# Patient Record
Sex: Female | Born: 1935 | Race: White | Hispanic: No
Health system: Southern US, Community
[De-identification: ages and names within clinical notes are randomized; demographics above are authoritative.]

## PROBLEM LIST (undated history)

## (undated) DIAGNOSIS — E785 Hyperlipidemia, unspecified: Secondary | ICD-10-CM

## (undated) DIAGNOSIS — C3490 Malignant neoplasm of unspecified part of unspecified bronchus or lung: Secondary | ICD-10-CM

## (undated) DIAGNOSIS — I4891 Unspecified atrial fibrillation: Secondary | ICD-10-CM

## (undated) DIAGNOSIS — I499 Cardiac arrhythmia, unspecified: Secondary | ICD-10-CM

## (undated) HISTORY — PX: THYROID SURGERY: SHX805

## (undated) HISTORY — DX: Hyperlipidemia, unspecified: E78.5

## (undated) HISTORY — PX: HEMORROIDECTOMY: SUR656

## (undated) HISTORY — DX: Unspecified atrial fibrillation: I48.91

## (undated) HISTORY — PX: INGUINAL HERNIA REPAIR: SHX194

## (undated) HISTORY — DX: Malignant neoplasm of unspecified part of unspecified bronchus or lung: C34.90

---

## 2003-08-20 ENCOUNTER — Observation Stay (HOSPITAL_COMMUNITY): Admission: RE | Admit: 2003-08-20 | Discharge: 2003-08-21 | Payer: Self-pay | Admitting: General Surgery

## 2004-10-07 ENCOUNTER — Ambulatory Visit: Payer: Self-pay | Admitting: Cardiology

## 2004-10-18 ENCOUNTER — Ambulatory Visit: Payer: Self-pay | Admitting: Cardiology

## 2006-10-02 ENCOUNTER — Other Ambulatory Visit: Admission: RE | Admit: 2006-10-02 | Discharge: 2006-10-02 | Payer: Self-pay | Admitting: Family Medicine

## 2007-02-05 ENCOUNTER — Ambulatory Visit (HOSPITAL_COMMUNITY): Admission: RE | Admit: 2007-02-05 | Discharge: 2007-02-05 | Payer: Self-pay | Admitting: Family Medicine

## 2007-03-26 ENCOUNTER — Ambulatory Visit: Payer: Self-pay | Admitting: Internal Medicine

## 2007-07-11 ENCOUNTER — Ambulatory Visit (HOSPITAL_COMMUNITY): Admission: RE | Admit: 2007-07-11 | Discharge: 2007-07-11 | Payer: Self-pay | Admitting: Family Medicine

## 2007-07-12 ENCOUNTER — Telehealth (INDEPENDENT_AMBULATORY_CARE_PROVIDER_SITE_OTHER): Payer: Self-pay | Admitting: *Deleted

## 2007-07-13 ENCOUNTER — Ambulatory Visit: Payer: Self-pay | Admitting: Internal Medicine

## 2007-07-13 LAB — CONVERTED CEMR LAB
ALT: 18 units/L (ref 0–35)
AST: 21 units/L (ref 0–37)
Albumin: 4 g/dL (ref 3.5–5.2)
Bilirubin, Direct: 0.1 mg/dL (ref 0.0–0.3)
Calcium: 10.1 mg/dL (ref 8.4–10.5)
Chloride: 102 meq/L (ref 96–112)
Creatinine, Ser: 0.6 mg/dL (ref 0.4–1.2)
Eosinophils Absolute: 0.1 10*3/uL (ref 0.0–0.6)
Eosinophils Relative: 2.2 % (ref 0.0–5.0)
GFR calc non Af Amer: 101 mL/min
Glucose, Bld: 101 mg/dL — ABNORMAL HIGH (ref 70–99)
HCT: 39.5 % (ref 36.0–46.0)
INR: 0.9 (ref 0.8–1.0)
MCV: 92 fL (ref 78.0–100.0)
Platelets: 203 10*3/uL (ref 150–400)
Prothrombin Time: 11.4 s (ref 10.9–13.3)
RBC: 4.29 M/uL (ref 3.87–5.11)
RDW: 12.8 % (ref 11.5–14.6)
Total Bilirubin: 0.8 mg/dL (ref 0.3–1.2)
WBC: 6.8 10*3/uL (ref 4.5–10.5)

## 2007-07-24 ENCOUNTER — Ambulatory Visit (HOSPITAL_COMMUNITY): Admission: RE | Admit: 2007-07-24 | Discharge: 2007-07-24 | Payer: Self-pay | Admitting: Internal Medicine

## 2007-07-24 ENCOUNTER — Encounter (INDEPENDENT_AMBULATORY_CARE_PROVIDER_SITE_OTHER): Payer: Self-pay | Admitting: Diagnostic Radiology

## 2007-07-24 DIAGNOSIS — C3410 Malignant neoplasm of upper lobe, unspecified bronchus or lung: Secondary | ICD-10-CM

## 2007-07-28 DIAGNOSIS — K449 Diaphragmatic hernia without obstruction or gangrene: Secondary | ICD-10-CM | POA: Insufficient documentation

## 2007-07-28 DIAGNOSIS — J309 Allergic rhinitis, unspecified: Secondary | ICD-10-CM | POA: Insufficient documentation

## 2007-07-31 DIAGNOSIS — K5730 Diverticulosis of large intestine without perforation or abscess without bleeding: Secondary | ICD-10-CM | POA: Insufficient documentation

## 2007-07-31 DIAGNOSIS — D500 Iron deficiency anemia secondary to blood loss (chronic): Secondary | ICD-10-CM

## 2007-07-31 DIAGNOSIS — M818 Other osteoporosis without current pathological fracture: Secondary | ICD-10-CM

## 2007-08-06 ENCOUNTER — Telehealth: Payer: Self-pay | Admitting: Internal Medicine

## 2007-08-07 ENCOUNTER — Ambulatory Visit: Payer: Self-pay | Admitting: Internal Medicine

## 2007-08-07 LAB — CONVERTED CEMR LAB
Eosinophils Absolute: 0.2 10*3/uL (ref 0.0–0.6)
Eosinophils Relative: 2.4 % (ref 0.0–5.0)
GFR calc Af Amer: 103 mL/min
GFR calc non Af Amer: 85 mL/min
Glucose, Bld: 99 mg/dL (ref 70–99)
HCT: 37.8 % (ref 36.0–46.0)
Lymphocytes Relative: 33.9 % (ref 12.0–46.0)
MCHC: 34.8 g/dL (ref 30.0–36.0)
MCV: 90.3 fL (ref 78.0–100.0)
Neutro Abs: 3.6 10*3/uL (ref 1.4–7.7)
Neutrophils Relative %: 54.3 % (ref 43.0–77.0)
Potassium: 4.1 meq/L (ref 3.5–5.1)
Sodium: 140 meq/L (ref 135–145)
WBC: 6.8 10*3/uL (ref 4.5–10.5)

## 2007-08-13 ENCOUNTER — Ambulatory Visit (HOSPITAL_COMMUNITY): Admission: RE | Admit: 2007-08-13 | Discharge: 2007-08-13 | Payer: Self-pay | Admitting: Internal Medicine

## 2007-08-15 ENCOUNTER — Ambulatory Visit: Payer: Self-pay | Admitting: Internal Medicine

## 2007-08-23 ENCOUNTER — Ambulatory Visit: Payer: Self-pay | Admitting: Thoracic Surgery

## 2007-08-31 ENCOUNTER — Ambulatory Visit: Admission: RE | Admit: 2007-08-31 | Discharge: 2007-10-11 | Payer: Self-pay | Admitting: Radiation Oncology

## 2007-09-03 ENCOUNTER — Encounter: Payer: Self-pay | Admitting: Internal Medicine

## 2007-09-11 ENCOUNTER — Inpatient Hospital Stay (HOSPITAL_COMMUNITY): Admission: RE | Admit: 2007-09-11 | Discharge: 2007-09-17 | Payer: Self-pay | Admitting: Thoracic Surgery

## 2007-09-11 ENCOUNTER — Encounter: Payer: Self-pay | Admitting: Internal Medicine

## 2007-09-11 ENCOUNTER — Ambulatory Visit: Payer: Self-pay | Admitting: Thoracic Surgery

## 2007-09-11 ENCOUNTER — Encounter: Payer: Self-pay | Admitting: Thoracic Surgery

## 2007-09-11 DIAGNOSIS — C3490 Malignant neoplasm of unspecified part of unspecified bronchus or lung: Secondary | ICD-10-CM

## 2007-09-11 HISTORY — PX: LUNG LOBECTOMY: SHX167

## 2007-09-11 HISTORY — DX: Malignant neoplasm of unspecified part of unspecified bronchus or lung: C34.90

## 2007-09-20 ENCOUNTER — Encounter: Admission: RE | Admit: 2007-09-20 | Discharge: 2007-09-20 | Payer: Self-pay | Admitting: Thoracic Surgery

## 2007-09-20 ENCOUNTER — Ambulatory Visit: Payer: Self-pay | Admitting: Thoracic Surgery

## 2007-10-04 ENCOUNTER — Encounter: Admission: RE | Admit: 2007-10-04 | Discharge: 2007-10-04 | Payer: Self-pay | Admitting: Thoracic Surgery

## 2007-10-04 ENCOUNTER — Ambulatory Visit: Payer: Self-pay | Admitting: Thoracic Surgery

## 2007-10-04 ENCOUNTER — Encounter: Payer: Self-pay | Admitting: Internal Medicine

## 2007-10-05 ENCOUNTER — Ambulatory Visit: Payer: Self-pay | Admitting: Internal Medicine

## 2007-10-09 LAB — CBC WITH DIFFERENTIAL/PLATELET
BASO%: 0.4 % (ref 0.0–2.0)
Basophils Absolute: 0 10e3/uL (ref 0.0–0.1)
EOS%: 9.1 % — ABNORMAL HIGH (ref 0.0–7.0)
Eosinophils Absolute: 0.8 10e3/uL — ABNORMAL HIGH (ref 0.0–0.5)
HCT: 36.6 % (ref 34.8–46.6)
HGB: 12.6 g/dL (ref 11.6–15.9)
LYMPH%: 15.1 % (ref 14.0–48.0)
MCH: 30.9 pg (ref 26.0–34.0)
MCHC: 34.5 g/dL (ref 32.0–36.0)
MCV: 89.5 fL (ref 81.0–101.0)
MONO#: 0.7 10e3/uL (ref 0.1–0.9)
MONO%: 7.6 % (ref 0.0–13.0)
NEUT#: 6.1 10e3/uL (ref 1.5–6.5)
NEUT%: 67.8 % (ref 39.6–76.8)
Platelets: 253 10e3/uL (ref 145–400)
RBC: 4.09 10e6/uL (ref 3.70–5.32)
RDW: 13.6 % (ref 11.3–14.5)
WBC: 8.9 10e3/uL (ref 3.9–10.0)
lymph#: 1.3 10e3/uL (ref 0.9–3.3)

## 2007-10-09 LAB — COMPREHENSIVE METABOLIC PANEL
ALT: <8 U/L (ref 0–35)
AST: 9 U/L (ref 0–37)
Albumin: 3.5 g/dL (ref 3.5–5.2)
Alkaline Phosphatase: 81 U/L (ref 39–117)
BUN: 10 mg/dL (ref 6–23)
Calcium: 8.9 mg/dL (ref 8.4–10.5)
Chloride: 102 mEq/L (ref 96–112)
Potassium: 4.1 mEq/L (ref 3.5–5.3)
Sodium: 136 mEq/L (ref 135–145)
Total Protein: 6.2 g/dL (ref 6.0–8.3)

## 2007-11-01 ENCOUNTER — Ambulatory Visit: Payer: Self-pay | Admitting: Thoracic Surgery

## 2007-11-01 ENCOUNTER — Encounter: Admission: RE | Admit: 2007-11-01 | Discharge: 2007-11-01 | Payer: Self-pay | Admitting: Thoracic Surgery

## 2007-12-12 ENCOUNTER — Ambulatory Visit: Payer: Self-pay | Admitting: Thoracic Surgery

## 2007-12-12 ENCOUNTER — Encounter: Admission: RE | Admit: 2007-12-12 | Discharge: 2007-12-12 | Payer: Self-pay | Admitting: Thoracic Surgery

## 2008-01-31 ENCOUNTER — Encounter: Admission: RE | Admit: 2008-01-31 | Discharge: 2008-01-31 | Payer: Self-pay | Admitting: Thoracic Surgery

## 2008-01-31 ENCOUNTER — Ambulatory Visit: Payer: Self-pay | Admitting: Thoracic Surgery

## 2008-04-01 ENCOUNTER — Ambulatory Visit: Payer: Self-pay | Admitting: Internal Medicine

## 2008-04-03 LAB — CBC WITH DIFFERENTIAL/PLATELET
Basophils Absolute: 0 10*3/uL (ref 0.0–0.1)
EOS%: 4.6 % (ref 0.0–7.0)
Eosinophils Absolute: 0.3 10*3/uL (ref 0.0–0.5)
HGB: 13 g/dL (ref 11.6–15.9)
NEUT#: 3.5 10*3/uL (ref 1.5–6.5)
RBC: 4.17 10*6/uL (ref 3.70–5.32)
RDW: 14.5 % (ref 11.3–14.5)
lymph#: 2.3 10*3/uL (ref 0.9–3.3)

## 2008-04-03 LAB — COMPREHENSIVE METABOLIC PANEL
AST: 15 U/L (ref 0–37)
Albumin: 4.1 g/dL (ref 3.5–5.2)
BUN: 12 mg/dL (ref 6–23)
Calcium: 9.4 mg/dL (ref 8.4–10.5)
Chloride: 103 mEq/L (ref 96–112)
Glucose, Bld: 96 mg/dL (ref 70–99)
Potassium: 4 mEq/L (ref 3.5–5.3)
Sodium: 137 mEq/L (ref 135–145)
Total Protein: 6.8 g/dL (ref 6.0–8.3)

## 2008-04-07 ENCOUNTER — Ambulatory Visit (HOSPITAL_COMMUNITY): Admission: RE | Admit: 2008-04-07 | Discharge: 2008-04-07 | Payer: Self-pay | Admitting: Internal Medicine

## 2008-04-16 ENCOUNTER — Ambulatory Visit: Payer: Self-pay | Admitting: Thoracic Surgery

## 2008-04-24 ENCOUNTER — Ambulatory Visit (HOSPITAL_COMMUNITY): Admission: RE | Admit: 2008-04-24 | Discharge: 2008-04-24 | Payer: Self-pay | Admitting: Internal Medicine

## 2008-05-21 ENCOUNTER — Encounter: Admission: RE | Admit: 2008-05-21 | Discharge: 2008-05-21 | Payer: Self-pay | Admitting: Internal Medicine

## 2008-05-21 ENCOUNTER — Encounter (INDEPENDENT_AMBULATORY_CARE_PROVIDER_SITE_OTHER): Payer: Self-pay | Admitting: Interventional Radiology

## 2008-05-21 ENCOUNTER — Other Ambulatory Visit: Admission: RE | Admit: 2008-05-21 | Discharge: 2008-05-21 | Payer: Self-pay | Admitting: Interventional Radiology

## 2008-07-08 ENCOUNTER — Encounter: Payer: Self-pay | Admitting: Internal Medicine

## 2008-08-19 ENCOUNTER — Ambulatory Visit (HOSPITAL_COMMUNITY): Admission: RE | Admit: 2008-08-19 | Discharge: 2008-08-20 | Payer: Self-pay | Admitting: General Surgery

## 2008-08-19 ENCOUNTER — Encounter (INDEPENDENT_AMBULATORY_CARE_PROVIDER_SITE_OTHER): Payer: Self-pay | Admitting: General Surgery

## 2008-10-01 ENCOUNTER — Ambulatory Visit: Payer: Self-pay | Admitting: Internal Medicine

## 2008-10-03 ENCOUNTER — Ambulatory Visit (HOSPITAL_COMMUNITY): Admission: RE | Admit: 2008-10-03 | Discharge: 2008-10-03 | Payer: Self-pay | Admitting: Internal Medicine

## 2008-10-03 LAB — CBC WITH DIFFERENTIAL/PLATELET
BASO%: 0.3 % (ref 0.0–2.0)
EOS%: 1 % (ref 0.0–7.0)
HCT: 41 % (ref 34.8–46.6)
LYMPH%: 30.5 % (ref 14.0–49.7)
MCH: 31.3 pg (ref 25.1–34.0)
MCHC: 34 g/dL (ref 31.5–36.0)
NEUT%: 60.8 % (ref 38.4–76.8)
Platelets: 214 10*3/uL (ref 145–400)

## 2008-10-03 LAB — COMPREHENSIVE METABOLIC PANEL
Albumin: 4.4 g/dL (ref 3.5–5.2)
BUN: 10 mg/dL (ref 6–23)
CO2: 26 mEq/L (ref 19–32)
Calcium: 9.6 mg/dL (ref 8.4–10.5)
Chloride: 104 mEq/L (ref 96–112)
Glucose, Bld: 94 mg/dL (ref 70–99)
Potassium: 4.1 mEq/L (ref 3.5–5.3)

## 2008-10-07 ENCOUNTER — Ambulatory Visit: Payer: Self-pay | Admitting: Thoracic Surgery

## 2009-04-02 ENCOUNTER — Ambulatory Visit: Payer: Self-pay | Admitting: Internal Medicine

## 2009-04-06 ENCOUNTER — Ambulatory Visit (HOSPITAL_COMMUNITY): Admission: RE | Admit: 2009-04-06 | Discharge: 2009-04-06 | Payer: Self-pay | Admitting: Internal Medicine

## 2009-04-06 LAB — COMPREHENSIVE METABOLIC PANEL
ALT: 12 U/L (ref 0–35)
AST: 18 U/L (ref 0–37)
Calcium: 9 mg/dL (ref 8.4–10.5)
Chloride: 108 mEq/L (ref 96–112)
Creatinine, Ser: 0.55 mg/dL (ref 0.40–1.20)
Sodium: 141 mEq/L (ref 135–145)
Total Protein: 6.3 g/dL (ref 6.0–8.3)

## 2009-04-06 LAB — CBC WITH DIFFERENTIAL/PLATELET
BASO%: 0.6 % (ref 0.0–2.0)
EOS%: 1.7 % (ref 0.0–7.0)
HCT: 38.5 % (ref 34.8–46.6)
MCH: 31.1 pg (ref 25.1–34.0)
MCHC: 34.5 g/dL (ref 31.5–36.0)
NEUT%: 57.3 % (ref 38.4–76.8)
RBC: 4.27 10*6/uL (ref 3.70–5.45)
RDW: 13.7 % (ref 11.2–14.5)
WBC: 5.2 10*3/uL (ref 3.9–10.3)
lymph#: 1.7 10*3/uL (ref 0.9–3.3)

## 2009-04-14 ENCOUNTER — Ambulatory Visit: Payer: Self-pay | Admitting: Thoracic Surgery

## 2009-09-15 ENCOUNTER — Encounter: Payer: Self-pay | Admitting: Cardiology

## 2009-09-29 ENCOUNTER — Ambulatory Visit: Payer: Self-pay | Admitting: Internal Medicine

## 2009-10-01 ENCOUNTER — Ambulatory Visit (HOSPITAL_COMMUNITY): Admission: RE | Admit: 2009-10-01 | Discharge: 2009-10-01 | Payer: Self-pay | Admitting: Internal Medicine

## 2009-10-01 LAB — CBC WITH DIFFERENTIAL/PLATELET
Basophils Absolute: 0 10*3/uL (ref 0.0–0.1)
Eosinophils Absolute: 0.1 10*3/uL (ref 0.0–0.5)
HGB: 13.8 g/dL (ref 11.6–15.9)
LYMPH%: 34.1 % (ref 14.0–49.7)
MCV: 93.7 fL (ref 79.5–101.0)
MONO#: 0.5 10*3/uL (ref 0.1–0.9)
NEUT#: 3.6 10*3/uL (ref 1.5–6.5)
Platelets: 211 10*3/uL (ref 145–400)
RBC: 4.31 10*6/uL (ref 3.70–5.45)
RDW: 13.5 % (ref 11.2–14.5)
WBC: 6.4 10*3/uL (ref 3.9–10.3)

## 2009-10-01 LAB — COMPREHENSIVE METABOLIC PANEL
Albumin: 4.5 g/dL (ref 3.5–5.2)
BUN: 10 mg/dL (ref 6–23)
CO2: 25 mEq/L (ref 19–32)
Glucose, Bld: 97 mg/dL (ref 70–99)
Potassium: 4.4 mEq/L (ref 3.5–5.3)
Sodium: 139 mEq/L (ref 135–145)
Total Protein: 7.1 g/dL (ref 6.0–8.3)

## 2010-02-03 ENCOUNTER — Encounter: Payer: Self-pay | Admitting: Cardiology

## 2010-02-05 ENCOUNTER — Ambulatory Visit: Payer: Self-pay | Admitting: Cardiology

## 2010-02-05 DIAGNOSIS — I4891 Unspecified atrial fibrillation: Secondary | ICD-10-CM

## 2010-03-17 ENCOUNTER — Ambulatory Visit: Payer: Self-pay | Admitting: Cardiology

## 2010-08-27 ENCOUNTER — Other Ambulatory Visit: Payer: Self-pay | Admitting: Internal Medicine

## 2010-08-27 DIAGNOSIS — C3490 Malignant neoplasm of unspecified part of unspecified bronchus or lung: Secondary | ICD-10-CM

## 2010-08-29 ENCOUNTER — Encounter: Payer: Self-pay | Admitting: Thoracic Surgery

## 2010-09-07 NOTE — Assessment & Plan Note (Signed)
Summary: np6/ new onset of afib,/ f/u back up in [place]. pt has Charity fundraiser...   Visit Type:  Initial Consult Primary Provider:  Vernon Prey  CC:  Atrial Fibrillation.  History of Present Illness: The patient presents for evaluation of atrial fibrillation. This is a new diagnosis although I do see mention of postoperative atrial fibrillation after lung surgery a few years ago. She has never been told that she's been in this rhythm otherwise. She was noted to have this on a recent EKG. She does feel some palpitations. She feels her heart racing if she's working in her yard for instance and she has to stop what she is doing and went to calm down. She has not had any presyncope or syncope. She has chronic dyspnea since a lung resection and isn't sure that this is any worse. She does have some back pain under her left scapula which is new and exacerbated with movement. However, she has not had any chest pressure, neck or arm discomfort. She has not had any nausea vomiting or diaphoresis.  Preventive Screening-Counseling & Management  Alcohol-Tobacco     Smoking Status: never  Current Medications (verified): 1)  Symbicort 160-4.5 Mcg/act Aero (Budesonide-Formoterol Fumarate) .... 2 Puffs Two Times A Day 2)  Caltrate 600+d 600-400 Mg-Unit Tabs (Calcium Carbonate-Vitamin D) .... Take 1 Tablet By Mouth Two Times A Day 3)  Omeprazole 20 Mg Cpdr (Omeprazole) .... Take 1 Tablet By Mouth Once A Day 4)  Lipitor 10 Mg Tabs (Atorvastatin Calcium) .... Take 1/2 Tablet By Mouth Once A Day 5)  Singulair 10 Mg  Tabs (Montelukast Sodium) .... Take 1 Tablet By Mouth Once A Day 6)  Vitamin D 1000 Unit Tabs (Cholecalciferol) .... Take 1 Tablet By Mouth Two Times A Day 7)  Systane 0.4-0.3 % Soln (Polyethyl Glycol-Propyl Glycol) .... One Drop Ou Two Times A Day 8)  Sm Omega-3 Fish Oil 1200 Mg Caps (Omega-3 Fatty Acids) .... Take 2 Tablet By Mouth Two Times A Day 9)  Ra Iron 27 Mg Tabs (Ferrous Sulfate) .... Take 1 Tablet  By Mouth Once A Day 10)  Levothyroxine Sodium 50 Mcg Tabs (Levothyroxine Sodium) .... Take 1 Tablet By Mouth Once A Day 11)  Folic Acid 400 Mcg Tabs (Folic Acid) .... Take 1 Tablet By Mouth Once A Day 12)  Claritin 10 Mg Tabs (Loratadine) .... Take 1 Tablet By Mouth Once A Day As Needed 13)  Tylenol Extra Strength 500 Mg Tabs (Acetaminophen) .... As Needed 14)  Mucinex Dm 30-600 Mg Xr12h-Tab (Dextromethorphan-Guaifenesin) .... As Needed 15)  Aspir-Low 81 Mg Tbec (Aspirin) .... Take 1 Tablet By Mouth Once A Day  Allergies (verified): No Known Drug Allergies  Comments:  Nurse/Medical Assistant: The patient's medication list and allergies were reviewed with the patient and were updated in the Medication and Allergy Lists.  Past History:  Past Medical History: Last updated: 08/07/2007 Lung Cancer (07/24/2007) needle bx NSCCa RUL Allergic Rhinitis Hernia surgery R lower quad  Past Surgical History: Hernia right inguinal Thyroid nodule resected Right video-assisted thoracic surgery, Right upper lobectomy. (09/2007)      Family History: Father age 65- MVA Mother - Age 42  Review of Systems       As stated in the HPI and negative for all other systems.   Vital Signs:  Patient profile:   77 year old female Height:      60 inches Weight:      125 pounds BMI:     24.50 O2  Sat:      99 % on Room air Pulse rate:   83 / minute BP sitting:   132 / 76  (left arm) Cuff size:   regular  Vitals Entered By: Carlye Grippe (February 05, 2010 2:42 PM)  O2 Flow:  Room air CC: Atrial Fibrillation   Physical Exam  General:  Well developed, well nourished, in no acute distress. Head:  normocephalic and atraumatic Eyes:  PERRLA/EOM intact; conjunctiva and lids normal. Mouth:  Poor dentition, otherwise unremarkable. Neck:  Neck supple, no JVD. No masses, thyromegaly or abnormal cervical nodes. Chest Wall:  no deformities or breast masses noted Lungs:  Clear bilaterally to auscultation  and percussion. Abdomen:  Bowel sounds positive; abdomen soft and non-tender without masses, organomegaly, or hernias noted. No hepatosplenomegaly. Msk:  Back normal, normal gait. Muscle strength and tone normal. Extremities:  No clubbing or cyanosis. Neurologic:  Alert and oriented x 3. Skin:  Intact without lesions or rashes. Cervical Nodes:  no significant adenopathy Axillary Nodes:  no significant adenopathy Inguinal Nodes:  no significant adenopathy Psych:  Normal affect.   Detailed Cardiovascular Exam  Neck    Carotids: Carotids full and equal bilaterally without bruits.      Neck Veins: Normal, no JVD.    Heart    Inspection: no deformities or lifts noted.      Auscultation: S1 and S2 within normal limits, no S3,  no clicks, no rubs, no murmurs, irregular  Vascular    Abdominal Aorta: no palpable masses, pulsations, or audible bruits.      Femoral Pulses: normal femoral pulses bilaterally.      Pedal Pulses: normal pedal pulses bilaterally.      Radial Pulses: normal radial pulses bilaterally.      Peripheral Circulation: no clubbing, cyanosis, or edema noted with normal capillary refill.     EKG  Procedure date:  03/05/2010  Findings:      Atrial fibrillation, axis within normal limits, intervals within normal limits, rate 92  Impression & Recommendations:  Problem # 1:  ATRIAL FIBRILLATION (ICD-427.31) The patient has this rhythm and does seem to feel it. I have discussed with her and her daughter at great length the risk of stroke and the need for anticoagulation and I will start her on Coumadin. She will get 2.5 mg every night and be checked on Tuesday at [facility]. I will plan cardioversion after she has had therapeutic INRs for 4 weeks in a row.  I will also apply a 48 hour Holter monitor to make sure this is persistent not paroxysmal fibrillation. Orders: Holter Monitor (Holter Monitor)  Problem # 2:  Sx of DYSPNEA (ICD-786.05)  I suspect her  dyspnea is related to her lung resection but could not exclude a contribution from atrial fibrillation.  The following medications were removed from the medication list:    Aspir-low 81 Mg Tbec (Aspirin) .[name]... 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  Patient Instructions: 1)  Follow up with Dr. Antoine Poche in [place] on March 17, 2010 at 2:45pm. 2)  Start Warfarin 2.5mg  today. Take 1 tablet every afternoon around 4-5 pm. 3)  Go do Dr. Kathi Der office for a coumadin check on Tuesday, July 5th at 8:30am with Montey Hora. 4)  Stop Aspirin (if you take this). 5)  Your physician has recommended that you wear a holter monitor.  Holter monitors are medical devices that record the heart's electrical activity. Doctors most often use these monitors to diagnose arrhythmias. Arrhythmias are problems with the speed or rhythm of the heartbeat. The monitor is a small, portable device. You can wear one while you do your normal daily activities. This is usually used to diagnose what is causing palpitations/syncope (passing out). GO TO DR. Kathi Der OFFICE ON JULY 19TH TO HAVE THIS PLACED. GO SOMETIME BETWEEN 9:30 AT 12. Prescriptions: WARFARIN SODIUM 2.5 MG TABS (WARFARIN SODIUM) Use as directed by Anticoagualtion Clinic with Dr. Christell Constant  #45 x 1   Entered by:   Cyril Loosen, RN, BSN   Authorized by:   Rollene Rotunda, MD, [facility]   Signed by:   Cyril Loosen, RN, BSN on 02/05/2010   Method used:   Faxed to ...       Hospital doctor (retail)       [address]       [place], [place]  [address]       Ph: [phone] or [phone]       Fax: [phone]   RxID:   [id number] WARFARIN SODIUM 2.5 MG TABS (WARFARIN SODIUM) Use as directed by Anticoagualtion Clinic with Dr. Christell Constant  #45 x 0   Entered by:   Cyril Loosen, RN, BSN    Authorized by:   Rollene Rotunda, MD, [facility]   Signed by:   Cyril Loosen, RN, BSN on 02/05/2010   Method used:   Print then Give to Patient   RxID:   [id number]   Handout requested.  She is instructed to discontinue the ASA I have reviewed and approved all prescriptions at the time of this visit. Rollene Rotunda, MD, [facility]  February 05, 2010 3:58 PM

## 2010-09-07 NOTE — Assessment & Plan Note (Signed)
Summary: [facility]   Visit Type:  Follow-up Primary Provider:  Dr. Vernon Prey  CC:  Atrial Fibrillation.  History of Present Illness: The patient presents for followup of atrial fibrillation. Since I last saw her she wore a Holter monitor which demonstrated that her average heart rate was in the 90s. However, when she is up and walking her ventricular rate is sometimes in the 140s 150s. She does not notice her persistent atrial fibrillation. She is not having any presyncope or syncope. She is not having any chest pressure, neck or arm discomfort. She is having occasional shortness of breath and wonders if this could be related to the hot weather. She denies any chest pressure, neck or arm discomfort. She is tolerating Coumadin.  Current Medications (verified): 1)  Symbicort 160-4.5 Mcg/act Aero (Budesonide-Formoterol Fumarate) .... 2 Puffs Two Times A Day 2)  Caltrate 600+d 600-400 Mg-Unit Tabs (Calcium Carbonate-Vitamin D) .... Take 1 Tablet By Mouth Two Times A Day 3)  Omeprazole 20 Mg Cpdr (Omeprazole) .... Take 1 Tablet By Mouth Once A Day 4)  Lipitor 10 Mg Tabs (Atorvastatin Calcium) .... Take 1/2 Tablet By Mouth Once A Day 5)  Singulair 10 Mg  Tabs (Montelukast Sodium) .... Take 1 Tablet By Mouth Once A Day 6)  Vitamin D 1000 Unit Tabs (Cholecalciferol) .... Take 1 Tablet By Mouth Two Times A Day 7)  Systane 0.4-0.3 % Soln (Polyethyl Glycol-Propyl Glycol) .... One Drop Ou Two Times A Day 8)  Sm Omega-3 Fish Oil 1200 Mg Caps (Omega-3 Fatty Acids) .... Take 2 Tablet By Mouth Two Times A Day 9)  Ra Iron 27 Mg Tabs (Ferrous Sulfate) .... Take 1 Tablet By Mouth Once A Day 10)  Levothyroxine Sodium 50 Mcg Tabs (Levothyroxine Sodium) .... Take 1 Tablet By Mouth Once A Day 11)  Folic Acid 400 Mcg Tabs (Folic Acid) .... Take 1 Tablet By Mouth Once A Day 12)  Claritin 10 Mg Tabs (Loratadine) .... Take 1 Tablet By Mouth Once A Day As Needed 13)  Tylenol Extra Strength 500 Mg Tabs  (Acetaminophen) .... As Needed 14)  Mucinex Dm 30-600 Mg Xr12h-Tab (Dextromethorphan-Guaifenesin) .... As Needed 15)  Warfarin Sodium 2.5 Mg Tabs (Warfarin Sodium) .... Use As Directed By Anticoagualtion Clinic With Dr. Christell Constant  Allergies (verified): No Known Drug Allergies  Past History:  Past Medical History: Lung Cancer (07/24/2007) needle bx NSCCa RUL Allergic Rhinitis Hernia surgery R lower quad Atrial Fibrillation  Review of Systems       As stated in the HPI and negative for all other systems.   Vital Signs:  Patient profile:   77 year old female Height:      60 inches Weight:      126 pounds BMI:     24.70 Pulse rate:   78 / minute Resp:     18 per minute BP sitting:   108 / 66  (right arm)  Vitals Entered By: Marrion Coy, CNA (March 17, 2010 2:30 PM)  Physical Exam  General:  Well developed, well nourished, in no acute distress. Head:  normocephalic and atraumatic Eyes:  PERRLA/EOM intact; conjunctiva and lids normal. Mouth:  Poor dentition, otherwise unremarkable. Neck:  Neck supple, no JVD. No masses, thyromegaly or abnormal cervical nodes. Chest Wall:  no deformities or breast masses noted Lungs:  Clear bilaterally to auscultation and percussion. Abdomen:  Bowel sounds positive; abdomen soft and non-tender without masses, organomegaly, or hernias noted. No hepatosplenomegaly. Msk:  Back normal, normal gait. Muscle  strength and tone normal. Extremities:  No clubbing or cyanosis. Neurologic:  Alert and oriented x 3. Skin:  Intact without lesions or rashes. Cervical Nodes:  no significant adenopathy Axillary Nodes:  no significant adenopathy Inguinal Nodes:  no significant adenopathy Psych:  Normal affect.   Detailed Cardiovascular Exam  Neck    Carotids: Carotids full and equal bilaterally without bruits.      Neck Veins: Normal, no JVD.    Heart    Inspection: no deformities or lifts noted.      Auscultation: S1 and S2 within normal limits, no  S3,  no clicks, no rubs, no murmurs, irregular  Vascular    Abdominal Aorta: no palpable masses, pulsations, or audible bruits.      Femoral Pulses: normal femoral pulses bilaterally.      Pedal Pulses: normal pedal pulses bilaterally.      Radial Pulses: normal radial pulses bilaterally.      Peripheral Circulation: no clubbing, cyanosis, or edema noted with normal capillary refill.     Impression & Recommendations:  Problem # 1:  ATRIAL FIBRILLATION (ICD-427.31) She is tolerating anticoagulation. I am going to add a very low dose of metoprolol 25 mg daily as her heart rate with activities is slightly higher than I would like.  Problem # 2:  Sx of DYSPNEA (ICD-786.05) She reports dyspnea. It is unclear that this is related to fibrillation. It seems to be mild and sporadic and may be related to the summer heat. I will defer further workup or treatment until the fall and pursue this if her symptoms persist.  Problem # 3:  LUNG CANCER, UPPER LOBE (ICD-162.3) She has yearly followup. Her previous lobectomy could also certainly explain some of her dyspnea.  Patient Instructions: 1)  Your physician recommends that you schedule a follow-up appointment in: 6 months with Dr Antoine Poche 2)  Your physician has recommended you make the following change in your medication: start Metoprolol succinate 25 mg a day Prescriptions: METOPROLOL SUCCINATE 25 MG XR24H-TAB (METOPROLOL SUCCINATE) one daily  #90 x 3   Entered by:   Charolotte Capuchin, RN   Authorized by:   Rollene Rotunda, MD, [facility]   Signed by:   Charolotte Capuchin, RN on 03/17/2010   Method used:   Faxed to ...       Hospital doctor (retail)       [address]       [place], [place]  [address]       Ph: [phone] or [phone]       Fax: [phone]   RxID:   [id number]

## 2010-09-07 NOTE — Letter (Signed)
Summary: External Correspondence/ OFFICE VISIT [facility]  External Correspondence/ OFFICE VISIT [facility]   Imported By: Dorise Hiss 02/04/2010 14:05:48  _____________________________________________________________________  External Attachment:    Type:   Image     Comment:   External Document

## 2010-10-04 ENCOUNTER — Other Ambulatory Visit: Payer: Self-pay | Admitting: Internal Medicine

## 2010-10-04 ENCOUNTER — Ambulatory Visit (HOSPITAL_COMMUNITY)
Admission: RE | Admit: 2010-10-04 | Discharge: 2010-10-04 | Disposition: A | Discharge status: Home / Self Care | Payer: MEDICARE | Source: Ambulatory Visit | Attending: Internal Medicine | Admitting: Internal Medicine

## 2010-10-04 ENCOUNTER — Encounter (HOSPITAL_BASED_OUTPATIENT_CLINIC_OR_DEPARTMENT_OTHER): Payer: MEDICARE | Admitting: Internal Medicine

## 2010-10-04 ENCOUNTER — Encounter (HOSPITAL_COMMUNITY): Payer: Self-pay

## 2010-10-04 DIAGNOSIS — I2510 Atherosclerotic heart disease of native coronary artery without angina pectoris: Secondary | ICD-10-CM | POA: Insufficient documentation

## 2010-10-04 DIAGNOSIS — K449 Diaphragmatic hernia without obstruction or gangrene: Secondary | ICD-10-CM | POA: Insufficient documentation

## 2010-10-04 DIAGNOSIS — C3490 Malignant neoplasm of unspecified part of unspecified bronchus or lung: Secondary | ICD-10-CM

## 2010-10-04 DIAGNOSIS — C3410 Malignant neoplasm of upper lobe, unspecified bronchus or lung: Secondary | ICD-10-CM

## 2010-10-04 DIAGNOSIS — Z85118 Personal history of other malignant neoplasm of bronchus and lung: Secondary | ICD-10-CM | POA: Insufficient documentation

## 2010-10-04 DIAGNOSIS — Z483 Aftercare following surgery for neoplasm: Secondary | ICD-10-CM | POA: Insufficient documentation

## 2010-10-04 LAB — CMP (CANCER CENTER ONLY)
ALT(SGPT): 16 U/L (ref 10–47)
AST: 22 U/L (ref 11–38)
Alkaline Phosphatase: 52 U/L (ref 26–84)
Creat: 0.6 mg/dl (ref 0.6–1.2)
Sodium: 140 mEq/L (ref 128–145)
Total Bilirubin: 1 mg/dl (ref 0.20–1.60)
Total Protein: 6.6 g/dL (ref 6.4–8.1)

## 2010-10-04 LAB — CBC WITH DIFFERENTIAL/PLATELET
Eosinophils Absolute: 0.1 10*3/uL (ref 0.0–0.5)
HGB: 13 g/dL (ref 11.6–15.9)
MONO#: 0.4 10*3/uL (ref 0.1–0.9)
MONO%: 6.6 % (ref 0.0–14.0)
NEUT#: 4 10*3/uL (ref 1.5–6.5)
RBC: 4.22 10*6/uL (ref 3.70–5.45)
RDW: 14.2 % (ref 11.2–14.5)
WBC: 6.3 10*3/uL (ref 3.9–10.3)
lymph#: 1.7 10*3/uL (ref 0.9–3.3)

## 2010-10-04 MED ORDER — IOHEXOL 300 MG/ML  SOLN
80.0000 mL | Freq: Once | INTRAMUSCULAR | Status: AC | PRN
  Administered 2010-10-04: 80 mL via INTRAVENOUS

## 2010-10-07 ENCOUNTER — Encounter: Payer: MEDICARE | Admitting: Internal Medicine

## 2010-10-07 ENCOUNTER — Other Ambulatory Visit: Payer: Self-pay | Admitting: Internal Medicine

## 2010-10-07 ENCOUNTER — Encounter (HOSPITAL_BASED_OUTPATIENT_CLINIC_OR_DEPARTMENT_OTHER): Payer: MEDICARE | Admitting: Internal Medicine

## 2010-10-07 DIAGNOSIS — C3410 Malignant neoplasm of upper lobe, unspecified bronchus or lung: Secondary | ICD-10-CM

## 2010-10-07 DIAGNOSIS — Z85118 Personal history of other malignant neoplasm of bronchus and lung: Secondary | ICD-10-CM

## 2010-10-07 LAB — BASIC METABOLIC PANEL
BUN: 9 mg/dL (ref 6–23)
CO2: 27 mEq/L (ref 19–32)
Calcium: 9 mg/dL (ref 8.4–10.5)
Glucose, Bld: 117 mg/dL — ABNORMAL HIGH (ref 70–99)
Sodium: 137 mEq/L (ref 135–145)

## 2010-10-20 ENCOUNTER — Ambulatory Visit (INDEPENDENT_AMBULATORY_CARE_PROVIDER_SITE_OTHER): Payer: Medicare Other | Admitting: Cardiology

## 2010-10-20 ENCOUNTER — Encounter: Payer: Self-pay | Admitting: Cardiology

## 2010-10-20 DIAGNOSIS — I4891 Unspecified atrial fibrillation: Secondary | ICD-10-CM

## 2010-10-20 DIAGNOSIS — R0602 Shortness of breath: Secondary | ICD-10-CM

## 2010-10-26 NOTE — Assessment & Plan Note (Signed)
Summary: FOLLOW UP - 6 MONTHS   Visit Type:  Follow-up Primary Provider:  Dr. Vernon Prey  CC:  Atrial Fibrillation.  History of Present Illness: The patient presents for followup of atrial fibrillation. At the last appointment I had a very low dose of beta blocker as a monitor and demonstrated increased heart rates with minimal activity. The patient has tolerated this medication. However, she is still feeling her heart racing if she gets out and walks a short moderate distance on level ground. She is not describing resting shortness of breath, PND or orthopnea. She does not describe any chest pressure, neck or arm discomfort. She does report dizziness if she bends over to pick something up. However, she's not had any presyncope or syncope. She points out as well multiple ecchymosis and doesn't recall trauma in these areas. However, these are clearly in contact area slight pretibial and her hands.  Current Medications (verified): 1)  Symbicort 160-4.5 Mcg/act Aero (Budesonide-Formoterol Fumarate) .... 2 Puffs Two Times A Day 2)  Caltrate 600+d 600-400 Mg-Unit Tabs (Calcium Carbonate-Vitamin D) .... Take 1 Tablet By Mouth Two Times A Day 3)  Omeprazole 20 Mg Cpdr (Omeprazole) .... Take 1 Tablet By Mouth Once A Day 4)  Lipitor 10 Mg Tabs (Atorvastatin Calcium) .... Take 1/2 Tablet By Mouth Once A Day 5)  Singulair 10 Mg  Tabs (Montelukast Sodium) .... Take 1 Tablet By Mouth Once A Day 6)  Vitamin D 1000 Unit Tabs (Cholecalciferol) .... Take 1 Tablet By Mouth Two Times A Day 7)  Systane 0.4-0.3 % Soln (Polyethyl Glycol-Propyl Glycol) .... One Drop Ou Two Times A Day 8)  Sm Omega-3 Fish Oil 1200 Mg Caps (Omega-3 Fatty Acids) .... Take 2 Tablet By Mouth Two Times A Day 9)  Ra Iron 27 Mg Tabs (Ferrous Sulfate) .... Take 1 Tablet By Mouth Once A Day 10)  Levothyroxine Sodium 50 Mcg Tabs (Levothyroxine Sodium) .... Take 1 Tablet By Mouth Once A Day 11)  Folic Acid 400 Mcg Tabs (Folic Acid) .... Take 1  Tablet By Mouth Once A Day 12)  Claritin 10 Mg Tabs (Loratadine) .... Take 1 Tablet By Mouth Once A Day As Needed 13)  Tylenol Extra Strength 500 Mg Tabs (Acetaminophen) .... As Needed 14)  Mucinex Dm 30-600 Mg Xr12h-Tab (Dextromethorphan-Guaifenesin) .... As Needed 15)  Warfarin Sodium 2.5 Mg Tabs (Warfarin Sodium) .... Use As Directed By Anticoagualtion Clinic With Dr. Christell Constant 16)  Metoprolol Succinate 25 Mg Xr24h-Tab (Metoprolol Succinate) .... One Daily 17)  Spiriva Handihaler 18 Mcg Caps (Tiotropium Bromide Monohydrate) .... Uad  Allergies (verified): No Known Drug Allergies  Past History:  Past Medical History: Lung Cancer (07/24/2007) needle bx NSCCa RUL Allergic Rhinitis Atrial Fibrillation  Vital Signs:  Patient profile:   77 year old female Height:      60 inches Weight:      132 pounds BMI:     25.87 Pulse rate:   85 / minute Resp:     16 per minute BP sitting:   112 / 68  (right arm)  Vitals Entered By: Marrion Coy, CNA (October 20, 2010 2:30 PM)  Physical Exam  General:  Well developed, well nourished, in no acute distress. Head:  normocephalic and atraumatic Eyes:  PERRLA/EOM intact; conjunctiva and lids normal. Mouth:  Poor dentition, otherwise unremarkable. Neck:  Neck supple, no JVD. No masses, thyromegaly or abnormal cervical nodes. Chest Wall:  no deformities or breast masses noted Lungs:  Clear bilaterally to auscultation and percussion.  Abdomen:  Bowel sounds positive; abdomen soft and non-tender without masses, organomegaly, or hernias noted. No hepatosplenomegaly. Msk:  Back normal, normal gait. Muscle strength and tone normal. Extremities:  No clubbing or cyanosis. Neurologic:  Alert and oriented x 3. Skin:  Intact without lesions or rashes. Cervical Nodes:  no significant adenopathy Inguinal Nodes:  no significant adenopathy Psych:  Normal affect.   Detailed Cardiovascular Exam  Neck    Carotids: Carotids full and equal bilaterally without  bruits.      Neck Veins: Normal, no JVD.    Heart    Inspection: no deformities or lifts noted.      Auscultation: S1 and S2 within normal limits, no S3,  no clicks, no rubs, no murmurs, irregular  Vascular    Abdominal Aorta: no palpable masses, pulsations, or audible bruits.      Femoral Pulses: normal femoral pulses bilaterally.      Pedal Pulses: normal pedal pulses bilaterally.      Radial Pulses: normal radial pulses bilaterally.      Peripheral Circulation: no clubbing, cyanosis, or edema noted with normal capillary refill.     EKG  Procedure date:  10/20/2010  Findings:      atrial fibrillation, rate 80, axis within normal limits, intervals within normal limits, no acute ST-T wave changes  Impression & Recommendations:  Problem # 1:  ATRIAL FIBRILLATION (ICD-427.31) She still reports rapid heart rates with activity. However, I walked her around the office today. Her resting oxygen level was 89% and went down to 87 with activity. Her heart rate went from 100 to resting 122. The real issue is is the hypoxemia driving the heart rate rather than the other way around. She may need oxygen though she would be reluctant to use this. I will schedule her to see Dr. Maple Hudson to further address her hypoxemia. I will also check a BNP level.  Problem # 2:  LUNG CANCER, UPPER LOBE (ICD-162.3) I will refer her back to Dr. Maple Hudson for evaluation of dyspnea.  Other Orders: Pulmonary Referral (Pulmonary)  Patient Instructions: 1)  Your physician recommends that you schedule a follow-up appointment after seeing pulmonary 2)  Your physician recommends that you continue on your current medications as directed. Please refer to the Current Medication list given to you today. 3)  You have been referred to Dr Fannie Knee  Appended Document: [facility]     Allergies: No Known Drug Allergies  Vital Signs:  Patient profile:   77 year old female O2 Sat:      89 % Pulse rate:   102 /  minute  Serial Vital Signs/Assessments:  Time      Position  BP       Pulse  Resp  Temp     By                              [address], CNA                              [address] CNA  105                   Christy Tuck, CNA                                PEF    PreRx  PostRx Time      O2 Sat  O2 Type     L/min  L/min  L/min   By           89  %                                     Marrion Coy, CNA           87  %                                     Marrion Coy, CNA           86  %                                     Marrion Coy, CNA  Comments: Before Walk Test By: Marrion Coy, CNA  During Walk Test By: Marrion Coy, CNA  After Walk Test By: Marrion Coy, CNA

## 2010-11-22 LAB — COMPREHENSIVE METABOLIC PANEL
Albumin: 3.6 g/dL (ref 3.5–5.2)
BUN: 5 mg/dL — ABNORMAL LOW (ref 6–23)
Calcium: 8.9 mg/dL (ref 8.4–10.5)
Glucose, Bld: 161 mg/dL — ABNORMAL HIGH (ref 70–99)
Potassium: 3.4 mEq/L — ABNORMAL LOW (ref 3.5–5.1)
Total Protein: 6.2 g/dL (ref 6.0–8.3)

## 2010-11-22 LAB — DIFFERENTIAL
Lymphocytes Relative: 32 % (ref 12–46)
Lymphs Abs: 1.5 10*3/uL (ref 0.7–4.0)
Monocytes Absolute: 0.4 10*3/uL (ref 0.1–1.0)
Monocytes Relative: 8 % (ref 3–12)
Neutro Abs: 2.9 10*3/uL (ref 1.7–7.7)
Neutrophils Relative %: 59 % (ref 43–77)

## 2010-11-22 LAB — CBC
HCT: 36 % (ref 36.0–46.0)
Hemoglobin: 11.9 g/dL — ABNORMAL LOW (ref 12.0–15.0)
MCHC: 33.1 g/dL (ref 30.0–36.0)
Platelets: 182 10*3/uL (ref 150–400)
RDW: 14 % (ref 11.5–15.5)

## 2010-11-22 LAB — URINALYSIS, ROUTINE W REFLEX MICROSCOPIC
Bilirubin Urine: NEGATIVE
Glucose, UA: NEGATIVE mg/dL
Nitrite: NEGATIVE
Protein, ur: NEGATIVE mg/dL
pH: 5.5 (ref 5.0–8.0)

## 2010-12-06 ENCOUNTER — Encounter: Payer: Self-pay | Admitting: Internal Medicine

## 2010-12-07 ENCOUNTER — Encounter: Payer: Self-pay | Admitting: Internal Medicine

## 2010-12-07 ENCOUNTER — Ambulatory Visit (INDEPENDENT_AMBULATORY_CARE_PROVIDER_SITE_OTHER): Payer: Medicare Other | Admitting: Internal Medicine

## 2010-12-07 VITALS — BP 114/64 | HR 92 | Ht 59.0 in | Wt 132.0 lb

## 2010-12-07 DIAGNOSIS — R0609 Other forms of dyspnea: Secondary | ICD-10-CM

## 2010-12-07 DIAGNOSIS — R0600 Dyspnea, unspecified: Secondary | ICD-10-CM

## 2010-12-07 DIAGNOSIS — D500 Iron deficiency anemia secondary to blood loss (chronic): Secondary | ICD-10-CM

## 2010-12-07 DIAGNOSIS — C3410 Malignant neoplasm of upper lobe, unspecified bronchus or lung: Secondary | ICD-10-CM

## 2010-12-07 NOTE — Patient Instructions (Signed)
Orders- Schedule PFT  And 6 MWT

## 2010-12-07 NOTE — Progress Notes (Signed)
Subjective:    Patient ID: Erica [name], female    DOB: September 15, 1920, 77 y.o.   MRN: [id number]  HPI 39 yoF seen on referral by Dr Antoine Poche. Here with daughter. I last saw her in 2008 when needle bx of a RUL nodule -> nonsmall cell cancer. This was resected by Dr Edwyna Shell with RUL lobectomy and subsequently followed by Dr Shirline Frees without XRT or Chem. No recurrence so far.  She never smoked, but her husband did. Old record reviewed, without PFT found. Recently she has been treated by Dr Antoine Poche for AFib. The question is how much of her dyspnea is pulmonary and whether her lung disease contributes to her arrythmia. She describes some chronic exertional dyspnea, worse since around the time she began coumadin. Noticed walking briskly and when lying down, relieved by rest. SOB to mailbox and back- stopping to get her breath. Breathing meds also help some. Occasional dry cough, some wheeze, and a sensation of something moving in left lower chest or upper abdomen. Known large hiatal hernia but she denies reflux. She is on warfarin, but denies anemia. CT chest 10/04/10 reviewed- S/p RUL lobectomy and Rt thyroidectomy. No mets or nodes. NAD, Nl heart size.  Review of Systems See HPI Constitutional:   No weight loss, night sweats,  Fevers, chills, fatigue, lassitude. HEENT:   No headaches,  Difficulty swallowing,  Tooth/dental problems,  Sore throat,                No sneezing, itching, ear ache, nasal congestion, post nasal drip,   CV:  No chest pain,  Orthopnea, PND, swelling in lower extremities, anasarca, dizziness, palpitations  GI  No heartburn, indigestion, abdominal pain, nausea, vomiting, diarrhea, change in bowel habits, loss of appetite  Resp: No shortness of breath with exertion or at rest.  No excess mucus, no productive cough,  No non-productive cough,  No coughing up of blood.  No change in color of mucus.  No wheezing.  No chest wall deformity  Skin: no rash or lesions.  GU: no dysuria,  change in color of urine, no urgency or frequency.  No flank pain.  MS:  No joint pain or swelling.  No decreased range of motion.  No back pain.  Psych:  No change in mood or affect. No depression or anxiety.  No memory loss.      Objective:   Physical Exam General- Alert, Oriented, Affect-appropriate, Distress- none acute    Elderly Skin- rash-none, lesions- none, excoriation- none  Lymphadenopathy- none  Head- atraumatic  Eyes- Gross vision intact, PERRLA, conjunctivae clear, secretions  Ears-  Normal for age- Hearing, canals, Tm  Nose- Clear, No-Septal dev, mucus, polyps, erosion, perforation   Throat- Mallampati II , mucosa clear , drainage- none, tonsils- atrophic  Neck- flexible , trachea midline, no stridor , thyroid scar,  No carotid no bruit  Chest - symmetrical excursion , unlabored     Heart/CV- RRR , no murmur , no gallop  , no rub, nl s1 s2                     - JVD- none , edema- none, stasis changes- none, varices- none     Lung- clear to P&A,trace end-expiratory wheeze, cough- none , dullness-none, rub- none     Chest wall- atraumatic, no scar  Abd- tender-no, distended-no, bowel sounds-present, HSM- no  Br/ Gen/ Rectal- Not done, not indicated  Extrem- cyanosis- none, clubbing, none, atrophy- none, strength- nl  Neuro- grossly intact to observation         Assessment & Plan:

## 2010-12-11 ENCOUNTER — Encounter: Payer: Self-pay | Admitting: Internal Medicine

## 2010-12-11 DIAGNOSIS — R0600 Dyspnea, unspecified: Secondary | ICD-10-CM | POA: Insufficient documentation

## 2010-12-11 NOTE — Assessment & Plan Note (Signed)
We will get PFT and 6 minute walk test. At age 77, normal range for PFT is not well established. Her subjective response to breathing meds suggests there may be room to improve.

## 2010-12-11 NOTE — Assessment & Plan Note (Signed)
She denied anemia to me- this will be watched with her warfarin.

## 2010-12-21 NOTE — Assessment & Plan Note (Signed)
OFFICE VISIT   [name], [name] B  DOB:  10/16/1920                                        Dec 12, 2007  CHART #:  [id number]   Erica [name] came for follow-up today.  Her chest x-ray showed normal  postoperative changes.  She is still having some pain in her right chest  and some pain on extending her arm.  We started on exercises for this.  She has also had a chronic cough for about a month and we put her on  Tussionex 5 mL twice a day for her cough.  Her lungs were clear to  auscultation and percussion.  Overall, she is doing reasonably well  after her surgery.  I plan to see her back again in six weeks with  another chest x-ray.   Ines Bloomer, M.D.  Electronically Signed   DPB/MEDQ  D:  12/12/2007  T:  12/12/2007  Job:  [id number]

## 2010-12-21 NOTE — Assessment & Plan Note (Signed)
OFFICE VISIT   TAJAH, NOGUCHI B  DOB:  12/18/1920                                        April 16, 2008  CHART #:  [id number]   The patient had a CT scan n 04/07/2008, which showed no evidence of  recurrence of her cancer.  Dr. Arbutus Ped ordered this and has another one  ordered in February.  Her blood pressure is 115/73, pulse 75,  respirations 18, and sats were 98%.  Dr. Arbutus Ped has requested a  ultrasound of the right thyroid lobe because of a questionable nodule.  I will see her back again in March after her next CT scan.   Ines Bloomer, M.D.  Electronically Signed   DPB/MEDQ  D:  04/16/2008  T:  04/16/2008  Job:  [id number]

## 2010-12-21 NOTE — Letter (Signed)
August 23, 2007   Erica D. Maple Hudson, MD, FCCP, FACP  [facility]  [address]  [place], [place] [address].   Re:  ANALESE, Erica B                 DOB:  19-Aug-1920   Dear Erica [name],   I saw [name] in the office today.  This patient has been treated for  chronic obstructive pulmonary disease and asthma.  On chest x-ray was  found to have a right upper lobe ground glass lesion.  Needle biopsy  revealed adenocarcinoma.  PET scan revealed that this was the only area  that was positive.  There was an indeterminate area near the posterior  trachea.  She is 77 years old, Caucasian female, and referred here for  evaluation.  Her pulmonary function tests showed an FVC of 2.58 with an  FEV1 of 1.78 and an effusion capacity of 69%.  She had a right inguinal  hernia repair in the past by Dr. Derrell Lolling in January 2005.  She has had no  hemoptysis, fever, chills, excessive sputum or weight loss.   MEDICATIONS:  Her medications include Advair 250-50 twice a day,  calcium, fish oil, iron, vitamin D, vitamin C, vitamin E, omeprazole  20  mg daily for a hiatal hernia, Lipitor 10 mg a day for dyslipidemia,  Singulair 10 mg daily, Astelin as needed and eye drops Travatan Z for  glaucoma, and ________  for allergies.  She also takes folic acid.   ALLERGIES:  She has no allergies.   PAST MEDICAL HISTORY:  Besides emphysema and dyslipidemia, no other  medical problems.   FAMILY HISTORY:  Noncontributory.   SOCIAL HISTORY:  Widowed.  Has 2 children.  Does not smoke or drink.   REVIEW OF SYSTEMS:  VITAL SIGNS:  She is 137 pounds.  She is 5 feet 1  inch.  CARDIAC:  She gets some shortness of breath with exertion.  No angina or  atrial fibrillation.  PULMONARY:  She has a history of asthma.  See history of present  illness.  GI:  She has a hiatal hernia and constipation.  GU:  No kidney disease, dysuria or frequent urination.  VASCULAR:  No claudication, DVT or TIAs.  Does  have varicose veins.  NEUROLOGICAL:  No headaches, black outs or seizures.  MUSCULOSKELETAL:  She has chronic arthritis.  No psychiatric illnesses.  EYES/ENT:  No change in her hearing.  She has glaucoma since 2006.  HEMATOLOGICAL:  She has anemia but no bleeding or clotting disorders.   PHYSICAL EXAMINATION:  She is an alert appearing Caucasian female in no  acute distress.  Blood pressure is 132/78, pulse 96, respirations 18,  saturation 98.  Head, eyes, ears, nose and throat are unremarkable.  Neck is supple without thyromegaly.  There is no supraclavicular or  axillary adenopathy.  Chest is clear bilaterally.  Heart:  Regular sinus  rhythm.  No murmurs.  Abdomen:  Soft.  No hepatosplenomegaly.  Bowel  sounds are present.  Pulses are 1+.  To no clubbing or edema.  Skin is  thin.  There are no lesions.  She has varicosities in both lower  extremities.  Neurological:  She is oriented x3.  Sensory and motor  intact.   ASSESSMENT:  I feel that Erica [name] has early lung cancer.   RECOMMENDATIONS:  Given her age, I would recommend that she have a VATS  wedge resection of this lesion with  seed implantation and node sampling.  I have tentatively scheduled this for February 3 and I will have her see  Dr. Margaretmary Bayley prior to the surgery.   I appreciate the opportunity of seeing Erica [name].   Sincerely,   Ines Bloomer, M.D.  Electronically Signed   DPB/MEDQ  D:  08/23/2007  T:  08/23/2007  Job:  [id number]   cc:   Artist Pais Kathrynn Running, M.D.

## 2010-12-21 NOTE — Op Note (Signed)
NAMESABIRIN, BARAY                 ACCOUNT NO.:  [id number]   MEDICAL RECORD NO.:  [id number]          PATIENT TYPE:  AMB   LOCATION:  DAY                          FACILITY:  [facility]   PHYSICIAN:  Angelia Mould. Derrell Lolling, M.D.DATE OF BIRTH:  03-01-1921   DATE OF PROCEDURE:  08/19/2008  DATE OF DISCHARGE:                               OPERATIVE REPORT   PREOPERATIVE DIAGNOSIS:  Right thyroid nodules.   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   OPERATION PERFORMED:  Right thyroid lobectomy, frozen section.   SURGEON:  Angelia Mould. Derrell Lolling, M.D.   ASSISTANT:  Anselm Pancoast. Zachery Dakins, M.D.   OPERATIVE INDICATIONS:  This is an 77 year old white female who  underwent a right upper lobectomy on September 11, 2007 for a stage IA non-  small cell lung cancer.  She recovered uneventfully.  She did not  require any adjuvant therapy.  She has done well.  Surveillance CT scans  showed a small thyroid nodule.  Subsequent thyroid ultrasound and biopsy  was performed with the ultrasound showing a 2-cm solid nodule in the mid  right lobe, and a second solid nodule in the mid upper lobe on the right  measuring 1.2 cm.  Fine needle aspiration cytology of the larger nodule  showed atypical follicular lesion.  She was referred because of the  possible involvement with follicular variant papillary thyroid cancer.  On exam she is a thin, elderly woman who is alert and independent.  I do  not feel a mass in the neck.  I do not feel any adenopathy in the neck.  She was counseled.  She desired to have this area removed to be sure she  did not have cancer.   OPERATIVE TECHNIQUE:  Following the induction of general endotracheal  anesthesia, the patient was placed in the modified semi-Fowler position  with the neck extended.  The neck was then prepped and draped in sterile  fashion.  The patient was identified as correct patient and correct  procedure and correct site and a  surgical checklist was carried out.  A  curved transverse incision was made in the neck about 1.5 cm above the  suprasternal notch.  Dissection was carried down through the  subcutaneous tissue and through the platysma muscle.  Skin and platysma  flaps were raised superiorly and inferiorly and a self-retaining  retractor was placed.  Strap muscles were divided in the midline and  dissected off of the right thyroid lobe.   We isolated the superior thyroid pole and isolated the superior thyroid  artery.  This was doubly ligated with 2-0 silk ties and then a metal  clip was placed superiorly for extra security and the superior pole  vessels were divided.  Inferiorly we took down several small vascular  channels using the Harmonic scalpel, mobilizing the inferior pole.  We  then dissected from medial to lateral, staying right in the capsule of  the thyroid gland, taking down the middle thyroid vein and the inferior  thyroid artery between metal clips.  We identified the superior  parathyroid gland  on the right.  We identified what we thought was the  inferior parathyroid gland on the right.  We felt that we had preserved  both of these structures.  We identified the recurrent laryngeal nerve  coming in below the thyroid cartilage.  We continued our mobilization,  staying right in the thyroid capsule and mobilized all the way to the  midline.  We took down a small pyramidal lobe with the Harmonic scalpel.  We divided the isthmus with the Harmonic scalpel.  We marked the thyroid  lobe with a silk suture in the superior pole.   Dr. Dierdre Searles in pathology looked at the thyroid specimen.  She said there was  a 0.7-cm nodule and a 1.7-cm nodule, but she said that both of these  appeared to be benign follicular lesions and there was no evidence of  malignancy.  We felt that no further surgery needed to be done.  We  palpated the left lobe of the thyroid and felt no abnormality.  The  wound was irrigated  with saline.  A piece of Surgicel gauze was placed  in the thyroid bed and observed for about 5 minutes and there was no  bleeding.  We then closed the strap muscles in the midline with a  running suture of 3-0 Vicryl.  Platysma muscle was closed with  interrupted sutures of 3-0 Vicryl and the skin was closed with a running  subcuticular suture of 4-0 Monocryl and Steri-Strips.  Clean bandages  were placed and the patient taken to the recovery room in stable  condition.  Estimated blood loss was about 10-20 mL.  Complications,  none.  Sponge, needle and instrument counts were correct.      Angelia Mould. Derrell Lolling, M.D.  Electronically Signed     HMI/MEDQ  D:  08/19/2008  T:  08/19/2008  Job:  [id number]   cc:   Lajuana Matte, MD  Fax: [phone]   Ernestina Penna, M.D.  Fax: [phone]   Clinton D. Maple Hudson, MD, FCCP, FACP  [facility]  [address]  [place]  [place] [address]   Ines Bloomer, M.D.  [address]  [place]  [place] [address]

## 2010-12-21 NOTE — Letter (Signed)
October 04, 2007   Clinton D. Maple Hudson, MD, FCCP, FACP  [facility]  [address]  [place] [address]   Re:  Erica [name], Erica [name]                 DOB:  [birth year]   Dear Levonne Spiller:   I saw Erica [name] back in the office today.  She is doing much better.  Chest x-ray just showed some mild postoperative changes.  We did a right  upper lobectomy on her.  Her blood pressure is 104/54, pulse 64,  respirations 18, and saturations were 98%.  Head, eyes, ears, nose, and  throat are unremarkable.  Neck is supple without thyromegaly.  Her  incisions are well-healed.  I told her to gradually increase her  activities.  I will refer her to Dr. Arbutus Ped for evaluation since she is  stage IA.  I doubt if anything else will need to be done.   Ines Bloomer, M.D.  Electronically Signed   DPB/MEDQ  D:  10/04/2007  T:  10/05/2007  Job:  [id number]   cc:   Lajuana Matte, MD

## 2010-12-21 NOTE — Discharge Summary (Signed)
Erica [name], Erica [name]                 ACCOUNT NO.:  [id number]   MEDICAL RECORD NO.:  [id number]          PATIENT TYPE:  INP   LOCATION:  3315                         FACILITY:  [facility]   PHYSICIAN:  Ines Bloomer, M.D. DATE OF BIRTH:  Jun 29, 1921   DATE OF ADMISSION:  09/11/2007  DATE OF DISCHARGE:  09/17/2007                               DISCHARGE SUMMARY   HISTORY OF PRESENT ILLNESS:  The patient is an 77 year old female who  was found to have a right upper lobe lung lesion.  She has a known  history of COPD and asthma for which she was treated by Dr. Jetty Duhamel.  A needle biopsy was obtained and this revealed adenocarcinoma.  PET scan was positive only in this mass.  She was referred to Dr. Edwyna Shell  for surgical consultation and he recommended resection.  The patient  denied hemoptysis, fever, chills, or excessive sputum.  The right upper  lobe nodule was 18.5 mm and had a ground-glass appearance.  She was  admitted for the procedure.   MEDICATIONS:  Prior to admission:  1. Advair 250/50 twice daily.  2. Calcium dosage not listed.  3. Fish oil dosage not listed.  4. Iron dosage not listed.  5. Vitamin D dosage not listed.  6. Vitamin C dosage not listed.  7. Vitamin E dosage not listed.  8. Omeprazole 20 mg daily.  9. Lipitor 10 mg daily.  10.Singulair 10 mg daily.  11.Astelin dosage not listed.  12.Travatan eye drops for glaucoma, dosage not listed.  13.Folic acid dosage not listed.   Note:  The dosages were not listed in the history and physical.   ALLERGIES:  No known drug allergies.   PAST MEDICAL HISTORY:  Significant for:  1. Dyslipidemia.  2. Emphysema.   Family history, social history, review of symptoms, and physical exam  please see the history and physical done at the time of admission.   HOSPITAL COURSE:  The patient was admitted, electively taken to the  operating room on September 11, 2007.  At which time, she underwent a  right video-assisted  thoracoscopy, right thoracotomy, wedge resection of  the right upper lobe, right upper lobe lobectomy, and lymph node  dissection.  She tolerated the procedure well and was taken to the  Postanesthesia Care Unit in stable condition.   POSTOPERATIVE HOSPITAL COURSE:  The patient made a good and gradual  progress.  All routine lines, monitors, and drainage devices were  discontinued in the standard fashion.  She did have postoperative atrial  fibrillation.  She was treated with amiodarone and Lopressor.  Her  incision healed well without evidence of infection.  Her activity was  gradually increased per standard protocols.  She was seen on September 17, 2007, and deemed to be acceptable for discharge at that time.   INSTRUCTIONS:  The patient received written instructions in regard to  medications, activity, diet, wound care, and followup.  Followup  included Dr. Edwyna Shell 1 week post discharge.   MEDICATIONS ON DISCHARGE:  1. Lopressor 12.5 mg twice daily.  2. Amiodarone 200 mg  twice daily.  3. Ultram 50 mg q.6 h. as needed for pain.  4. Advair 250/50 one puff 2 times daily.  5. Calcium 600 mg 2 times daily.  6. Fish oil 2 times daily.  7. Vitamin D 50,000 units every week.  8. Omeprazole 20 mg daily q.p.m.  9. Lipitor 5 mg daily.  10.Vitamin C 500 mg daily.  11.Singulair 10 mg daily.  12.Travatan eye drops 0.004% one drop each eye at bedtime.  13.Pataday ophthalmic 0.2% one drop each eye daily.  14.Folic acid 400 mcg daily.  15.Ferrous sulfate 27 mg daily.   FINAL DIAGNOSIS:  Right upper lobe lung mass.  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  For full details of the pathology, please see the printed report  available.   OTHER DIAGNOSES:  1. History of glaucoma.  2. History of hyperlipidemia.  3. History emphysema/chronic obstructive pulmonary disease.  4. History of diaphragmatic hernia.  5. History of  arthritis.      Rowe Clack, P.A.-C.      Ines Bloomer, M.D.  Electronically Signed    WEG/MEDQ  D:  11/28/2007  T:  11/29/2007  Job:  [id number]

## 2010-12-21 NOTE — H&P (Signed)
[name], [name]                 ACCOUNT NO.:  [id number]   MEDICAL RECORD NO.:  [id number]          PATIENT TYPE:  INP   LOCATION:  NA                           FACILITY:  [facility]   PHYSICIAN:  Ines Bloomer, M.D. DATE OF BIRTH:  03-19-1921   DATE OF ADMISSION:  DATE OF DISCHARGE:                              HISTORY & PHYSICAL   CHIEF COMPLAINT:  Lung mass.   This 77 year old female was found to have a right upper lobe lesion. She  has been treated by Dr. Fannie Knee for chronic obstructive pulmonary  disease and asthma.  Needle biopsy revealed adenocarcinoma.  The PET  scan was positive only in this area.  Her pulmonary function tests  showed FVC of 2.5 with FEV1 of 1.74. She previously had a right inguinal  hernia repaired by Dr. Derrell Lolling in 2005.  She has had no hemoptysis,  fever, chills, excessive sputum.  The right upper lobe nodule was 18.5  mm and had a ground-glass appearance.  She is being admitted for wedge  resection of the right upper lobe lesion with seed implantation.   MEDICATIONS:  1. Advair 250/50 twice a day.  2. Calcium.  3. Fish oil.  4. Iron.  5. Vitamin D.  6. Vitamin C.  7. Vitamin E.  8. Omeprazole 20 mg daily for hiatal hernia.  9. Lipitor 10 mg daily for dyslipidemia.  10.Singulair 10 mg daily.  11.Astelin.  12.Travatan for glaucoma.  13.Folic acid.   ALLERGIES:  No known drug allergies.   PAST MEDICAL HISTORY:  Significant for:  1. Dyslipidemia.  2. Emphysema.   FAMILY HISTORY:  Noncontributory.   SOCIAL HISTORY:  She is widowed, has 2 children.  Does not drink or  smoke.   REVIEW OF SYSTEMS:  Her weight is stable. She is 137 pounds, 5 feet 1  inch.  CARDIAC:  Shortness of breath with exertion.  No angina or atrial  fibrillation.  PULMONARY:  History of asthma.  See History of Present  Illness.  GI:  Hiatal hernia and constipation.  GU:  No kidney disease,  dysuria, or frequent urination.  VASCULAR:  No claudication, DVT, or  TIAs,  but she does have varicose veins.  NEUROLOGIC:  No headaches,  blackouts, or seizures.  MUSCULOSKELETAL:  She has chronic arthritis.  PSYCHIATRIC:  No nervousness or depression.  HEAD, EYES, EARS, NOSE, AND  THROAT:  No change in her hearing.  She has had glaucoma since 2006.  HEMATOLOGIC:  She has had anemia but no bleeding or clotting disorders.   PHYSICAL EXAMINATION:  VITAL SIGNS:  Blood pressure 132/78, pulse 97,  respirations 18, O2 saturation 98%.  GENERAL:  She is a well-developed Caucasian female in no acute distress.  HEENT:  Head is atraumatic.  Eyes:  Pupils equal, round, and reactive to  light and accommodation.  Extraocular movements are normal.  Ears:  Tympanic membranes are intact.  Nose:  No septal deviation. Throat is  without lesion.  Uvula is midline.  NECK:  Supple without thyromegaly.  There is no jugular venous  distention.  No axillary  or supraclavicular adenopathy.  CHEST:  Clear to auscultation and percussion.  HEART:  Regular sinus rhythm, no murmurs.  ABDOMEN:  Soft, no hepatosplenomegaly.  EXTREMITIES:  Pulses 2+.  There is no clubbing or edema.  NEUROLOGIC:  Oriented x3.  Sensory and motor intact.  Cranial nerves  intact.  SKIN:  She has decreased turgor but no lesions.  She has varicosities of  both lower extremities.   IMPRESSION:  1. Right upper lobe lung cancer.  2. Chronic obstructive pulmonary disease.  3. Asthma.  4. Dyslipidemia.  5. Hiatal hernia.   PLAN:  Right VATS wedge resection of right upper lobe lesion with seed  implantation.      Ines Bloomer, M.D.  Electronically Signed     DPB/MEDQ  D:  09/09/2007  T:  09/09/2007  Job:  [id number]

## 2010-12-21 NOTE — Assessment & Plan Note (Signed)
OFFICE VISIT   Erica [name], Erica [name]  DOB:  [birth year]                                        January 31, 2008  CHART #:  [id number]   The patient came for followup.  She is now over 3 months since her  resection.  She will get shortness of breath with exertion.  Her blood  pressure was 114/69, pulse 75, respirations 18, and sats were 99%.  Lungs are clear to auscultation and percussion.  I suggested that she  see Dr. Jetty Duhamel and see if he can adjust her inhalers as far as  for her shortness of breath.  She will be getting a CT scan in August  and I will see her back again at that time.   Ines Bloomer, M.D.  Electronically Signed   DPB/MEDQ  D:  01/31/2008  T:  01/31/2008  Job:  [id number]

## 2010-12-21 NOTE — Letter (Signed)
October 04, 2007   Ernestina Penna, M.D.  [address] [place], [place] [address]   Re:  Erica [name], Erica [name]                 DOB:  1920-11-19   Dear Roe Coombs,   I saw Erica [name] back today.  She is now two weeks after her wedge right  upper lobectomy for a bronchioalveolar cancer.  She is doing well and no  other problems.  Her incision is well healed.  Her chest x-ray showed  normal postoperative changes.  I told her she can start driving next  week.  Her blood pressure is 104/54, pulse 64, respirations 18 and sat's  were 98%.   I will see her back again in four weeks with a chest x-ray and I will  refer her to Dr. Arbutus Ped for consultation, although she was a stage Ia  cancer and probably will only require observation.   Ines Bloomer, M.D.  Electronically Signed   DPB/MEDQ  D:  10/04/2007  T:  10/05/2007  Job:  [id number]   cc:   Artist Pais Kathrynn Running, M.D.  Lajuana Matte, MD

## 2010-12-21 NOTE — Letter (Signed)
September 20, 2007   Clinton D. Maple Hudson, MD, FCCP, FACP  [facility]  [address]  [place], [place] [address]   Re:  ZANA, BIANCARDI                 DOB:  [birth year]   Dear Levonne Spiller:   I saw the patient back in the office today after we did a VATS right  upper lobectomy on her.   Her blood pressure is 112/65, pulse 77 and regular.  Respirations were  18, and sats were 98%.   She still has shortness of breath with exertion.  We removed her chest  tube sutures.   Her chest x-ray showed her lungs expanding, but there is some  subcutaneous air.  Her incision are well healed.  She is back in normal  sinus rhythm.  She appears to be doing satisfactory.  We got her path  report back which shows bronchioalveolar cancer stage 1A.  No further  treatment is recommended.  I will see her back again in 2 weeks.  At  that time I will refer her to oncology for an oncology evaluation.   Ines Bloomer, M.D.  Electronically Signed   DPB/MEDQ  D:  09/20/2007  T:  09/21/2007  Job:  [id number]

## 2010-12-21 NOTE — Assessment & Plan Note (Signed)
OFFICE VISIT   Erica [name], Erica [name]  DOB:  Jul 03, 1921                                        November 01, 2007  CHART #:  [id number]   The patient came for followup today.   Her blood pressure is 113/64, pulse 85, respirations 20, sat 96%.  Her  incision is well healed.  Her chest x-ray showed normal postoperative  changes.  She is doing well overall.   We will see her back again in 3 months with a CT scan.   Ines Bloomer, M.D.  Electronically Signed   DPB/MEDQ  D:  11/01/2007  T:  11/01/2007  Job:  [id number]

## 2010-12-21 NOTE — Op Note (Signed)
Erica [name], Erica [name]                 ACCOUNT NO.:  [id number]   MEDICAL RECORD NO.:  [id number]          PATIENT TYPE:  INP   LOCATION:  2550                         FACILITY:  [facility]   PHYSICIAN:  Ines Bloomer, M.D. DATE OF BIRTH:  1920/10/28   DATE OF PROCEDURE:  09/11/2007  DATE OF DISCHARGE:                               OPERATIVE REPORT   PREOPERATIVE DIAGNOSIS:  Adenocarcinoma of right upper lobe.   POSTOPERATIVE DIAGNOSIS:  Adenocarcinoma of right upper lobe.   OPERATION PERFORMED:  1. Right video-assisted thoracic surgery.  2. Right upper lobectomy.  3. Node dissection.  4. Minithoracotomy.   SURGEON:  Ines Bloomer, M.D.   FIRST ASSISTANT:  Stephanie Acre Dominick, PA-C   ANESTHESIA:  General.   DESCRIPTION OF PROCEDURE:  A percutaneous insertion, all monitored  lines, with the patient under general anesthesia was turned to the right  lateral thoracotomy position, and was prepped and draped in the usual  sterile manner.  Two trocar sites were made in the anterior and  posterior axis line and carried down with electrocautery through the  subcutaneous tissue and fascia.  Two trocars were inserted. The right  upper lobe was the same.   This patient had a previous right upper lobe ground glass appearance in  the posterior segment and had a needle biopsy done which revealed  adenocarcinoma.  A 5-6 cm incision was made over the triangle with  auscultation and the latissimus was partially divided and minimal rib  spreading was done.   We then palpated the lesion in the posterior segment and using an auto  suture #60 stapler resected it with a wide margin.  We sent if for  frozen section, and they could only see atypical cells in there.  Because of this, and the rest of the right upper lobe felt the same, it  was best to just do a lobectomy to be sure that we had complete and  adequate margins. So, dissection was started in the hilum dissecting out  the 4-R and the 10-R  nodes, and then the apical and posterior branch was  then dissected and then divided with the auto suture a 32 mm stapler.   The severe pulmonary vein graft was then divided with the auto suture 30  mm stapler which exposed the posterior branch which was stapled and  divided with the auto suture 2 mm stapler.  The bronchus was then  divided with the auto suture, green, 45 stapler.  Several 10-R nodes  were dissected free with the specimen.  We then went posteriorly and  divided the inferior pulmonary ligament and dissected out some 9-R nodes  and some 7 nodes.  The fissure was then divided with the autosuture 60-  stapler.  The right upper lobe was removed.   All bleeding was electrocoagulated.  Two chest tubes were brought into  the trocar sites and tied in place with 3-0 silk.  A Marcaine block was  done in the usual fashion.  CoSeal was applied to the staple line.  The  chest was closed with 3 pericostals, #1  in the muscle layer, 2-0 Vicryl  in the subcutaneous tissue and Dermabond for the skin.      Ines Bloomer, M.D.  Electronically Signed     DPB/MEDQ  D:  09/11/2007  T:  09/11/2007  Job:  [id number]

## 2010-12-21 NOTE — Assessment & Plan Note (Signed)
OFFICE VISIT   Erica [name], Erica [name]  DOB:  1920-11-18                                        October 07, 2008  CHART #:  [id number]   The patient came today.  She had a thyroid lobectomy by Dr. Derrell Lolling for  an adenomatous nodule.  Her blood pressure was 100/60, pulse 100,  respiration 24, and sats were 97%.  CT scan showed no evidence of  recurrence of her lesion and a 8.1 x 4.9 mm lesion in the right breast  was also unchanged.  We will see her back again in 6 months with another  CT scan.   Ines Bloomer, M.D.  Electronically Signed   DPB/MEDQ  D:  10/07/2008  T:  10/07/2008  Job:  [id number]

## 2010-12-21 NOTE — Letter (Signed)
April 14, 2009   Lajuana Matte, MD  [phone]  [place], [place] [address]   Re:  BAHAR, SHELDEN                 DOB:  [birth year]   Dear Arbutus Ped,   I saw the patient for followup.  She is now 18 months since we did a  right upper lobectomy for her.  There is no evidence of recurrence of  cancer.  She has another CT scheduled in 6 months.  I think she is doing  well overall, and I will let you follow her from now on.  I will be  happy to see her again if she develops any future problems.   Sincerely,   Ines Bloomer, M.D.  Electronically Signed   DPB/MEDQ  D:  04/14/2009  T:  04/15/2009  Job:  [id number]

## 2010-12-21 NOTE — Assessment & Plan Note (Signed)
[facility]                             PULMONARY OFFICE NOTE   NAME:Erica [name], Erica [name]                        MRN:          [id number]  DATE:07/13/2007                            DOB:          03-11-1921    PROBLEMS:  1. Dyspnea with ground glass change right upper lobe and minor nodule      on CT.  2. Hiatal hernia.  3. Seasonal allergic rhinitis.   HISTORY:  Had repeat CT scan 07/11/2007 at [facility] with a concern that  right upper lobe ground glass nodule might be a low grade  adenocarcinoma/BAC, and also noting large hiatal hernia. Smaller lymph  nodes have been stable. Heart size at upper limit of normal. Coronary  calcifications incidentally noted. She occasionally feels sore in the  left upper quadrant. Dyspnea with long walks has not changed since this  summer. There have been no sudden events. She has not recognized obvious  reflux and she denies exertional chest pain or palpitation.   MEDICATIONS:  1. Advair 250/50.  2. Calcium.  3. Fish oil.  4. Iron.  5. Multivitamins.  6. Omeprazole 20 mg.  7. Lipitor 1/2 x10 mg.  8. Singulair 10 mg.  9. Pataday.  10.Travatan eye drops.  11.Astelin nasal spray p.r.n.   ALLERGIES:  No medication allergy.   OBJECTIVE:  Weight 132 pounds, blood pressure 128/82, pulse 68, room air  saturation 100%. I do not find lymph nodes or edema. She is a little  tender in the left upper quadrant without spleen tip or mass felt, I am  not sure if she just is not tender on the rib cage. Her chest is quiet  and clear with no focal findings. Heart sounds are regular without  murmur.   IMPRESSION:  Primary concern for me is right upper lobe ground glass  mass.   PLAN:  We are arranging needle biopsy by interventional radiology and  scheduling return here in one month, earlier p.r.n.     Clinton D. Maple Hudson, MD, Tonny Bollman, FACP  Electronically Signed    CDY/MedQ  DD: 07/14/2007  DT: 07/15/2007  Job #: [id number]   cc:   Ernestina Penna, M.D.

## 2010-12-21 NOTE — Assessment & Plan Note (Signed)
[facility]                             PULMONARY OFFICE NOTE   NAME:Erica [name], Erica [name]                        MRN:          [id number]  DATE:03/26/2007                            DOB:          1920-08-22    PROBLEM:  Pulmonary consultation at the kind request of Dr. Vernon Prey  for this 77 year old woman with abnormal x-ray.   HISTORY:  She is a never smoker with significant second hand exposure  and a history of asthma and allergy with cough which wakes her.  Exertional dyspnea has been stable over the last few years.  She has had  only a sample of an albuterol inhaler.  Cough has been dry and has  actually faded recently.  She had a chest x-ray in June which showed  cardiomegaly and COPD with some atelectasis or scarring at the left base  and questionable nodularity at the left base.  She then had a CT scan at  [facility] on February 05, 2007, showing some faint ground glass  opacity in the right upper lobe, moderate to large hiatal hernia, an  indeterminate 1-cm right hepatic lesion, and comment that the chest x-  ray opacity from the June 6th study was consistent with mild scarring or  atelectasis.  There was a 4-mm nodule in the right lower lobe   MEDICATIONS:  1. Advair 250/50.  2. Calcium.  3. Fish oil.  4. Iron.  5. Vitamin.  6. Omeprazole 20 mg.  7. Lipitor 10 mg.  8. Singulair 10 mg.  9. Pataday and Travatan eye drops.  10.Astelin p.r.n.   No medication allergy.   REVIEW OF SYSTEMS:  Some exertional and resting dyspnea, chest pains,  tooth problems, no fever, weight loss, bloody or purulent discharge,  adenopathy, palpitations, or edema.   PAST HISTORY:  1. Asthma.  2. Allergic rhinitis.  3. She denied a history of heart disease, pneumonia, or ENT surgery.  4. Hiatal hernia.  5. Osteoporosis.  6. Glaucoma.  7. Seasonal rhinitis, more in the fall than spring.  8. No exposure to tuberculosis.  9. No prior cancer.  10.Hernia  repair.  11.Hemorrhoidectomy.  12.Bilateral cataracts.  13.Removal of a toenail.  14.Panendoscopy by Dr. Yancey Flemings in 2003, for evaluation of anemia      finding a distal esophageal stricture attributed to reflux with      mild esophagitis at that time.  15.Colon diverticulosis.  16.Echocardiogram in 2006, essentially normal with normal left      ventricular ejection fraction.   No intolerance to latex, contrast dye, or aspirin.   SOCIAL HISTORY:  Never smoked herself, but husband smoked until 20 years  ago.  She is now widowed, living alone.  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   FAMILY HISTORY:  Heart disease.   OBJECTIVE:  VITAL SIGNS:  Weight 131 pounds, BP 118/58, pulse 84, room  air saturation 97%.  HEENT:  Missing teeth.  Pharynx slightly reddened.  No stridor.  Nasal  airway clear.  GENERAL:  Alert.  No distress.  NECK:  No neck vein distention.  CHEST:  Clear without wheeze, rales, or rhonchi.  HEART:  Sounds regular without murmur or gallop.  ABDOMEN:  Without enlargement of liver or spleen.  EXTREMITIES:  Without cyanosis, clubbing, or edema.   IMPRESSION:  1. Dyspnea with abnormal CT with ground glass change in the right      upper lobe, minor nodule.  2. Hiatal hernia with risk for aspiration.  3. Seasonal allergic rhinitis.   The right upper lobe ground glass process is very nonspecific and would  certainly be consistent with a minor aspiration pneumonitis or some  resolving inflammatory process.  The most secure way to ensure a stable  benign situation would be to repeat the CT.  I have suggested that she  follow up with Dr. Christell Constant in December which would be about the right  timing for a 69-month followup after her June CT scan.  He could repeat  her CT and refer her back to me if there is work to be done following  that.  She was very comfortable with this approach.   PLAN:  1. She is carrying her x-rays back to Dr. Kathi Der  office.  2. Reflux precautions were emphasized.  3. Claritin for p.r.n. trial.  4. She will see Dr. Christell Constant in followup and have him arrange a followup      chest CT for comparison with her February 05, 2007 study, looking      specifically at the ground glass change in the right upper lobe and      the 4-mm nodule in the right lower lobe.   I would be very happy to see her again if I could be helpful.     Clinton D. Maple Hudson, MD, Tonny Bollman, FACP  Electronically Signed    CDY/MedQ  DD: 03/28/2007  DT: 03/29/2007  Job #: [id number]   cc:   Ernestina Penna, M.D.

## 2010-12-24 NOTE — Op Note (Signed)
Erica [name], Erica [name]                           ACCOUNT NO.:  [id number]   MEDICAL RECORD NO.:  [id number]                   PATIENT TYPE:  AMB   LOCATION:  DAY                                  FACILITY:  [facility]   PHYSICIAN:  Angelia Mould. Derrell Lolling, M.D.             DATE OF BIRTH:  03-07-1921   DATE OF PROCEDURE:  08/20/2003  DATE OF DISCHARGE:                                 OPERATIVE REPORT   PREOPERATIVE DIAGNOSIS:  Right inguinal hernia.   POSTOPERATIVE DIAGNOSIS:  Right inguinal hernia.   PROCEDURE:  Repair right inguinal hernia with mesh Armanda Heritage repair).   SURGEON:  Angelia Mould. Derrell Lolling, M.D.   INDICATIONS FOR PROCEDURE:  This is an 77 year old white female in stable  medical health.  She has had a bulge and some burning discomfort in her  right groin for about six weeks.  She is troubled by this and asks that  something be done. On examination, she has a moderate size right inguinal  hernia which is reducible, no other hernias elsewhere.  She is brought to  the operating room elective.   TECHNIQUE:  The patient was brought to the operating room and placed supine  on the operating table. She was monitored and sedated by the anesthesia  department. The right groin was prepped and draped in a sterile fashion.  One percent Xylocaine with epinephrine was used as a local infiltrating  anesthetic and a right ilioinguinal nerve block. A transverse incision was  made overlying the inguinal canal.  Dissection was carried down through the  subcutaneous tissue.  We exposed the aponeurosis of the external oblique and  the external inguinal ring. The external oblique was incised in the  direction of its fibers, opening up the external inguinal ring.  The  ilioinguinal nerve was isolated, clamped at the level of the internal ring,  divided and ligated with 2-0 silk tie. The round ligament was divided at the  pubic tubercle and we found that she had a large indirect hernia sac. We  dissected  this away from some of the fatty tissue which was debrided.  We  then opened the sac and inspected the contents of the sac.  The sac was  empty and looked like a simple sac. We did not find any evidence of a  femoral hernia. The indirect hernia sac was then twisted and suture ligated  at the level of the internal ring with the suture ligature of 2-0 silk. The  redundant sac was excised and discarded.  The floor of the inguinal canal  was repaired and reinforced with an onlay graft of polypropylene mesh. A 3  inch x 6 inch piece of polypropylene was cut down size a little bit and  sutured in place with running sutures of 2-0 Prolene.  The mesh was sutured  so as to generously overlap the fascia at the pubic tubercle, along the  inguinal ligament  inferiorly, and then along the internal oblique and  conjoined tendon superiorly.  Suture lines were completed laterally. This  provided a very secure coverage and repair throughout the floor of the  inguinal canal.  Hemostasis was excellent and achieved with electrocautery.  The wound was irrigated with saline.  The external oblique was closed with  running suture of 2-0 Vicryl.  Scarpa's fascia was closed with 3-0 Vicryl  sutures and the skin closed with a running subcuticular suture of 4-0 Vicryl  and Steri-Strips.  Clean bandages were placed and the patient taken to the  recovery room in stable condition.  Estimated blood loss was about 10 mL.  Complications none. Sponge, needle and instrument counts were correct.                                               Angelia Mould. Derrell Lolling, M.D.    HMI/MEDQ  D:  08/20/2003  T:  08/20/2003  Job:  [id number]   cc:   Ernestina Penna, M.D.  [address] [place]  [place] [address]  Fax: [phone]

## 2011-01-19 ENCOUNTER — Ambulatory Visit: Payer: MEDICARE | Admitting: Cardiology

## 2011-01-24 ENCOUNTER — Ambulatory Visit (INDEPENDENT_AMBULATORY_CARE_PROVIDER_SITE_OTHER): Payer: Medicare Other | Admitting: Internal Medicine

## 2011-01-24 ENCOUNTER — Encounter: Payer: Self-pay | Admitting: Internal Medicine

## 2011-01-24 VITALS — BP 112/60 | HR 93 | Ht 59.0 in | Wt 137.0 lb

## 2011-01-24 DIAGNOSIS — R0600 Dyspnea, unspecified: Secondary | ICD-10-CM

## 2011-01-24 DIAGNOSIS — J449 Chronic obstructive pulmonary disease, unspecified: Secondary | ICD-10-CM | POA: Insufficient documentation

## 2011-01-24 DIAGNOSIS — I4891 Unspecified atrial fibrillation: Secondary | ICD-10-CM

## 2011-01-24 DIAGNOSIS — J4489 Other specified chronic obstructive pulmonary disease: Secondary | ICD-10-CM | POA: Insufficient documentation

## 2011-01-24 DIAGNOSIS — R0609 Other forms of dyspnea: Secondary | ICD-10-CM

## 2011-01-24 DIAGNOSIS — R0989 Other specified symptoms and signs involving the circulatory and respiratory systems: Secondary | ICD-10-CM

## 2011-01-24 LAB — PULMONARY FUNCTION TEST

## 2011-01-24 MED ORDER — ALBUTEROL SULFATE HFA 108 (90 BASE) MCG/ACT IN AERS: 2.0000 | INHALATION_SPRAY | Freq: Four times a day (QID) | RESPIRATORY_TRACT | Status: DC | PRN

## 2011-01-24 NOTE — Progress Notes (Signed)
Subjective:    Patient ID: Erica [name], female    DOB: October 30, 1920, 77 y.o.   MRN: [id number]  HPI  Subjective:    Patient ID: Erica [name], female    DOB: March 12, 1921, 77 y.o.   MRN: [id number]  HPI 12/07/10- 56 yoF seen on referral by Dr Antoine Poche. Here with daughter. I last saw her in 2008 when needle bx of a RUL nodule -> nonsmall cell cancer. This was resected by Dr Edwyna Shell with RUL lobectomy and subsequently followed by Dr Shirline Frees without XRT or Chem. No recurrence so far.  She never smoked, but her husband did. Old record reviewed, without PFT found. Recently she has been treated by Dr Antoine Poche for AFib. The question is how much of her dyspnea is pulmonary and whether her lung disease contributes to her arrythmia. She describes some chronic exertional dyspnea, worse since around the time she began coumadin. Noticed walking briskly and when lying down, relieved by rest. SOB to mailbox and back- stopping to get her breath. Breathing meds also help some. Occasional dry cough, some wheeze, and a sensation of something moving in left lower chest or upper abdomen. Known large hiatal hernia but she denies reflux. She is on warfarin, but denies anemia. CT chest 10/04/10 reviewed- S/p RUL lobectomy and Rt thyroidectomy. No mets or nodes. NAD, Nl heart size.  01/24/11- ?COPD, Hx RULobect for NSCCA .  Daughter here Comes for f/u/ Mentions pain under left scapula x months, worse with prolonged standing, not progressive, controlled with tylenol. We referred to the CT she had 4 months ago. She denies cough or wheeze, admitting her DOE may be age- appropriate.  PFT- moderate reversible slowing in small airways , DLCO 69% FEV1/FVC 0.58; DLCO 69% . Good oxygenation 99%, 98%, 99% 268 meters  Review of Systems See HPI Constitutional:   No weight loss, night sweats,  Fevers, chills, fatigue, lassitude. HEENT:   No headaches,  Difficulty swallowing,  Tooth/dental problems,  Sore throat,                No  sneezing, itching, ear ache, nasal congestion, post nasal drip,   CV:  No chest pain,  Orthopnea, PND, swelling in lower extremities, anasarca, dizziness, palpitations  GI  No heartburn, indigestion, abdominal pain, nausea, vomiting, diarrhea, change in bowel habits, loss of appetite  Resp: No acute  shortness of breath with exertion or at rest.  No excess mucus, no productive cough,  No non-productive cough,  No coughing up of blood.  No change in color of mucus.  No wheezing.    Skin: no rash or lesions.  GU: no dysuria, change in color of urine, no urgency or frequency.  No flank pain.  MS:  No joint pain or swelling.  No decreased range of motion.  No back pain.  Psych:  No change in mood or affect. No depression or anxiety.  No memory loss.      Objective:   Physical Exam General- Alert, Oriented, Affect-appropriate, Distress- none acute    Elderly Skin- rash-none, lesions- none, excoriation- none  Lymphadenopathy- none  Head- atraumatic  Eyes- Gross vision intact, PERRLA, conjunctivae clear, secretions  Ears-  / Decreased/ Normal for age- Hearing, canals, Tm  Have to speak loudly  Nose- Clear, No-Septal dev, mucus, polyps, erosion, perforation   Throat- Mallampati II , mucosa clear , drainage- none, tonsils- atrophic   Many worn and missing treeth  Neck- flexible , trachea midline, no stridor , thyroid scar,  No carotid no bruit  Chest - symmetrical excursion , unlabored     Heart/CV- RRR , no murmur , no gallop  , no rub, nl s1 s2                     - JVD- none , edema- none, stasis changes- none, varices- none     Lung- clear to P&A,trace end-expiratory wheeze, cough- none , dullness-none, rub- none     Chest wall-  Back is not tender in left infrascapular area indicated- no rub heard  Abd- tender-no, distended-no, bowel sounds-present, HSM- no  Br/ Gen/ Rectal- Not done, not indicated  Extrem- cyanosis- none, clubbing, none, atrophy- none, strength-  nl  Neuro- grossly intact to observation         Assessment & Plan:     Review of Systems     Objective:   Physical Exam        Assessment & Plan:

## 2011-01-24 NOTE — Patient Instructions (Signed)
Add rescue inhaler Proair- script written- use if needed

## 2011-01-24 NOTE — Progress Notes (Signed)
PFT done today. 

## 2011-01-24 NOTE — Assessment & Plan Note (Addendum)
Mild to moderate reversible obstruction, best characterized as asthma. We will try to help with a bronchodilator. Adding asthma as a diagnosis, although it won't explain reduced DLCO which has cardiac and lobectomy components.

## 2011-01-24 NOTE — Assessment & Plan Note (Signed)
Pulse feels almost regular on exam today with frequent extra beats.

## 2011-01-27 ENCOUNTER — Encounter: Payer: Self-pay | Admitting: Internal Medicine

## 2011-02-02 ENCOUNTER — Ambulatory Visit (INDEPENDENT_AMBULATORY_CARE_PROVIDER_SITE_OTHER): Payer: Medicare Other | Admitting: Cardiology

## 2011-02-02 ENCOUNTER — Encounter: Payer: Self-pay | Admitting: Cardiology

## 2011-02-02 DIAGNOSIS — J449 Chronic obstructive pulmonary disease, unspecified: Secondary | ICD-10-CM

## 2011-02-02 DIAGNOSIS — R0600 Dyspnea, unspecified: Secondary | ICD-10-CM

## 2011-02-02 DIAGNOSIS — R0609 Other forms of dyspnea: Secondary | ICD-10-CM

## 2011-02-02 DIAGNOSIS — I4891 Unspecified atrial fibrillation: Secondary | ICD-10-CM

## 2011-02-02 MED ORDER — METOPROLOL TARTRATE 50 MG PO TABS: ORAL_TABLET | ORAL | Status: DC

## 2011-02-02 NOTE — Patient Instructions (Signed)
Please increase Toprol (metoprolol succinate) to 50 mg one and 1/2 tablets once a day Continue other medications as listed Follow up with Dr Antoine Poche in 6 weeks.

## 2011-02-02 NOTE — Assessment & Plan Note (Signed)
Given the fact that she continues to have a tachyarrhythmia despite not being found to have severe hypoxemia or lung disease perhaps my assumption that hypoxemia was driving the heart rate is not correct. Therefore, I will try further to control the heart rate. I not yet convinced that rhythm management is indicated.  I will increase her Toprol to 75 mg daily.

## 2011-02-02 NOTE — Progress Notes (Signed)
HPI The patient presents for followup of her atrial fibrillation with rapid rate. At the last appointment when I walked her around the office her oxygen saturation fell to 87%. This was driving her heart rate. However, I sent her to Dr. Maple Hudson and she had a 6 minute walk to his in which her oxygen level was good.  I reviewed his note and resolves. He is treating her for some component of asthma. At this point she still gets significantly tachycardic with activity. She describes heart rates going easily into the 100s with walking a short distance on level ground. She is not describing chest pressure, neck or arm discomfort. She is not describing PND or orthopnea. She has had no weight gain or edema.  No Known Allergies  Current Outpatient Prescriptions  Medication Sig Dispense Refill  . acetaminophen (TYLENOL) 500 MG tablet Take 500 mg by mouth every 6 (six) hours as needed.        [name] albuterol (PROAIR HFA) 108 (90 BASE) MCG/ACT inhaler Inhale 2 puffs into the lungs every 6 (six) hours as needed for wheezing or shortness of breath.  1 Inhaler  prn  . atorvastatin (LIPITOR) 10 MG tablet Take 5 mg by mouth daily.        . budesonide-formoterol (SYMBICORT) 160-4.5 MCG/ACT inhaler Inhale 2 puffs into the lungs 2 (two) times daily.        . Calcium Carbonate-Vitamin D (CALTRATE 600+D) 600-400 MG-UNIT per tablet Take 1 tablet by mouth 2 (two) times daily.        . Cholecalciferol (VITAMIN D) 1000 UNITS capsule Take 1,000 Units by mouth 2 (two) times daily.        [name] dextromethorphan-guaiFENesin (MUCINEX DM) 30-600 MG per 12 hr tablet Take 1 tablet by mouth every 12 (twelve) hours.        . Ferrous Gluconate (IRON) 240 (27 FE) MG TABS Take 1 tablet by mouth daily.        . fish oil-omega-3 fatty acids 1000 MG capsule Take 2 g by mouth daily.        . folic acid (FOLVITE) 400 MCG tablet Take 400 mcg by mouth daily.        [name] levothyroxine (SYNTHROID, LEVOTHROID) 50 MCG tablet Take 50 mcg by mouth daily.          [name] loratadine (CLARITIN) 10 MG tablet Take 10 mg by mouth daily.        . metoprolol succinate (TOPROL-XL) 25 MG 24 hr tablet Take 25 mg by mouth daily.        . montelukast (SINGULAIR) 10 MG tablet Take 10 mg by mouth at bedtime.        [name] omeprazole (PRILOSEC) 20 MG capsule Take 20 mg by mouth daily.        [name] Glycol-Propyl Glycol (SYSTANE) 0.4-0.3 % SOLN Place 1 drop into both eyes 2 (two) times daily.        [name] tiotropium (SPIRIVA) 18 MCG inhalation capsule Place 18 mcg into inhaler and inhale daily.        [name] warfarin (COUMADIN) 2.5 MG tablet as directed.          Past Medical History  Diagnosis Date  . Lung cancer 09-11-2007    Dr Edwyna Shell did surgery; [facility]  . Allergic rhinitis   . Atrial fibrillation   . Hyperlipidemia   . Asthma     Past Surgical History  Procedure Date  . Inguinal hernia repair  right  . Thyroid surgery     nodule resection  . Lung lobectomy 09-11-2007    right upper lobe    ROS: Back pain.  Otherwise as stated in the HPI and negative for all other systems..  PHYSICAL EXAM BP 120/78  Pulse 101  Resp 16  Ht 4\' 10"  (1.473 m)  Wt 135 lb (61.236 kg)  BMI 28.22 kg/m2 GENERAL:  Well appearing HEENT:  Pupils equal round and reactive, fundi not visualized, oral mucosa unremarkable, poor dentition NECK:  No jugular venous distention, waveform within normal limits, carotid upstroke brisk and symmetric, no bruits, no thyromegaly LYMPHATICS:  No cervical, inguinal adenopathy LUNGS:  Clear to auscultation bilaterally BACK:  No CVA tenderness CHEST:  Unremarkable HEART:  PMI not displaced or sustained,S1 and S2 within normal limits, no S3, no S4, no clicks, no rubs, no murmurs, irregular ABD:  Flat, positive bowel sounds normal in frequency in pitch, no bruits, no rebound, no guarding, no midline pulsatile mass, no hepatomegaly, no splenomegaly EXT:  2 plus pulses throughout, no edema, no cyanosis no clubbing SKIN:  No rashes no  nodules NEURO:  Cranial nerves II through XII grossly intact, motor grossly intact throughout PSYCH:  Cognitively intact, oriented to person place and time  ASSESSMENT AND PLAN

## 2011-02-02 NOTE — Assessment & Plan Note (Signed)
She will continue her medications as listed and continue to follow with Dr. Maple Hudson.

## 2011-03-10 ENCOUNTER — Encounter: Payer: Self-pay | Admitting: Cardiology

## 2011-03-16 NOTE — Progress Notes (Signed)
  Subjective:    Patient ID: Erica [name], female    DOB: 1921/03/28, 77 y.o.   MRN: [id number]  HPI Documenting 6 minute walk test   Review of Systems     Objective:   Physical Exam        Assessment & Plan:

## 2011-03-23 ENCOUNTER — Encounter: Payer: Self-pay | Admitting: Cardiology

## 2011-03-23 ENCOUNTER — Ambulatory Visit (INDEPENDENT_AMBULATORY_CARE_PROVIDER_SITE_OTHER): Payer: Medicare Other | Admitting: Cardiology

## 2011-03-23 DIAGNOSIS — R0600 Dyspnea, unspecified: Secondary | ICD-10-CM

## 2011-03-23 DIAGNOSIS — I4891 Unspecified atrial fibrillation: Secondary | ICD-10-CM

## 2011-03-23 MED ORDER — METOPROLOL SUCCINATE ER 100 MG PO TB24: 100.0000 mg | ORAL_TABLET | Freq: Every day | ORAL | Status: DC

## 2011-03-23 NOTE — Assessment & Plan Note (Signed)
Today I will increase her Metoprolol to 100 mg at 5 pm daily.  Further titrations will be based on her response to this.

## 2011-03-23 NOTE — Progress Notes (Signed)
HPI The patient presents for followup of her atrial fibrillation with rapid rate.  At the last appt I increased her beta blocker to see if a lower heart rate reduced her sense of dyspnea.  She thinks she is having fewer palpitations with this. She still getting dyspneic with activities. Treatment with Proair seemed to help as well. She's not having any presyncope or syncope. She denies having chest pressure, neck or arm discomfort. He is not having weight gain or edema. She's had no PND or orthopnea.  No Known Allergies  Current Outpatient Prescriptions  Medication Sig Dispense Refill  . acetaminophen (TYLENOL) 500 MG tablet Take 500 mg by mouth every 6 (six) hours as needed.        [name] albuterol (PROAIR HFA) 108 (90 BASE) MCG/ACT inhaler Inhale 2 puffs into the lungs every 6 (six) hours as needed for wheezing or shortness of breath.  1 Inhaler  prn  . atorvastatin (LIPITOR) 10 MG tablet Take 5 mg by mouth daily.        . budesonide-formoterol (SYMBICORT) 160-4.5 MCG/ACT inhaler Inhale 2 puffs into the lungs 2 (two) times daily.        . Calcium Carbonate-Vitamin D (CALTRATE 600+D) 600-400 MG-UNIT per tablet Take 1 tablet by mouth 2 (two) times daily.        . Cholecalciferol (VITAMIN D) 1000 UNITS capsule Take 1,000 Units by mouth 2 (two) times daily.        [name] dextromethorphan-guaiFENesin (MUCINEX DM) 30-600 MG per 12 hr tablet Take 1 tablet by mouth every 12 (twelve) hours.        . Ferrous Gluconate (IRON) 240 (27 FE) MG TABS Take 1 tablet by mouth daily.        . fish oil-omega-3 fatty acids 1000 MG capsule Take 2 g by mouth daily.        . folic acid (FOLVITE) 400 MCG tablet Take 400 mcg by mouth daily.        [name] levothyroxine (SYNTHROID, LEVOTHROID) 50 MCG tablet Take 50 mcg by mouth daily.        [name] loratadine (CLARITIN) 10 MG tablet Take 10 mg by mouth daily.        . metoprolol (LOPRESSOR) 50 MG tablet Take 1 and 1/2 tablets once a day  45 tablet  11  . omeprazole (PRILOSEC) 20 MG capsule  Take 20 mg by mouth daily.        [name] Glycol-Propyl Glycol (SYSTANE) 0.4-0.3 % SOLN Place 1 drop into both eyes 2 (two) times daily.        [name] tiotropium (SPIRIVA) 18 MCG inhalation capsule Place 18 mcg into inhaler and inhale daily.        [name] warfarin (COUMADIN) 2.5 MG tablet as directed.        . montelukast (SINGULAIR) 10 MG tablet Take 10 mg by mouth at bedtime.          Past Medical History  Diagnosis Date  . Lung cancer 09-11-2007    Dr Edwyna Shell did surgery; Dr[facility]  . Allergic rhinitis   . Atrial fibrillation   . Hyperlipidemia   . Asthma     Past Surgical History  Procedure Date  . Inguinal hernia repair     right  . Thyroid surgery     nodule resection  . Lung lobectomy 09-11-2007    right upper lobe    ROS: Back pain.  Otherwise as stated in the HPI and negative for  all other systems..  PHYSICAL EXAM BP 108/68  Pulse 87  Resp 18  Ht 5' (1.524 m)  Wt 133 lb (60.328 kg)  BMI 25.97 kg/m2 GENERAL:  Well appearing HEENT:  Pupils equal round and reactive, fundi not visualized, oral mucosa unremarkable, poor dentition NECK:  No jugular venous distention, waveform within normal limits, carotid upstroke brisk and symmetric, no bruits, no thyromegaly LYMPHATICS:  No cervical, inguinal adenopathy LUNGS:  Clear to auscultation bilaterally BACK:  No CVA tenderness CHEST:  Unremarkable HEART:  PMI not displaced or sustained,S1 and S2 within normal limits, no S3, no S4, no clicks, no rubs, no murmurs, irregular ABD:  Flat, positive bowel sounds normal in frequency in pitch, no bruits, no rebound, no guarding, no midline pulsatile mass, no hepatomegaly, no splenomegaly EXT:  2 plus pulses throughout, no edema, no cyanosis no clubbing SKIN:  No rashes no nodules NEURO:  Cranial nerves II through XII grossly intact, motor grossly intact throughout PSYCH:  Cognitively intact, oriented to person place and time  EKG:  Atrial fibrillation rate 87, no acute ST  T wave changes  ASSESSMENT AND PLAN

## 2011-03-23 NOTE — Patient Instructions (Signed)
Please increase Metoprolol to 100 mg a day, continue all other medications as listed  Follow up with Dr Antoine Poche in 2 months.

## 2011-03-23 NOTE — Assessment & Plan Note (Signed)
She has a follow up appt with Dr. Maple Hudson.  I think that her dyspnea might be multifactorial. No further cardiac work up is planned.

## 2011-04-28 LAB — CBC
HCT: 42.8
Hemoglobin: 14.5
MCHC: 34
MCV: 90.8
RDW: 13.2

## 2011-04-28 LAB — URINALYSIS, ROUTINE W REFLEX MICROSCOPIC
Ketones, ur: 15 — AB
Protein, ur: NEGATIVE
Urobilinogen, UA: 0.2

## 2011-04-28 LAB — APTT: aPTT: 25

## 2011-04-28 LAB — PROTIME-INR
INR: 0.9
Prothrombin Time: 12.6

## 2011-04-28 LAB — BLOOD GAS, ARTERIAL
Acid-base deficit: 0.9
Bicarbonate: 22.1
FIO2: 0.21
O2 Saturation: 98
Patient temperature: 98.6

## 2011-04-28 LAB — COMPREHENSIVE METABOLIC PANEL
BUN: 9
CO2: 19
Calcium: 9.6
Creatinine, Ser: 0.69
GFR calc non Af Amer: >60
Glucose, Bld: 104 — ABNORMAL HIGH
Sodium: 134 — ABNORMAL LOW
Total Protein: 6.7

## 2011-04-28 LAB — TYPE AND SCREEN: ABO/RH(D): A POS

## 2011-04-29 LAB — CBC
HCT: 32.4 — ABNORMAL LOW
Hemoglobin: 11.8 — ABNORMAL LOW
MCHC: 34.2
MCHC: 34.2
MCHC: 34.4
MCV: 90.2
MCV: 90.8
MCV: 91.1
Platelets: 171
Platelets: 177
RBC: 3.82 — ABNORMAL LOW
RDW: 12.9
RDW: 13.1
WBC: 8.8

## 2011-04-29 LAB — BASIC METABOLIC PANEL
BUN: 11
BUN: 7
CO2: 23
CO2: 28
Calcium: 8.1 — ABNORMAL LOW
Calcium: 8.7
Chloride: 102
Chloride: 103
Chloride: 106
Creatinine, Ser: 0.61
Creatinine, Ser: 0.68
GFR calc Af Amer: >60
Glucose, Bld: 162 — ABNORMAL HIGH
Glucose, Bld: 98
Potassium: 3.6
Sodium: 130 — ABNORMAL LOW

## 2011-04-29 LAB — COMPREHENSIVE METABOLIC PANEL
ALT: 12
AST: 21
Albumin: 2.9 — ABNORMAL LOW
Calcium: 8.3 — ABNORMAL LOW
Creatinine, Ser: 0.53
GFR calc Af Amer: >60
GFR calc non Af Amer: >60
Sodium: 132 — ABNORMAL LOW
Total Protein: 5.7 — ABNORMAL LOW

## 2011-05-13 LAB — PROTIME-INR: Prothrombin Time: 12.6

## 2011-05-13 LAB — CBC
MCHC: 35
MCV: 89.1
Platelets: 232
WBC: 6.3

## 2011-05-23 ENCOUNTER — Ambulatory Visit (INDEPENDENT_AMBULATORY_CARE_PROVIDER_SITE_OTHER): Payer: Medicare Other | Admitting: Internal Medicine

## 2011-05-23 ENCOUNTER — Encounter: Payer: Self-pay | Admitting: Internal Medicine

## 2011-05-23 VITALS — BP 110/72 | HR 80 | Ht 59.0 in | Wt 132.6 lb

## 2011-05-23 DIAGNOSIS — J449 Chronic obstructive pulmonary disease, unspecified: Secondary | ICD-10-CM

## 2011-05-23 NOTE — Progress Notes (Signed)
  Subjective:    Patient ID: Erica [name], female    DOB: 03-30-1921, 77 y.o.   MRN: [id number]  Shortness of Breath   Documenting 6 minute walk test   Review of Systems  Respiratory: Positive for shortness of breath.        Objective:   Physical Exam        Assessment & Plan:

## 2011-05-23 NOTE — Progress Notes (Signed)
Patient ID: Erica [name], female    DOB: 1920/12/03, 77 y.o.   MRN: [id number]  HPI 12/07/10- 31 yoF seen on referral by Dr Antoine Poche. Here with daughter. I last saw her in 2008 when needle bx of a RUL nodule -> nonsmall cell cancer. This was resected by Dr Edwyna Shell with RUL lobectomy and subsequently followed by Dr Shirline Frees without XRT or Chem. No recurrence so far.  She never smoked, but her husband did. Old record reviewed, without PFT found. Recently she has been treated by Dr Antoine Poche for AFib. The question is how much of her dyspnea is pulmonary and whether her lung disease contributes to her arrythmia. She describes some chronic exertional dyspnea, worse since around the time she began coumadin. Noticed walking briskly and when lying down, relieved by rest. SOB to mailbox and back- stopping to get her breath. Breathing meds also help some. Occasional dry cough, some wheeze, and a sensation of something moving in left lower chest or upper abdomen. Known large hiatal hernia but she denies reflux. She is on warfarin, but denies anemia. CT chest 10/04/10 reviewed- S/p RUL lobectomy and Rt thyroidectomy. No mets or nodes. NAD, Nl heart size.  01/24/11- ?COPD, Hx RULobect for NSCCA .  Daughter here Comes for f/u/ Mentions pain under left scapula x months, worse with prolonged standing, not progressive, controlled with tylenol. We referred to the CT she had 4 months ago. She denies cough or wheeze, admitting her DOE may be age- appropriate.  PFT- moderate reversible slowing in small airways , DLCO 69% FEV1/FVC 0.58; DLCO 69% . Good oxygenation 99%, 98%, 99% 268 meters  05/23/11- 89 yoF?COPD, Hx RULobect for NSCCA .... daughter here We reviewed her history as a secondhand smoker while married. She again reports pain under the left scapula and at the base of the neck which are no worse and are relieved by Tylenol. PFT had demonstrated moderate COPD and she had done well on 6 minute walk test. She  denies cough or phlegm. Uses her rescue inhaler before walking outdoors and continues Symbicort and Spiriva. Dr. Shirline Frees will get a followup CT scan of her chest February for f/u of her prior lung cancer.  Review of Systems See HPI Constitutional:   No-   weight loss, night sweats, fevers, chills, fatigue, lassitude. HEENT:   No-  headaches, difficulty swallowing, tooth/dental problems, sore throat,       +sneezing,No- itching, ear ache, nasal congestion, post nasal drip,  CV:  No-   chest pain, orthopnea, PND, swelling in lower extremities, anasarca, dizziness, palpitations Resp: + shortness of breath with exertion, Not at rest.              No-   productive cough,  No non-productive cough,  No-  coughing up of blood.              No-   change in color of mucus.  No- wheezing.   Skin: No-   rash or lesions. GI:  No-   heartburn, indigestion, abdominal pain, nausea, vomiting, diarrhea,                 change in bowel habits, loss of appetite GU: No-   dysuria, change in color of urine, no urgency or frequency.  No- flank pain. MS:  No-   joint pain or swelling.  No- decreased range of motion.  + back pain. Neuro- grossly normal to observation, Or:  Psych:  No- change in mood or affect.  No depression or anxiety.  No memory loss.      Objective:   Physical Exam General- Alert, Oriented, Affect-appropriate, Distress- none acute Skin- rash-none, lesions- none, excoriation- none Lymphadenopathy- none Head- atraumatic            Eyes- Gross vision intact, PERRLA, conjunctivae clear secretions            Ears- Hearing, hard of hearing            Nose- Clear, no-Septal dev, mucus, polyps, erosion, perforation             Throat- Mallampati II , mucosa clear , drainage- none, tonsils- atrophic. Many worn teeth. Neck- flexible , trachea midline, no stridor , thyroid nl, carotid no bruit Chest - symmetrical excursion , unlabored           Heart/CV- RRR , no murmur , no gallop  , no rub, nl s1  s2                           - JVD- none , edema- none, stasis changes- none, varices- none           Lung- clear to P&A, wheeze- none, cough- none , dullness-none, rub- none. She is not particularly tender on her back her discomfort is reported.           Chest wall-  Abd- tender-no, distended-no, bowel sounds-present, HSM- no Br/ Gen/ Rectal- Not done, not indicated Extrem- cyanosis- none, clubbing, none, atrophy- none, strength- nl Neuro- grossly intact to observation

## 2011-05-23 NOTE — Patient Instructions (Signed)
Please call as needed  You can use the Proair rescue inhaler up to 4 times daily if needed. If you find you are needing it more than that, please let us know.

## 2011-05-26 ENCOUNTER — Encounter: Payer: Self-pay | Admitting: Cardiology

## 2011-05-26 NOTE — Assessment & Plan Note (Signed)
We will follow at long intervals unless needed. I have encouraged regular walking to maintain endurance.

## 2011-06-01 ENCOUNTER — Encounter: Payer: Self-pay | Admitting: Cardiology

## 2011-06-01 ENCOUNTER — Ambulatory Visit (INDEPENDENT_AMBULATORY_CARE_PROVIDER_SITE_OTHER): Payer: Medicare Other | Admitting: Cardiology

## 2011-06-01 VITALS — BP 104/62 | HR 96 | Resp 18 | Ht 60.0 in | Wt 129.0 lb

## 2011-06-01 DIAGNOSIS — I4891 Unspecified atrial fibrillation: Secondary | ICD-10-CM

## 2011-06-01 NOTE — Progress Notes (Signed)
HPI The patient presents for followup of her atrial fibrillation with rapid rate.  At the last appt I increased her beta blocker.  She tolerated this.  She reports that she is not having any new palpitations, presyncope or syncope. She is not sure whether rate control has helped her dyspnea however. She still gets short of breath walking to the mailbox which is quite a distance and covers an incline.  Dr. Maple Hudson has seen her and will follow her in six months.  He made no change to her medications.   No Known Allergies  Current Outpatient Prescriptions  Medication Sig Dispense Refill  . acetaminophen (TYLENOL) 500 MG tablet Take 500 mg by mouth every 6 (six) hours as needed.        [name] albuterol (PROAIR HFA) 108 (90 BASE) MCG/ACT inhaler Inhale 2 puffs into the lungs every 6 (six) hours as needed for wheezing or shortness of breath.  1 Inhaler  prn  . atorvastatin (LIPITOR) 10 MG tablet Take 5 mg by mouth daily.        . budesonide-formoterol (SYMBICORT) 160-4.5 MCG/ACT inhaler Inhale 2 puffs into the lungs 2 (two) times daily.        . Calcium Carbonate-Vitamin D (CALTRATE 600+D) 600-400 MG-UNIT per tablet Take 1 tablet by mouth 2 (two) times daily.        . Cholecalciferol (VITAMIN D) 1000 UNITS capsule Take 1,000 Units by mouth 2 (two) times daily.        [name] dextromethorphan-guaiFENesin (MUCINEX DM) 30-600 MG per 12 hr tablet Take 1 tablet by mouth every 12 (twelve) hours.        . Ferrous Gluconate (IRON) 240 (27 FE) MG TABS Take 1 tablet by mouth daily.        . fish oil-omega-3 fatty acids 1000 MG capsule Take 2 g by mouth daily.        . folic acid (FOLVITE) 400 MCG tablet Take 400 mcg by mouth daily.        [name] levothyroxine (SYNTHROID, LEVOTHROID) 50 MCG tablet Take 50 mcg by mouth daily.        [name] loratadine (CLARITIN) 10 MG tablet Take 10 mg by mouth daily.        . metoprolol (TOPROL XL) 100 MG 24 hr tablet Take 1 tablet (100 mg total) by mouth daily.  30 tablet  11  . montelukast (SINGULAIR)  10 MG tablet Take 10 mg by mouth at bedtime.        [name] omeprazole (PRILOSEC) 20 MG capsule Take 20 mg by mouth daily.        [name] Glycol-Propyl Glycol (SYSTANE) 0.4-0.3 % SOLN Place 1 drop into both eyes 2 (two) times daily.        [name] tiotropium (SPIRIVA) 18 MCG inhalation capsule Place 18 mcg into inhaler and inhale daily.        [name] warfarin (COUMADIN) 2.5 MG tablet as directed.          Past Medical History  Diagnosis Date  . Lung cancer 09-11-2007    Dr Edwyna Shell did surgery; Dr[facility]  . Allergic rhinitis   . Atrial fibrillation   . Hyperlipidemia   . Asthma     Past Surgical History  Procedure Date  . Inguinal hernia repair     right  . Thyroid surgery     nodule resection  . Lung lobectomy 09-11-2007    right upper lobe    ROS: Mild fatigue.  Otherwise as stated in the HPI and negative for all other systems..  PHYSICAL EXAM BP 104/62  Pulse 96  Resp 18  Ht 5' (1.524 m)  Wt 129 lb (58.514 kg)  BMI 25.19 kg/m2 GENERAL:  Well appearing HEENT:  Pupils equal round and reactive, fundi not visualized, oral mucosa unremarkable, poor dentition NECK:  No jugular venous distention, waveform within normal limits, carotid upstroke brisk and symmetric, no bruits, no thyromegaly LYMPHATICS:  No cervical, inguinal adenopathy LUNGS:  Clear to auscultation bilaterally BACK:  No CVA tenderness CHEST:  Unremarkable HEART:  PMI not displaced or sustained,S1 and S2 within normal limits, no S3, no clicks, no rubs, no murmurs, irregular ABD:  Flat, positive bowel sounds normal in frequency in pitch, no bruits, no rebound, no guarding, no midline pulsatile mass, no hepatomegaly, no splenomegaly EXT:  2 plus pulses throughout, no edema, no cyanosis no clubbing SKIN:  No rashes no nodules NEURO:  Cranial nerves II through XII grossly intact, motor grossly intact throughout PSYCH:  Cognitively intact, oriented to person place and time  ASSESSMENT AND PLAN

## 2011-06-01 NOTE — Assessment & Plan Note (Signed)
At this point I think that we have maximized the benefit of rate control.  I will make no change. She tolerates warfarin and she will continue on this.

## 2011-06-01 NOTE — Patient Instructions (Addendum)
The current medical regimen is effective;  continue present plan and medications.  Follow up in 4 months with Dr Hochrein.  You will receive a letter in the mail 2 months before you are due.  Please call us when you receive this letter to schedule your follow up appointment.  

## 2011-08-12 ENCOUNTER — Telehealth: Payer: Self-pay | Admitting: Internal Medicine

## 2011-08-12 NOTE — Telephone Encounter (Signed)
lmonvm of the pt's dtr regarding the pt's lab,ct scan and md appts in feb and march

## 2011-08-24 ENCOUNTER — Telehealth: Payer: Self-pay | Admitting: Internal Medicine

## 2011-08-24 NOTE — Telephone Encounter (Signed)
called to r/s her appt from 3/4 to 3/7 due to conflict of att,  done    aom

## 2011-09-14 ENCOUNTER — Ambulatory Visit: Payer: Medicare Other | Admitting: Cardiology

## 2011-10-06 ENCOUNTER — Ambulatory Visit (HOSPITAL_COMMUNITY)
Admission: RE | Admit: 2011-10-06 | Discharge: 2011-10-06 | Disposition: A | Discharge status: Home / Self Care | Payer: Medicare Other | Source: Ambulatory Visit | Attending: Internal Medicine | Admitting: Internal Medicine

## 2011-10-06 ENCOUNTER — Other Ambulatory Visit (HOSPITAL_BASED_OUTPATIENT_CLINIC_OR_DEPARTMENT_OTHER): Payer: Medicare Other | Admitting: Lab

## 2011-10-06 DIAGNOSIS — R0789 Other chest pain: Secondary | ICD-10-CM | POA: Insufficient documentation

## 2011-10-06 DIAGNOSIS — C3490 Malignant neoplasm of unspecified part of unspecified bronchus or lung: Secondary | ICD-10-CM | POA: Insufficient documentation

## 2011-10-06 DIAGNOSIS — R05 Cough: Secondary | ICD-10-CM | POA: Insufficient documentation

## 2011-10-06 DIAGNOSIS — J45909 Unspecified asthma, uncomplicated: Secondary | ICD-10-CM | POA: Insufficient documentation

## 2011-10-06 DIAGNOSIS — R059 Cough, unspecified: Secondary | ICD-10-CM | POA: Insufficient documentation

## 2011-10-06 DIAGNOSIS — K449 Diaphragmatic hernia without obstruction or gangrene: Secondary | ICD-10-CM | POA: Insufficient documentation

## 2011-10-06 DIAGNOSIS — K7689 Other specified diseases of liver: Secondary | ICD-10-CM | POA: Insufficient documentation

## 2011-10-06 DIAGNOSIS — I517 Cardiomegaly: Secondary | ICD-10-CM | POA: Insufficient documentation

## 2011-10-06 DIAGNOSIS — Z85118 Personal history of other malignant neoplasm of bronchus and lung: Secondary | ICD-10-CM

## 2011-10-06 DIAGNOSIS — C3410 Malignant neoplasm of upper lobe, unspecified bronchus or lung: Secondary | ICD-10-CM

## 2011-10-06 LAB — CMP (CANCER CENTER ONLY)
ALT(SGPT): 16 U/L (ref 10–47)
AST: 20 U/L (ref 11–38)
Albumin: 3.7 g/dL (ref 3.3–5.5)
Alkaline Phosphatase: 54 U/L (ref 26–84)
Potassium: 4.6 mEq/L (ref 3.3–4.7)
Sodium: 141 mEq/L (ref 128–145)
Total Protein: 6.9 g/dL (ref 6.4–8.1)

## 2011-10-06 LAB — CBC WITH DIFFERENTIAL/PLATELET
EOS%: 2.5 % (ref 0.0–7.0)
MCH: 30.7 pg (ref 25.1–34.0)
MCV: 91.5 fL (ref 79.5–101.0)
MONO%: 8.4 % (ref 0.0–14.0)
NEUT#: 3 10*3/uL (ref 1.5–6.5)
RBC: 4.36 10*6/uL (ref 3.70–5.45)
RDW: 13.5 % (ref 11.2–14.5)

## 2011-10-06 MED ORDER — IOHEXOL 300 MG/ML  SOLN
80.0000 mL | Freq: Once | INTRAMUSCULAR | Status: AC | PRN
  Administered 2011-10-06: 80 mL via INTRAVENOUS

## 2011-10-10 ENCOUNTER — Ambulatory Visit: Payer: Medicare Other | Admitting: Internal Medicine

## 2011-10-13 ENCOUNTER — Ambulatory Visit (HOSPITAL_BASED_OUTPATIENT_CLINIC_OR_DEPARTMENT_OTHER): Payer: Medicare Other | Admitting: Internal Medicine

## 2011-10-13 VITALS — BP 114/73 | HR 82 | Temp 97.0°F | Ht 60.0 in | Wt 127.6 lb

## 2011-10-13 DIAGNOSIS — C3410 Malignant neoplasm of upper lobe, unspecified bronchus or lung: Secondary | ICD-10-CM

## 2011-10-13 NOTE — Progress Notes (Signed)
[name] Telephone:([phone]) [phone]   [phone]  OFFICE PROGRESS NOTE  [name], MD, MD [address] [place] [address] [place] [address]  PRINCIPAL DIAGNOSIS:   Stage IA (T1N0MX) non-small cell lung cancer, consistent with adenocarcinoma with bronchoalveolar features, diagnosed in December 2008.  PRIOR THERAPY:  Status post right upper lobectomy under the care of Dr Edwyna Shell on September 11, 2007.  CURRENT THERAPY:  Observation.  INTERVAL HISTORY: Erica [name] 78 y.o. female returns to the clinic today for annual followup visit accompanied by her daughter. The patient has no complaints today. She denied having any weight loss or night sweats. She has no chest pain or shortness of breath, no cough or hemoptysis. She has repeat CT scan of the chest performed recently and she is here today for evaluation and discussion of her scan results.  MEDICAL HISTORY: Past Medical History  Diagnosis Date  . Lung cancer 09-11-2007    Dr Edwyna Shell did surgery; Dr. Gwenyth Bouillon at cancer ctr  . Allergic rhinitis   . Atrial fibrillation   . Hyperlipidemia   . Asthma     ALLERGIES:   has no known allergies.  MEDICATIONS:  Current Outpatient Prescriptions  Medication Sig Dispense Refill  . acetaminophen (TYLENOL) 500 MG tablet Take 500 mg by mouth every 6 (six) hours as needed.        [name] albuterol (PROAIR HFA) 108 (90 BASE) MCG/ACT inhaler Inhale 2 puffs into the lungs every 6 (six) hours as needed for wheezing or shortness of breath.  1 Inhaler  prn  . atorvastatin (LIPITOR) 10 MG tablet Take 5 mg by mouth daily.        . budesonide-formoterol (SYMBICORT) 160-4.5 MCG/ACT inhaler Inhale 2 puffs into the lungs 2 (two) times daily.        . Calcium Carbonate-Vitamin D (CALTRATE 600+D) 600-400 MG-UNIT per tablet Take 1 tablet by mouth 2 (two) times daily.        . Cholecalciferol (VITAMIN D-3 PO) Take 2,000 Units by mouth daily.      [name]  dextromethorphan-guaiFENesin (MUCINEX DM) 30-600 MG per 12 hr tablet Take 1 tablet by mouth every 12 (twelve) hours.        . Ferrous Gluconate (IRON) 240 (27 FE) MG TABS Take 1 tablet by mouth daily.        . fish oil-omega-3 fatty acids 1000 MG capsule Take 2 g by mouth daily.        . folic acid (FOLVITE) 400 MCG tablet Take 400 mcg by mouth daily.        [name] levothyroxine (SYNTHROID, LEVOTHROID) 50 MCG tablet Take 50 mcg by mouth daily.        [name] loratadine (CLARITIN) 10 MG tablet Take 10 mg by mouth daily.        . metoprolol (TOPROL XL) 100 MG 24 hr tablet Take 1 tablet (100 mg total) by mouth daily.  30 tablet  11  . montelukast (SINGULAIR) 10 MG tablet Take 10 mg by mouth at bedtime.        [name] omeprazole (PRILOSEC) 20 MG capsule Take 20 mg by mouth daily.        [name] Glycol-Propyl Glycol (SYSTANE) 0.4-0.3 % SOLN Place 1 drop into both eyes 2 (two) times daily.        [name] tiotropium (SPIRIVA) 18 MCG inhalation capsule Place 18 mcg into inhaler and inhale daily.        [name]  warfarin (COUMADIN) 2.5 MG tablet as directed.          SURGICAL HISTORY:  Past Surgical History  Procedure Date  . Inguinal hernia repair     right  . Thyroid surgery     nodule resection  . Lung lobectomy 09-11-2007    right upper lobe    REVIEW OF SYSTEMS:  A comprehensive review of systems was negative.   PHYSICAL EXAMINATION: General appearance: alert, cooperative and no distress Lymph nodes: Cervical, supraclavicular, and axillary nodes normal. Resp: clear to auscultation bilaterally Cardio: regular rate and rhythm, S1, S2 normal, no murmur, click, rub or gallop GI: soft, non-tender; bowel sounds normal; no masses,  no organomegaly Extremities: extremities normal, atraumatic, no cyanosis or edema Neurologic: Alert and oriented X 3, normal strength and tone. Normal symmetric reflexes. Normal coordination and gait  ECOG PERFORMANCE STATUS: 1 - Symptomatic but completely ambulatory  Blood pressure 114/73,  pulse 82, temperature 97 F (36.1 C), temperature source Oral, height 5' (1.524 m), weight 127 lb 9.6 oz (57.879 kg).  LABORATORY DATA: Lab Results  Component Value Date   WBC 5.6 10/06/2011   HGB 13.4 10/06/2011   HCT 39.9 10/06/2011   MCV 91.5 10/06/2011   PLT 176 10/06/2011      Chemistry      Component Value Date/Time   NA 141 10/06/2011 0823   NA 137 10/07/2010 0952   K 4.6 10/06/2011 0823   K 4.3 10/07/2010 0952   CL 99 10/06/2011 0823   CL 104 10/07/2010 0952   CO2 28 10/06/2011 0823   CO2 27 10/07/2010 0952   BUN 11 10/06/2011 0823   BUN 9 10/07/2010 0952   CREATININE 0.7 10/06/2011 0823   CREATININE 0.66 10/07/2010 0952      Component Value Date/Time   CALCIUM 9.1 10/06/2011 0823   CALCIUM 9.0 10/07/2010 0952   ALKPHOS 54 10/06/2011 0823   ALKPHOS 56 10/01/2009 0816   AST 20 10/06/2011 0823   AST 14 10/01/2009 0816   ALT 12 10/01/2009 0816   BILITOT 1.10 10/06/2011 0823   BILITOT 0.7 10/01/2009 0816       RADIOGRAPHIC STUDIES: Ct Chest W Contrast  10/06/2011  *RADIOLOGY REPORT*  Clinical Data: Follow up of lung cancer.  Status post right upper lobectomy.  Cough.  Chest pain.  Asthma.  CT CHEST WITH CONTRAST  Technique:  Multidetector CT imaging of the chest was performed following the standard protocol during bolus administration of intravenous contrast.  Contrast: 80mL OMNIPAQUE IOHEXOL 300 MG/ML IJ SOLN  Comparison: CT of 10/04/2010.  Findings: Lung windows demonstrate 2 mm right lower lobe lung nodules.  These are unchanged on images 41 and 42.  Similar findings on images 36 and 37.  These are also likely similar on the prior. 3 mm right lower lobe nodule on image 28 is unchanged. Clear left lung.  Soft tissue windows demonstrate prior right-sided thyroidectomy. Tortuous thoracic aorta.  Moderate cardiomegaly with multivessel coronary artery atherosclerosis. No pericardial or pleural effusion.  No central pulmonary embolism, on this non-dedicated study.  Pretracheal node measures 11 mm on  image 14 versus similar on image 12 of the prior.  No hilar adenopathy.  A moderate hiatal hernia  Limited abdominal imaging demonstrates a hypoattenuating right liver lobe lesion which measures 11 mm on image 50 and was similar in size on the prior.  Both adrenal glands are mildly thickened. This is similar on the left, slightly increased on the right.  No well-defined mass.  Fatty replaced pancreas.  Moderate osteopenia. Remote right rib trauma or surgical change.  Mild accentuation of expected thoracic kyphosis.  IMPRESSION: 1.  Surgical changes in the right upper lobe, without locally recurrent disease. 2.  Similar borderline to minimally enlarged pretracheal node. Stability argues for a benign etiology, but warrants followup attention. 3.  Moderate hiatal hernia. 4.  Right liver lobe hypoattenuating lesion is not significantly changed, but technically indeterminate.  Original Report Authenticated By: Consuello Bossier, M.D.    ASSESSMENT: This is a very pleasant 78 years old white female with history of stage IA non-small cell lung cancer diagnosed 4 years ago, status post right upper lobectomy. The patient is doing fine and she has no evidence for disease recurrence on his recent scan.  PLAN: I recommended for her continuous observation for now with repeat CT scan of the chest without contrast in one year. She was advised to call me immediately if she has any concerning symptoms in the interval.  All questions were answered. The patient knows to call the clinic with any problems, questions or concerns. We can certainly see the patient much sooner if necessary.

## 2011-10-14 ENCOUNTER — Other Ambulatory Visit (HOSPITAL_COMMUNITY): Payer: Self-pay | Admitting: *Deleted

## 2011-10-17 ENCOUNTER — Ambulatory Visit (HOSPITAL_COMMUNITY)
Admission: RE | Admit: 2011-10-17 | Discharge: 2011-10-17 | Disposition: A | Discharge status: Home / Self Care | Payer: Medicare Other | Source: Ambulatory Visit | Attending: Family Medicine | Admitting: Family Medicine

## 2011-10-17 DIAGNOSIS — M810 Age-related osteoporosis without current pathological fracture: Secondary | ICD-10-CM | POA: Insufficient documentation

## 2011-10-17 MED ORDER — ZOLEDRONIC ACID 5 MG/100ML IV SOLN
5.0000 mg | Freq: Once | INTRAVENOUS | Status: AC
  Administered 2011-10-17: 5 mg via INTRAVENOUS
  Filled 2011-10-17: qty 100

## 2011-10-19 ENCOUNTER — Encounter: Payer: Self-pay | Admitting: Cardiology

## 2011-10-19 ENCOUNTER — Ambulatory Visit (INDEPENDENT_AMBULATORY_CARE_PROVIDER_SITE_OTHER): Payer: Medicare Other | Admitting: Cardiology

## 2011-10-19 VITALS — BP 120/70 | HR 83 | Ht 60.0 in | Wt 127.0 lb

## 2011-10-19 DIAGNOSIS — I4891 Unspecified atrial fibrillation: Secondary | ICD-10-CM

## 2011-10-19 NOTE — Progress Notes (Signed)
Patient ID: Erica [name], female   DOB: [birth year], 78 y.o.   MRN: [id number]

## 2011-10-19 NOTE — Assessment & Plan Note (Signed)
It's unclear whether this is contributing at all to her dyspnea. She needs rate control to continue with anticoagulation. I don't think attempts at rhythm control are reasonable. I will place a Holter monitor to further determine the adequacy of rate control.

## 2011-10-19 NOTE — Assessment & Plan Note (Signed)
As above.  This is probably multifactorial given her lung disease and lung cancer previously. She seems to be euvolemic. For now she will continue the meds as listed.

## 2011-10-19 NOTE — Patient Instructions (Signed)
Your physician has recommended that you wear a holter monitor. Holter monitors are medical devices that record the heart's electrical activity. Doctors most often use these monitors to diagnose arrhythmias. Arrhythmias are problems with the speed or rhythm of the heartbeat. The monitor is a small, portable device. You can wear one while you do your normal daily activities. This is usually used to diagnose what is causing palpitations/syncope (passing out).  The current medical regimen is effective;  continue present plan and medications.  Follow up in 6 months with Dr Antoine Poche.  You will receive a letter in the mail 2 months before you are due.  Please call us when you receive this letter to schedule your follow up appointment.

## 2011-10-19 NOTE — Progress Notes (Signed)
HPI The patient presents for followup of atrial fibrillation and dyspnea. At the last visit I increased her beta blocker. She continues to have dyspnea with activities that is unchanged. He said the other day she was bending over doing some work with plants and she became very short of breath and lightheaded.  She has had no syncope. She does feel her heart racing at times. She's not describing chest pressure, neck or arm discomfort. She's not describing PND or orthopnea. She has had no cough fevers or chills. Continues to complain of back pain which has been bothered chronically. He   No Known Allergies  Current Outpatient Prescriptions  Medication Sig Dispense Refill  . acetaminophen (TYLENOL) 500 MG tablet Take 500 mg by mouth every 6 (six) hours as needed.       [name] albuterol (PROAIR HFA) 108 (90 BASE) MCG/ACT inhaler Inhale 2 puffs into the lungs every 6 (six) hours as needed for wheezing or shortness of breath.  1 Inhaler  prn  . atorvastatin (LIPITOR) 10 MG tablet Take 5 mg by mouth daily.        . budesonide-formoterol (SYMBICORT) 160-4.5 MCG/ACT inhaler Inhale 2 puffs into the lungs 2 (two) times daily.        . Calcium Carbonate-Vitamin D (CALTRATE 600+D) 600-400 MG-UNIT per tablet Take 1 tablet by mouth 2 (two) times daily.        . Cholecalciferol (VITAMIN D-3 PO) Take 2,000 Units by mouth daily.      [name] dextromethorphan-guaiFENesin (MUCINEX DM) 30-600 MG per 12 hr tablet Take 1 tablet by mouth every 12 (twelve) hours.        . Ferrous Gluconate (IRON) 240 (27 FE) MG TABS Take 1 tablet by mouth daily.        . fish oil-omega-3 fatty acids 1000 MG capsule Take 2 g by mouth daily.        . folic acid (FOLVITE) 400 MCG tablet Take 400 mcg by mouth daily.        [name] levothyroxine (SYNTHROID, LEVOTHROID) 50 MCG tablet Take 50 mcg by mouth daily.        [name] loratadine (CLARITIN) 10 MG tablet Take 10 mg by mouth daily.        . metoprolol (TOPROL XL) 100 MG 24 hr tablet Take 1 tablet (100 mg  total) by mouth daily.  30 tablet  11  . montelukast (SINGULAIR) 10 MG tablet Take 10 mg by mouth at bedtime.        [name] omeprazole (PRILOSEC) 20 MG capsule Take 20 mg by mouth daily.        [name] Glycol-Propyl Glycol (SYSTANE) 0.4-0.3 % SOLN Place 1 drop into both eyes 2 (two) times daily.        [name] tiotropium (SPIRIVA) 18 MCG inhalation capsule Place 18 mcg into inhaler and inhale daily.        [name] warfarin (COUMADIN) 2.5 MG tablet as directed.          Past Medical History  Diagnosis Date  . Lung cancer 09-11-2007    Dr Edwyna Shell did surgery; Dr. Gwenyth Bouillon at cancer ctr  . Allergic rhinitis   . Atrial fibrillation   . Hyperlipidemia   . Asthma     Past Surgical History  Procedure Date  . Inguinal hernia repair     right  . Thyroid surgery     nodule resection  . Lung lobectomy 09-11-2007    right upper lobe    ROS: Mild  fatigue.  Otherwise as stated in the HPI and negative for all other systems..  PHYSICAL EXAM BP 120/70  Pulse 83  Ht 5' (1.524 m)  Wt 127 lb (57.607 kg)  BMI 24.80 kg/m2 GENERAL: Frail appearing but in no distress HEENT:  Pupils equal round and reactive, fundi not visualized, oral mucosa unremarkable, poor dentition NECK:  No jugular venous distention, waveform within normal limits, carotid upstroke brisk and symmetric, no bruits, no thyromegaly LYMPHATICS:  No cervical, inguinal adenopathy LUNGS:  Clear to auscultation bilaterally BACK:  No CVA tenderness CHEST:  Unremarkable HEART:  PMI not displaced or sustained,S1 and S2 within normal limits, no S3, no clicks, no rubs, no murmurs, irregular ABD:  Flat, positive bowel sounds normal in frequency in pitch, no bruits, no rebound, no guarding, no midline pulsatile mass, no hepatomegaly, no splenomegaly EXT:  2 plus pulses throughout, no edema, no cyanosis no clubbing SKIN:  No rashes no nodules NEURO:  Cranial nerves II through XII grossly intact, motor grossly intact throughout PSYCH:  Cognitively intact,  oriented to person place and time  EKG:  Atrial fibrillation, rate 83, low voltage, RSR in V1 and V2, no acute ST-T wave changes. 10/19/2011   ASSESSMENT AND PLAN

## 2011-11-21 ENCOUNTER — Ambulatory Visit: Payer: Medicare Other | Admitting: Internal Medicine

## 2011-12-22 ENCOUNTER — Encounter: Payer: Self-pay | Admitting: Internal Medicine

## 2011-12-22 ENCOUNTER — Ambulatory Visit (INDEPENDENT_AMBULATORY_CARE_PROVIDER_SITE_OTHER): Payer: Medicare Other | Admitting: Internal Medicine

## 2011-12-22 VITALS — BP 102/64 | HR 86 | Ht 59.0 in | Wt 127.6 lb

## 2011-12-22 DIAGNOSIS — J449 Chronic obstructive pulmonary disease, unspecified: Secondary | ICD-10-CM

## 2011-12-22 DIAGNOSIS — J309 Allergic rhinitis, unspecified: Secondary | ICD-10-CM

## 2011-12-22 DIAGNOSIS — C3410 Malignant neoplasm of upper lobe, unspecified bronchus or lung: Secondary | ICD-10-CM

## 2011-12-22 MED ORDER — METHYLPREDNISOLONE ACETATE 80 MG/ML IJ SUSP
80.0000 mg | Freq: Once | INTRAMUSCULAR | Status: AC
  Administered 2011-12-22: 80 mg via INTRAMUSCULAR

## 2011-12-22 MED ORDER — ALBUTEROL SULFATE HFA 108 (90 BASE) MCG/ACT IN AERS: 2.0000 | INHALATION_SPRAY | Freq: Four times a day (QID) | RESPIRATORY_TRACT | Status: DC | PRN

## 2011-12-22 NOTE — Assessment & Plan Note (Signed)
Acute exacerbation c/w viral tracheobronchitis. Reviewed conservative measures. Plan depomedrol to help with breakthrough wheeze. Refill rescue inhaler w/ discussion.

## 2011-12-22 NOTE — Progress Notes (Signed)
Patient ID: Erica [name], female    DOB: 09/09/1920, 78 y.o.   MRN: [id number]  HPI 12/07/10- 27 yoF seen on referral by Dr Antoine Poche. Here with daughter. I last saw her in 2008 when needle bx of a RUL nodule -> nonsmall cell cancer. This was resected by Dr Edwyna Shell with RUL lobectomy and subsequently followed by Dr Shirline Frees without XRT or Chem. No recurrence so far.  She never smoked, but her husband did. Old record reviewed, without PFT found. Recently she has been treated by Dr Antoine Poche for AFib. The question is how much of her dyspnea is pulmonary and whether her lung disease contributes to her arrythmia. She describes some chronic exertional dyspnea, worse since around the time she began coumadin. Noticed walking briskly and when lying down, relieved by rest. SOB to mailbox and back- stopping to get her breath. Breathing meds also help some. Occasional dry cough, some wheeze, and a sensation of something moving in left lower chest or upper abdomen. Known large hiatal hernia but she denies reflux. She is on warfarin, but denies anemia. CT chest 10/04/10 reviewed- S/p RUL lobectomy and Rt thyroidectomy. No mets or nodes. NAD, Nl heart size.  01/24/11- ?COPD, Hx RULobect for NSCCA .  Daughter here Comes for f/u/ Mentions pain under left scapula x months, worse with prolonged standing, not progressive, controlled with tylenol. We referred to the CT she had 4 months ago. She denies cough or wheeze, admitting her DOE may be age- appropriate.  PFT- moderate reversible slowing in small airways , DLCO 69% FEV1/FVC 0.58; DLCO 69% . Good oxygenation 99%, 98%, 99% 268 meters  05/23/11- 89 yoF?COPD, Hx RULobect for NSCCA .... daughter here We reviewed her history as a secondhand smoker while married. She again reports pain under the left scapula and at the base of the neck which are no worse and are relieved by Tylenol. PFT had demonstrated moderate COPD and she had done well on 6 minute walk test. She  denies cough or phlegm. Uses her rescue inhaler before walking outdoors and continues Symbicort and Spiriva. Dr. Shirline Frees will get a followup CT scan of her chest February for f/u of her prior lung cancer.  12/22/11-  89 yoF Asthma/ COPD, Hx RULobect for NSCCA, complicated by AFib .... daughter here Has "cold" right now; cough-productive this morning and white in color; chest tightness, wheezing, SOB. Spring pollen did cause watery sniffing, but managed. She denies fever, sore throat or purulent sputum. Doesn't notice her wheeze. Continues meds as reviewed- needs rescue refill but not out.  Dr Shirline Frees tracks chest CT- reviewed.  CT chest 10/06/11- IMPRESSION:  1. Surgical changes in the right upper lobe, without locally  recurrent disease.  2. Similar borderline to minimally enlarged pretracheal node.  Stability argues for a benign etiology, but warrants followup  attention.  3. Moderate hiatal hernia.  4. Right liver lobe hypoattenuating lesion is not significantly  changed, but technically indeterminate.  Original Report Authenticated By: Consuello Bossier, M.D.   Review of Systems-See HPI Constitutional:   No-   weight loss, night sweats, fevers, chills, fatigue, lassitude. HEENT:   No-  headaches, difficulty swallowing, tooth/dental problems, sore throat,       +sneezing,No- itching, ear ache, nasal congestion, post nasal drip,  CV:  No-   chest pain, orthopnea, PND, swelling in lower extremities, anasarca, dizziness, palpitations Resp: + shortness of breath with exertion, Not at rest.  No-   productive cough,  No non-productive cough,  No-  coughing up of blood.              No-   change in color of mucus.  + wheezing.   Skin: No-   rash or lesions. GI:  No-   heartburn, indigestion, abdominal pain, nausea, vomiting,  GU:  MS:  No-   joint pain or swelling.   Neuro- grossly normal to observation, Or:  Psych:  No- change in mood or affect. No depression or anxiety.  No  memory loss.   Objective:   Physical Exam General- Alert, Oriented, Affect-appropriate, Distress- none acute Skin- rash-none, lesions- none, excoriation- none Lymphadenopathy- none Head- atraumatic            Eyes- Gross vision intact, PERRLA, conjunctivae clear secretions            Ears- Hearing, hard of hearing            Nose- Clear, no-Septal dev, mucus, polyps, erosion, perforation             Throat- Mallampati II , mucosa clear/ not red , drainage- none, tonsils- atrophic. Many worn/ missing teeth. Neck- flexible , trachea midline, no stridor , thyroid nl, carotid no bruit Chest - symmetrical excursion , unlabored           Heart/CV- RRR , no murmur , no gallop  , no rub, nl s1 s2                           - JVD- none , edema- none, stasis changes- none, varices- none           Lung- , +wheeze- bilateral, unlabored, cough- none , dullness-none, rub- none.            Chest wall-  Abd-  Br/ Gen/ Rectal- Not done, not indicated Extrem- cyanosis- none, clubbing, none, atrophy- none, strength- nl Neuro- grossly intact to observation

## 2011-12-22 NOTE — Assessment & Plan Note (Signed)
Seasonal exacerbation with peak spring pollens, controlled

## 2011-12-22 NOTE — Assessment & Plan Note (Signed)
CT reviewed. No recurrence, but Dr Arbutus Ped is tracking.

## 2011-12-22 NOTE — Patient Instructions (Signed)
Script sent refilling your rescue inhaler  Depo 80 today  Please call as needed

## 2011-12-22 NOTE — Progress Notes (Signed)
Addended by: Charlott Holler on: 12/22/2011 09:40 AM   Modules accepted: Orders

## 2012-04-11 ENCOUNTER — Other Ambulatory Visit: Payer: Self-pay | Admitting: Cardiology

## 2012-04-24 ENCOUNTER — Encounter: Payer: Self-pay | Admitting: Cardiology

## 2012-05-16 ENCOUNTER — Encounter: Payer: Self-pay | Admitting: Cardiology

## 2012-05-16 ENCOUNTER — Ambulatory Visit (INDEPENDENT_AMBULATORY_CARE_PROVIDER_SITE_OTHER): Payer: Medicare Other | Admitting: Cardiology

## 2012-05-16 VITALS — BP 115/70 | HR 89 | Ht 59.0 in | Wt 130.0 lb

## 2012-05-16 DIAGNOSIS — I4891 Unspecified atrial fibrillation: Secondary | ICD-10-CM

## 2012-05-16 NOTE — Progress Notes (Signed)
HPI The patient presents for followup of atrial fibrillation and dyspnea. At the last appointment she will Holter which demonstrated that her heart rate was well controlled. Since that point she's had no new complaints. She doesn't feel her fibrillation. She denies any PND or orthopnea. She denies any chest pressure, neck or arm discomfort. He continues to have dyspnea with exertion which is baseline. She's not describing PND or orthopnea.   No Known Allergies  Current Outpatient Prescriptions  Medication Sig Dispense Refill  . acetaminophen (TYLENOL) 500 MG tablet Take 500 mg by mouth every 6 (six) hours as needed.       [name] albuterol (PROAIR HFA) 108 (90 BASE) MCG/ACT inhaler Inhale 2 puffs into the lungs every 6 (six) hours as needed for wheezing or shortness of breath.  1 Inhaler  prn  . atorvastatin (LIPITOR) 10 MG tablet Take 5 mg by mouth daily.        . budesonide-formoterol (SYMBICORT) 160-4.5 MCG/ACT inhaler Inhale 2 puffs into the lungs 2 (two) times daily.        . Calcium Carbonate-Vitamin D (CALTRATE 600+D) 600-400 MG-UNIT per tablet Take 1 tablet by mouth 2 (two) times daily.        . Cholecalciferol (VITAMIN D-3 PO) Take 2,000 Units by mouth daily.      [name] dextromethorphan-guaiFENesin (MUCINEX DM) 30-600 MG per 12 hr tablet Take 1 tablet by mouth every 12 (twelve) hours.        . Ferrous Gluconate (IRON) 240 (27 FE) MG TABS Take 1 tablet by mouth daily.        . fish oil-omega-3 fatty acids 1000 MG capsule Take 2 g by mouth daily.        . folic acid (FOLVITE) 400 MCG tablet Take 400 mcg by mouth daily.        [name] guaiFENesin (ROBITUSSIN) 100 MG/5ML liquid Take 200 mg by mouth 3 (three) times daily as needed.      [name] levothyroxine (SYNTHROID, LEVOTHROID) 50 MCG tablet Take 50 mcg by mouth daily.        [name] loratadine (CLARITIN) 10 MG tablet Take 10 mg by mouth daily.        . montelukast (SINGULAIR) 10 MG tablet Take 10 mg by mouth at bedtime.        [name] omeprazole (PRILOSEC) 20 MG  capsule Take 20 mg by mouth daily.        [name] Glycol-Propyl Glycol (SYSTANE) 0.4-0.3 % SOLN Place 1 drop into both eyes 2 (two) times daily.        [name] tiotropium (SPIRIVA) 18 MCG inhalation capsule Place 18 mcg into inhaler and inhale daily.        . TOPROL XL 100 MG 24 hr tablet TAKE 1 TABLET DAILY  90 each  3  . TRAVATAN Z 0.004 % SOLN ophthalmic solution       . warfarin (COUMADIN) 2.5 MG tablet as directed.          Past Medical History  Diagnosis Date  . Lung cancer 09-11-2007    Dr Edwyna Shell did surgery; Dr. Gwenyth Bouillon at cancer ctr  . Allergic rhinitis   . Atrial fibrillation   . Hyperlipidemia   . Asthma     Past Surgical History  Procedure Date  . Inguinal hernia repair     right  . Thyroid surgery     nodule resection  . Lung lobectomy 09-11-2007    right upper lobe    ROS: Mild  fatigue.  Otherwise as stated in the HPI and negative for all other systems..  PHYSICAL EXAM BP 115/70  Pulse 89  Ht 4\' 11"  (1.499 m)  Wt 58.968 kg (130 lb)  BMI 26.26 kg/m2 GENERAL: Frail appearing but in no distress HEENT:  Pupils equal round and reactive, fundi not visualized, oral mucosa unremarkable, poor dentition NECK:  No jugular venous distention, waveform within normal limits, carotid upstroke brisk and symmetric, no bruits, no thyromegaly LUNGS:  Clear to auscultation bilaterally HEART:  PMI not displaced or sustained,S1 and S2 within normal limits, no S3, no clicks, no rubs, no murmurs, irregular ABD:  Flat, positive bowel sounds normal in frequency in pitch, no bruits, no rebound, no guarding, no midline pulsatile mass, no hepatomegaly, no splenomegaly EXT:  2 plus pulses throughout, no edema, no cyanosis no clubbing  EKG:  Atrial fibrillation, rate 89, low voltage, RSR in V1 and V2, no acute ST-T wave changes. 05/16/2012   ASSESSMENT AND PLAN  ATRIAL FIBRILLATION -  Her rate is well controlled. I did discuss with her and her daughter the possibility of using Eliquis  rather than warfarin. Her daughter inquired about the possibility of a new agent and this would have the lowest bleeding risk for her age group. She's going to call.  Dyspnea -  As above. This is probably multifactorial given her lung disease and lung cancer previously. She seems to be euvolemic. For now she will continue the meds as listed. No further testing is indicated.

## 2012-05-16 NOTE — Patient Instructions (Addendum)
The current medical regimen is effective;  continue present plan and medications.  Follow up in 1 year with Dr Antoine Poche.  You will receive a letter in the mail 2 months before you are due.  Please call us when you receive this letter to schedule your follow up appointment.   Eliquis

## 2012-06-28 ENCOUNTER — Ambulatory Visit (INDEPENDENT_AMBULATORY_CARE_PROVIDER_SITE_OTHER): Payer: Medicare Other | Admitting: Internal Medicine

## 2012-06-28 ENCOUNTER — Ambulatory Visit (INDEPENDENT_AMBULATORY_CARE_PROVIDER_SITE_OTHER)
Admission: RE | Admit: 2012-06-28 | Discharge: 2012-06-28 | Disposition: A | Discharge status: Home / Self Care | Payer: Medicare Other | Source: Ambulatory Visit | Attending: Internal Medicine | Admitting: Internal Medicine

## 2012-06-28 ENCOUNTER — Encounter: Payer: Self-pay | Admitting: Internal Medicine

## 2012-06-28 VITALS — BP 108/60 | HR 83 | Ht 59.0 in | Wt 128.0 lb

## 2012-06-28 DIAGNOSIS — J4489 Other specified chronic obstructive pulmonary disease: Secondary | ICD-10-CM

## 2012-06-28 DIAGNOSIS — Z859 Personal history of malignant neoplasm, unspecified: Secondary | ICD-10-CM

## 2012-06-28 DIAGNOSIS — J449 Chronic obstructive pulmonary disease, unspecified: Secondary | ICD-10-CM

## 2012-06-28 NOTE — Patient Instructions (Addendum)
Order- CXR  Dx  Asthma with COPD, hx lung Ca  Try taking your singulair/ montelukast pill once daily for a week, then skip a week, then take it for a week. See if you can tell if it makes any difference for your breathing, coughing or need for your rescue inhaler. If it isn't really making any difference that you can tell, then we can stop it.

## 2012-06-28 NOTE — Progress Notes (Signed)
Patient ID: Erica [name], female    DOB: [birth year], 78 y.o.   MRN: [id number]  HPI 12/07/10- 78 yoF seen on referral by Dr Antoine Poche. Here with daughter. I last saw her in 2008 when needle bx of a RUL nodule -> nonsmall cell cancer. This was resected by Dr Edwyna Shell with RUL lobectomy and subsequently followed by Dr Shirline Frees without XRT or Chem. No recurrence so far.  She never smoked, but her husband did. Old record reviewed, without PFT found. Recently she has been treated by Dr Antoine Poche for AFib. The question is how much of her dyspnea is pulmonary and whether her lung disease contributes to her arrythmia. She describes some chronic exertional dyspnea, worse since around the time she began coumadin. Noticed walking briskly and when lying down, relieved by rest. SOB to mailbox and back- stopping to get her breath. Breathing meds also help some. Occasional dry cough, some wheeze, and a sensation of something moving in left lower chest or upper abdomen. Known large hiatal hernia but she denies reflux. She is on warfarin, but denies anemia. CT chest 10/04/10 reviewed- S/p RUL lobectomy and Rt thyroidectomy. No mets or nodes. NAD, Nl heart size.  01/24/11- ?COPD, Hx RULobect for NSCCA .  Daughter here Comes for f/u/ Mentions pain under left scapula x months, worse with prolonged standing, not progressive, controlled with tylenol. We referred to the CT she had 4 months ago. She denies cough or wheeze, admitting her DOE may be age- appropriate.  PFT- moderate reversible slowing in small airways , DLCO 69% FEV1/FVC 0.58; DLCO 69% . Good oxygenation 99%, 98%, 99% 268 meters  05/23/11- 78 yoF?COPD, Hx RULobect for NSCCA .... daughter here We reviewed her history as a secondhand smoker while married. She again reports pain under the left scapula and at the base of the neck which are no worse and are relieved by Tylenol. PFT had demonstrated moderate COPD and she had done well on 6 minute walk test. She  denies cough or phlegm. Uses her rescue inhaler before walking outdoors and continues Symbicort and Spiriva. Dr. Shirline Frees will get a followup CT scan of her chest February for f/u of her prior lung cancer.  12/22/11-  78 yoF Asthma/ COPD, Hx RULobect for NSCCA, complicated by AFib .... daughter here Has "cold" right now; cough-productive this morning and white in color; chest tightness, wheezing, SOB. Spring pollen did cause watery sniffing, but managed. She denies fever, sore throat or purulent sputum. Doesn't notice her wheeze. Continues meds as reviewed- needs rescue refill but not out.  Dr Shirline Frees tracks chest CT- reviewed.  CT chest 10/06/11- IMPRESSION:  1. Surgical changes in the right upper lobe, without locally  recurrent disease.  2. Similar borderline to minimally enlarged pretracheal node.  Stability argues for a benign etiology, but warrants followup  attention.  3. Moderate hiatal hernia.  4. Right liver lobe hypoattenuating lesion is not significantly  changed, but technically indeterminate.  Original Report Authenticated By: Consuello Bossier, M.D.   06/28/12- 78 yoF Asthma/ COPD, Hx RULobect for NSCCA, complicated by AFib .... daughter here FOLLOWS FOR: SOB-resting or active; denies any wheezing, slight cough; sneezing---? allergies. Minor cough blamed on "allergy", dry. Feet swelling and started on HCTZ. Dyspnea with little change.  Review of Systems-See HPI Constitutional:   No-   weight loss, night sweats, fevers, chills, fatigue, lassitude. HEENT:   No-  headaches, difficulty swallowing, tooth/dental problems, sore throat,       +sneezing,No- itching, ear  ache, nasal congestion, post nasal drip,  CV:  No-   chest pain, orthopnea, PND, swelling in lower extremities, anasarca, dizziness, palpitations Resp: + shortness of breath with exertion, Not at rest.              No-   productive cough,  No non-productive cough,  No-  coughing up of blood.              No-   change  in color of mucus.  + wheezing.   Skin: No-   rash or lesions. GI:  No-   heartburn, indigestion, abdominal pain, nausea, vomiting,  GU:  MS:  No-   joint pain or swelling.   Neuro- nothing unusual  Psych:  No- change in mood or affect. No depression or anxiety.  No memory loss.   Objective:   Physical Exam General- Alert, Oriented, Affect-appropriate, Distress- none acute Skin- rash-none, lesions- none, excoriation- none Lymphadenopathy- none Head- atraumatic            Eyes- Gross vision intact, PERRLA, conjunctivae clear secretions            Ears- Hearing, hard of hearing            Nose- Clear, no-Septal dev, mucus, polyps, erosion, perforation             Throat- Mallampati II , mucosa clear/ not red , drainage- none, tonsils- atrophic. Many worn/ missing teeth. Neck- flexible , trachea midline, no stridor , thyroid nl, carotid no bruit Chest - symmetrical excursion , unlabored           Heart/CV- RRR , no murmur , no gallop  , no rub, nl s1 s2                           - JVD- none , edema- none-ankles do not pit, stasis changes- none, varices- none           Lung- , no-wheeze , unlabored, cough- none , dullness-none, rub- none.            Chest wall-  Abd-  Br/ Gen/ Rectal- Not done, not indicated Extrem- cyanosis- none, clubbing, none, atrophy- none, strength- nl Neuro- grossly intact to observation

## 2012-07-02 ENCOUNTER — Other Ambulatory Visit: Payer: Self-pay | Admitting: Family Medicine

## 2012-07-02 DIAGNOSIS — I739 Peripheral vascular disease, unspecified: Secondary | ICD-10-CM

## 2012-07-03 ENCOUNTER — Ambulatory Visit (HOSPITAL_COMMUNITY): Payer: Medicare Other

## 2012-07-12 NOTE — Assessment & Plan Note (Addendum)
Some variability with weather but overall control is about as good as can be expected. Plan-chest x-ray, trial off Singulair

## 2012-10-02 ENCOUNTER — Other Ambulatory Visit (HOSPITAL_COMMUNITY): Payer: Self-pay | Admitting: *Deleted

## 2012-10-02 MED ORDER — ZOLEDRONIC ACID 5 MG/100ML IV SOLN: 5.0000 mg | Freq: Once | INTRAVENOUS | Status: AC

## 2012-10-03 ENCOUNTER — Encounter (HOSPITAL_COMMUNITY)
Admission: RE | Admit: 2012-10-03 | Discharge: 2012-10-03 | Disposition: A | Discharge status: Home / Self Care | Payer: Medicare Other | Source: Ambulatory Visit | Attending: Family Medicine | Admitting: Family Medicine

## 2012-10-03 DIAGNOSIS — M818 Other osteoporosis without current pathological fracture: Secondary | ICD-10-CM | POA: Insufficient documentation

## 2012-10-03 MED ORDER — ZOLEDRONIC ACID 5 MG/100ML IV SOLN
INTRAVENOUS | Status: AC
  Filled 2012-10-03: qty 100

## 2012-10-03 MED ORDER — ZOLEDRONIC ACID 5 MG/100ML IV SOLN
5.0000 mg | Freq: Once | INTRAVENOUS | Status: AC
  Administered 2012-10-03: 5 mg via INTRAVENOUS

## 2012-10-10 ENCOUNTER — Other Ambulatory Visit (HOSPITAL_BASED_OUTPATIENT_CLINIC_OR_DEPARTMENT_OTHER): Payer: Medicare Other | Admitting: Lab

## 2012-10-10 ENCOUNTER — Encounter (HOSPITAL_COMMUNITY): Payer: Self-pay

## 2012-10-10 ENCOUNTER — Ambulatory Visit (HOSPITAL_COMMUNITY)
Admission: RE | Admit: 2012-10-10 | Discharge: 2012-10-10 | Disposition: A | Discharge status: Home / Self Care | Payer: Medicare Other | Source: Ambulatory Visit | Attending: Internal Medicine | Admitting: Internal Medicine

## 2012-10-10 DIAGNOSIS — R0602 Shortness of breath: Secondary | ICD-10-CM | POA: Insufficient documentation

## 2012-10-10 DIAGNOSIS — C3490 Malignant neoplasm of unspecified part of unspecified bronchus or lung: Secondary | ICD-10-CM | POA: Insufficient documentation

## 2012-10-10 DIAGNOSIS — E0789 Other specified disorders of thyroid: Secondary | ICD-10-CM | POA: Insufficient documentation

## 2012-10-10 DIAGNOSIS — C3410 Malignant neoplasm of upper lobe, unspecified bronchus or lung: Secondary | ICD-10-CM

## 2012-10-10 DIAGNOSIS — I2510 Atherosclerotic heart disease of native coronary artery without angina pectoris: Secondary | ICD-10-CM | POA: Insufficient documentation

## 2012-10-10 DIAGNOSIS — K7689 Other specified diseases of liver: Secondary | ICD-10-CM | POA: Insufficient documentation

## 2012-10-10 DIAGNOSIS — K449 Diaphragmatic hernia without obstruction or gangrene: Secondary | ICD-10-CM | POA: Insufficient documentation

## 2012-10-10 LAB — COMPREHENSIVE METABOLIC PANEL (CC13)
Albumin: 3.4 g/dL — ABNORMAL LOW (ref 3.5–5.0)
Alkaline Phosphatase: 63 U/L (ref 40–150)
BUN: 10.7 mg/dL (ref 7.0–26.0)
Glucose: 92 mg/dl (ref 70–99)
Total Bilirubin: 0.77 mg/dL (ref 0.20–1.20)

## 2012-10-10 LAB — CBC WITH DIFFERENTIAL/PLATELET
Basophils Absolute: 0 10*3/uL (ref 0.0–0.1)
Eosinophils Absolute: 0.1 10*3/uL (ref 0.0–0.5)
HGB: 12.7 g/dL (ref 11.6–15.9)
LYMPH%: 38.1 % (ref 14.0–49.7)
MCV: 90 fL (ref 79.5–101.0)
MONO%: 8.9 % (ref 0.0–14.0)
NEUT#: 2.2 10*3/uL (ref 1.5–6.5)
Platelets: 158 10*3/uL (ref 145–400)
RBC: 4.15 10*6/uL (ref 3.70–5.45)

## 2012-10-11 ENCOUNTER — Encounter: Payer: Self-pay | Admitting: Internal Medicine

## 2012-10-11 ENCOUNTER — Ambulatory Visit (HOSPITAL_BASED_OUTPATIENT_CLINIC_OR_DEPARTMENT_OTHER): Payer: Medicare Other | Admitting: Internal Medicine

## 2012-10-11 DIAGNOSIS — Z85118 Personal history of other malignant neoplasm of bronchus and lung: Secondary | ICD-10-CM

## 2012-10-11 NOTE — Progress Notes (Signed)
[facility] [phone]   [phone]  OFFICE PROGRESS NOTE  [name], MD [address] [place] [address]  PRINCIPAL DIAGNOSIS: Stage IA (T1N0MX) non-small cell lung cancer, consistent with adenocarcinoma with bronchoalveolar features, diagnosed in December 2008.   PRIOR THERAPY: Status post right upper lobectomy under the care of Dr Edwyna Shell on September 11, 2007.   CURRENT THERAPY: Observation.  INTERVAL HISTORY: Erica [name] 79 y.o. female returns to the clinic today for routine annual followup visit accompanied by her daughter. The patient is feeling fine today with no specific complaints. She denied having any significant weight loss or night sweats. She denied having any chest pain, shortness breath, cough or hemoptysis. She had repeat CT scan of the chest performed recently and she is here today for evaluation and discussion of her scan results.  MEDICAL HISTORY: Past Medical History  Diagnosis Date  . Lung cancer 09-11-2007    Dr Edwyna Shell did surgery; Dr. Gwenyth Bouillon at cancer c[facility]  . Allergic rhinitis   . Atrial fibrillation   . Hyperlipidemia   . Asthma     ALLERGIES:  has No Known Allergies.  MEDICATIONS:  Current Outpatient Prescriptions  Medication Sig Dispense Refill  . acetaminophen (TYLENOL) 500 MG tablet Take 500 mg by mouth every 6 (six) hours as needed.       [name] albuterol (PROAIR HFA) 108 (90 BASE) MCG/ACT inhaler Inhale 2 puffs into the lungs every 6 (six) hours as needed for wheezing or shortness of breath.  1 Inhaler  prn  . atorvastatin (LIPITOR) 10 MG tablet Take 5 mg by mouth daily.        . budesonide-formoterol (SYMBICORT) 160-4.5 MCG/ACT inhaler Inhale 2 puffs into the lungs 2 (two) times daily.        . Calcium Carbonate-Vitamin D (CALTRATE 600+D) 600-400 MG-UNIT per tablet Take 1 tablet by mouth 2 (two) times daily.        . Cholecalciferol (VITAMIN D-3 PO) Take 2,000 Units by mouth daily.      [name]  dextromethorphan-guaiFENesin (MUCINEX DM) 30-600 MG per 12 hr tablet Take 1 tablet by mouth every 12 (twelve) hours as needed.       . Ferrous Gluconate (IRON) 240 (27 FE) MG TABS Take 1 tablet by mouth daily.        . fish oil-omega-3 fatty acids 1000 MG capsule Take 2 g by mouth daily.        . folic acid (FOLVITE) 400 MCG tablet Take 400 mcg by mouth daily.        [name] guaiFENesin (ROBITUSSIN) 100 MG/5ML liquid Take 200 mg by mouth 3 (three) times daily as needed.      [name] levothyroxine (SYNTHROID, LEVOTHROID) 50 MCG tablet Take 50 mcg by mouth daily.        [name] loratadine (CLARITIN) 10 MG tablet Take 10 mg by mouth daily.        . montelukast (SINGULAIR) 10 MG tablet Take 10 mg by mouth at bedtime.        [name] omeprazole (PRILOSEC) 20 MG capsule Take 20 mg by mouth daily.        [name] Glycol-Propyl Glycol (SYSTANE) 0.4-0.3 % SOLN Place 1 drop into both eyes 2 (two) times daily.        [name] tiotropium (SPIRIVA) 18 MCG inhalation capsule Place 18 mcg into inhaler and inhale daily.        . TOPROL XL 100 MG 24 hr  tablet TAKE 1 TABLET DAILY  90 each  3  . TRAVATAN Z 0.004 % SOLN ophthalmic solution       . warfarin (COUMADIN) 2.5 MG tablet as directed.         No current facility-administered medications for this visit.   Facility-Administered Medications Ordered in Other Visits  Medication Dose Route Frequency Provider Last Rate Last Dose  . zoledronic acid (RECLAST) injection 5 mg  5 mg Intravenous Once Delbert Harness, MD        SURGICAL HISTORY:  Past Surgical History  Procedure Laterality Date  . Inguinal hernia repair      right  . Thyroid surgery      nodule resection  . Lung lobectomy  09-11-2007    right upper lobe    REVIEW OF SYSTEMS:  A comprehensive review of systems was negative.   PHYSICAL EXAMINATION: General appearance: alert, cooperative and no distress Head: Normocephalic, without obvious abnormality, atraumatic Neck: no adenopathy Lymph nodes: Cervical,  supraclavicular, and axillary nodes normal. Resp: clear to auscultation bilaterally Cardio: regular rate and rhythm, S1, S2 normal, no murmur, click, rub or gallop GI: soft, non-tender; bowel sounds normal; no masses,  no organomegaly Extremities: extremities normal, atraumatic, no cyanosis or edema  ECOG PERFORMANCE STATUS: 1 - Symptomatic but completely ambulatory  Blood pressure 112/72, pulse 77, temperature 97.1 F (36.2 C), temperature source Oral, resp. rate 18, height 5' (1.524 m), weight 131 lb (59.421 kg).  LABORATORY DATA: Lab Results  Component Value Date   WBC 4.5 10/10/2012   HGB 12.7 10/10/2012   HCT 37.4 10/10/2012   MCV 90.0 10/10/2012   PLT 158 10/10/2012      Chemistry      Component Value Date/Time   NA 142 10/10/2012 0823   NA 141 10/06/2011 0823   NA 137 10/07/2010 0952   K 4.1 10/10/2012 0823   K 4.6 10/06/2011 0823   K 4.3 10/07/2010 0952   CL 108* 10/10/2012 0823   CL 99 10/06/2011 0823   CL 104 10/07/2010 0952   CO2 26 10/10/2012 0823   CO2 28 10/06/2011 0823   CO2 27 10/07/2010 0952   BUN 10.7 10/10/2012 0823   BUN 11 10/06/2011 0823   BUN 9 10/07/2010 0952   CREATININE 0.6 10/10/2012 0823   CREATININE 0.7 10/06/2011 0823   CREATININE 0.66 10/07/2010 0952      Component Value Date/Time   CALCIUM 8.5 10/10/2012 0823   CALCIUM 9.1 10/06/2011 0823   CALCIUM 9.0 10/07/2010 0952   ALKPHOS 63 10/10/2012 0823   ALKPHOS 54 10/06/2011 0823   ALKPHOS 56 10/01/2009 0816   AST 15 10/10/2012 0823   AST 20 10/06/2011 0823   AST 14 10/01/2009 0816   ALT 13 10/10/2012 0823   ALT 12 10/01/2009 0816   BILITOT 0.77 10/10/2012 0823   BILITOT 1.10 10/06/2011 0823   BILITOT 0.7 10/01/2009 0816       RADIOGRAPHIC STUDIES: Ct Chest Wo Contrast  10/10/2012  *RADIOLOGY REPORT*  Clinical Data: Lung cancer.  Shortness of breath.  CT CHEST WITHOUT CONTRAST  Technique:  Multidetector CT imaging of the chest was performed following the standard protocol without IV contrast.  Comparison: 10/06/2011  Findings: No  axillary lymphadenopathy.  The patient is status post right thyroidectomy.  The precarinal nodal conglomeration is stable in 13 x 11 mm when measured at the same level and in the same axes as it was on the previous study.  Otherwise no mediastinal or  hilar lymphadenopathy is evident.  The heart is enlarged. Coronary artery calcification is noted.  Moderate-to-large hiatal hernia again noted.  The patient is status post right lung surgery, as before.  No pulmonary parenchymal nodule or mass.  11 mm low density lesion in the posterior right liver is stable. Left adrenal gland thickening is also unchanged and has a average attenuation of 0 HU, suggesting adenoma.  Bone windows reveal no worrisome lytic or sclerotic osseous lesions.  IMPRESSION: Stable exam.  No new or progressive disease in this patient status post right lung surgery for lung cancer.  Moderate-to-large hiatal hernia.  Stable low density right liver lesion.   Original Report Authenticated By: Kennith Center, M.D.     ASSESSMENT: This is a very pleasant 79 years old white female with history of stage IA non-small cell lung cancer status post right upper lobectomy and has been observation since February of 2009 with no evidence for disease recurrence.   PLAN: I discussed the scan results with the patient and her daughter. I recommended for her to continue on observation with her primary care physician with an annual chest x-ray. I would see the patient as-needed basis at this point. She was advised to call me immediately if she has any concerning symptoms or abnormalities on her imaging studies  All questions were answered. The patient knows to call the clinic with any problems, questions or concerns. We can certainly see the patient much sooner if necessary.

## 2012-10-11 NOTE — Patient Instructions (Signed)
No evidence for disease recurrence on the recent scan. Followup with your primary care physician. Followup with me on as-needed basis.

## 2012-10-29 ENCOUNTER — Ambulatory Visit (INDEPENDENT_AMBULATORY_CARE_PROVIDER_SITE_OTHER): Payer: Medicare Other | Admitting: Pharmacist

## 2012-10-29 DIAGNOSIS — I4891 Unspecified atrial fibrillation: Secondary | ICD-10-CM

## 2012-10-29 LAB — POCT INR: INR: 2.1

## 2012-11-23 ENCOUNTER — Encounter: Payer: Self-pay | Admitting: Family Medicine

## 2012-11-26 ENCOUNTER — Other Ambulatory Visit: Payer: Self-pay | Admitting: Family Medicine

## 2012-11-26 ENCOUNTER — Encounter: Payer: Self-pay | Admitting: *Deleted

## 2012-11-26 NOTE — Telephone Encounter (Signed)
This encounter was created in error - please disregard.

## 2012-11-27 ENCOUNTER — Other Ambulatory Visit: Payer: Self-pay | Admitting: Family Medicine

## 2012-11-29 ENCOUNTER — Other Ambulatory Visit: Payer: Self-pay | Admitting: *Deleted

## 2012-11-29 ENCOUNTER — Ambulatory Visit (INDEPENDENT_AMBULATORY_CARE_PROVIDER_SITE_OTHER): Payer: Medicare Other | Admitting: Pharmacist

## 2012-11-29 DIAGNOSIS — I4891 Unspecified atrial fibrillation: Secondary | ICD-10-CM

## 2012-11-29 MED ORDER — WARFARIN SODIUM 5 MG PO TABS: ORAL_TABLET | ORAL | Status: DC

## 2012-11-29 NOTE — Patient Instructions (Signed)
Anticoagulation Dose Instructions as of 11/29/2012     Erica [name] Tue Wed Thu Fri Sat   New Dose 5 mg 2.5 mg 5 mg 2.5 mg 5 mg 2.5 mg 5 mg    Description       No change in dosage today.      INR was 2.4 today

## 2012-12-17 ENCOUNTER — Encounter: Payer: Self-pay | Admitting: Family Medicine

## 2012-12-17 ENCOUNTER — Ambulatory Visit (INDEPENDENT_AMBULATORY_CARE_PROVIDER_SITE_OTHER): Payer: Medicare Other | Admitting: Family Medicine

## 2012-12-17 VITALS — BP 121/74 | HR 97 | Temp 97.2°F | Ht 59.5 in | Wt 132.8 lb

## 2012-12-17 DIAGNOSIS — D500 Iron deficiency anemia secondary to blood loss (chronic): Secondary | ICD-10-CM

## 2012-12-17 DIAGNOSIS — R0609 Other forms of dyspnea: Secondary | ICD-10-CM

## 2012-12-17 DIAGNOSIS — E785 Hyperlipidemia, unspecified: Secondary | ICD-10-CM

## 2012-12-17 DIAGNOSIS — M818 Other osteoporosis without current pathological fracture: Secondary | ICD-10-CM

## 2012-12-17 DIAGNOSIS — Z85118 Personal history of other malignant neoplasm of bronchus and lung: Secondary | ICD-10-CM

## 2012-12-17 DIAGNOSIS — E559 Vitamin D deficiency, unspecified: Secondary | ICD-10-CM

## 2012-12-17 DIAGNOSIS — E039 Hypothyroidism, unspecified: Secondary | ICD-10-CM

## 2012-12-17 DIAGNOSIS — C3410 Malignant neoplasm of upper lobe, unspecified bronchus or lung: Secondary | ICD-10-CM

## 2012-12-17 DIAGNOSIS — R0600 Dyspnea, unspecified: Secondary | ICD-10-CM

## 2012-12-17 DIAGNOSIS — R0989 Other specified symptoms and signs involving the circulatory and respiratory systems: Secondary | ICD-10-CM

## 2012-12-17 DIAGNOSIS — I4891 Unspecified atrial fibrillation: Secondary | ICD-10-CM

## 2012-12-17 LAB — HEPATIC FUNCTION PANEL
AST: 22 U/L (ref 0–37)
Albumin: 4.2 g/dL (ref 3.5–5.2)
Alkaline Phosphatase: 58 U/L (ref 39–117)
Total Bilirubin: 0.8 mg/dL (ref 0.3–1.2)
Total Protein: 6.5 g/dL (ref 6.0–8.3)

## 2012-12-17 LAB — POCT CBC
Granulocyte percent: 59.6 %G (ref 37–80)
Hemoglobin: 13.1 g/dL (ref 12.2–16.2)
Lymph, poc: 1.9 (ref 0.6–3.4)
MPV: 6.4 fL (ref 0–99.8)
POC Granulocyte: 3.1 (ref 2–6.9)
POC LYMPH PERCENT: 37.2 %L (ref 10–50)
Platelet Count, POC: 186 10*3/uL (ref 142–424)
RBC: 4.5 M/uL (ref 4.04–5.48)

## 2012-12-17 LAB — LIPID PANEL
LDL Cholesterol: 71 mg/dL (ref 0–99)
Triglycerides: 83 mg/dL (ref <150)

## 2012-12-17 LAB — BASIC METABOLIC PANEL WITH GFR
BUN: 13 mg/dL (ref 6–23)
Calcium: 9.4 mg/dL (ref 8.4–10.5)
Chloride: 105 mEq/L (ref 96–112)
Creat: 0.6 mg/dL (ref 0.50–1.10)
GFR, Est Non African American: 81 mL/min

## 2012-12-17 LAB — THYROID PANEL WITH TSH
Free Thyroxine Index: 3.9 (ref 1.0–3.9)
T3 Uptake: 36.3 % (ref 22.5–37.0)
T4, Total: 10.7 ug/dL (ref 5.0–12.5)
TSH: 2.334 u[IU]/mL (ref 0.350–4.500)

## 2012-12-17 MED ORDER — BUDESONIDE-FORMOTEROL FUMARATE 160-4.5 MCG/ACT IN AERO: INHALATION_SPRAY | RESPIRATORY_TRACT | Status: DC

## 2012-12-17 MED ORDER — OMEPRAZOLE 20 MG PO CPDR: 20.0000 mg | DELAYED_RELEASE_CAPSULE | Freq: Every day | ORAL | Status: DC

## 2012-12-17 NOTE — Progress Notes (Signed)
Subjective:    Patient ID: Erica [name], female    DOB: Dec 11, 1920, 79 y.o.   MRN: [id number]  HPI This patient presents for recheck of multiple medical problems. Her daughter accompanies the patient today.  Patient Active Problem List   Diagnosis Date Noted  . Hypothyroidism 12/17/2012  . Asthma with COPD 01/24/2011  . Dyspnea 12/11/2010  . ATRIAL FIBRILLATION 02/05/2010  . IRON DEFICIENCY ANEMIA SECONDARY TO BLOOD LOSS 07/31/2007  . DIVERTICULOSIS OF COLON 07/31/2007  . IDIOPATHIC OSTEOPOROSIS 07/31/2007  . ALLERGIC RHINITIS 07/28/2007  . HIATAL HERNIA 07/28/2007  . LUNG CANCER, UPPER LOBE 07/24/2007    In addition, see recent note from Dr. Shirline Frees and CT report of chest. He wishes her to have a chest x-ray yearly starting in March of 2015  The allergies, current medications, past medical history, surgical history, family and social history are reviewed.  Immunizations reviewed.  Health maintenance reviewed.  The following items are outstanding: None      Review of Systems  Constitutional: Positive for fatigue.  HENT: Positive for sneezing (due to allergies).   Respiratory: Positive for cough (dry, related to allergies) and shortness of breath (with exertion).   Cardiovascular: Positive for chest pain (frequent with activity) and leg swelling.  Gastrointestinal: Negative.   Genitourinary: Negative.   Musculoskeletal: Positive for back pain (mid back) and arthralgias (knees, ankles).  Skin: Negative.   Allergic/Immunologic: Positive for environmental allergies (seasonal).  Neurological: Positive for dizziness (occasional).  Hematological: Bruises/bleeds easily (due to meds).  Psychiatric/Behavioral: Positive for sleep disturbance (occasional). The patient is nervous/anxious (occasional).        Objective:   Physical Exam BP 121/74  Pulse 97  Temp(Src) 97.2 F (36.2 C) (Oral)  Ht 4' 11.5" (1.511 m)  Wt 132 lb 12.8 oz (60.238 kg)  BMI 26.38 kg/m2  The  patient appeared well nourished and normally developed, alert and oriented to time and place. Speech, behavior and judgement appear normal. Vital signs as documented.  Head exam is unremarkable. No scleral icterus or pallor noted. The patient was edentulous. Mouth and throat were normal. There was some rhinorrhea.  Neck is without jugular venous distension, thyromegally, or carotid bruits. Carotid upstrokes are brisk bilaterally. No cervical adenopathy. Lungs are clear anteriorly and posteriorly to auscultation with diminished breath sounds on the right.. Normal respiratory effort. Cardiac exam reveals irregular irregular rate and rhythm at 72 per minute. First and second heart sounds normal. She did have a grade 2/6 systolic ejection murmur.No  rubs or gallops.  Abdominal exam reveals normal bowl sounds, no masses, no organomegaly and no aortic enlargement. No inguinal adenopathy. Extremities are minimally  edematous and both femoral and pedal pulses are normal. Skin without pallor or jaundice.  Warm and dry, without rash. Neurologic exam reveals normal deep tendon reflexes and normal sensation.          Assessment & Plan:  1. A-fib - BASIC METABOLIC PANEL WITH GFR; Standing  2. Malignant neoplasm of upper lobe, bronchus or lung, unspecified laterality  3. Idiopathic osteoporosis  4. Dyspnea - BASIC METABOLIC PANEL WITH GFR; Standing  5. Atrial fibrillation  6. IRON DEFICIENCY ANEMIA SECONDARY TO BLOOD LOSS - POCT CBC  7. Hypothyroidism - Thyroid Panel With TSH  8. Vitamin D deficiency - Vitamin D 25 hydroxy; Standing  9. History of lung cancer - CT Chest Wo Contrast; Future  10. Hyperlipemia - BASIC METABOLIC PANEL WITH GFR; Standing - Hepatic function panel; Standing - Lipid panel; Standing  11.  Ear cerumen  Patient Instructions  Continue therapeutic lifestyle changes Don't climb, don't fall ! De brox for ear wax softening, 3-4 drops for 2-3 nights repeat again  in one week

## 2012-12-17 NOTE — Addendum Note (Signed)
Addended by: Ernestina Penna on: 12/17/2012 11:10 AM   Modules accepted: Orders

## 2012-12-17 NOTE — Patient Instructions (Addendum)
Continue therapeutic lifestyle changes Don't climb, don't fall ! De brox for ear wax softening, 3-4 drops for 2-3 nights repeat again in one week

## 2012-12-17 NOTE — Addendum Note (Signed)
Addended by: Lisbeth Ply C on: 12/17/2012 11:03 AM   Modules accepted: Orders

## 2012-12-18 LAB — VITAMIN D 25 HYDROXY (VIT D DEFICIENCY, FRACTURES): Vit D, 25-Hydroxy: 34 ng/mL (ref 30–89)

## 2012-12-25 ENCOUNTER — Ambulatory Visit (INDEPENDENT_AMBULATORY_CARE_PROVIDER_SITE_OTHER): Payer: Medicare Other | Admitting: Pharmacist Clinician (PhC)/ Clinical Pharmacy Specialist

## 2012-12-25 DIAGNOSIS — I4891 Unspecified atrial fibrillation: Secondary | ICD-10-CM

## 2012-12-25 LAB — POCT INR: INR: 1.2

## 2012-12-26 ENCOUNTER — Ambulatory Visit (INDEPENDENT_AMBULATORY_CARE_PROVIDER_SITE_OTHER): Payer: Medicare Other | Admitting: Internal Medicine

## 2012-12-26 ENCOUNTER — Encounter: Payer: Self-pay | Admitting: Internal Medicine

## 2012-12-26 VITALS — BP 124/70 | HR 85 | Ht 59.0 in | Wt 134.0 lb

## 2012-12-26 DIAGNOSIS — J449 Chronic obstructive pulmonary disease, unspecified: Secondary | ICD-10-CM

## 2012-12-26 DIAGNOSIS — J309 Allergic rhinitis, unspecified: Secondary | ICD-10-CM

## 2012-12-26 MED ORDER — ALBUTEROL SULFATE HFA 108 (90 BASE) MCG/ACT IN AERS: 2.0000 | INHALATION_SPRAY | Freq: Four times a day (QID) | RESPIRATORY_TRACT | Status: DC | PRN

## 2012-12-26 NOTE — Progress Notes (Signed)
Patient ID: Erica [name], female    DOB: [birth year], 79 y.o.   MRN: [id number]  HPI 12/07/10- 38 yoF seen on referral by Dr Antoine Poche. Here with daughter. I last saw her in 2008 when needle bx of a RUL nodule -> nonsmall cell cancer. This was resected by Dr Edwyna Shell with RUL lobectomy and subsequently followed by Dr Shirline Frees without XRT or Chem. No recurrence so far.  She never smoked, but her husband did. Old record reviewed, without PFT found. Recently she has been treated by Dr Antoine Poche for AFib. The question is how much of her dyspnea is pulmonary and whether her lung disease contributes to her arrythmia. She describes some chronic exertional dyspnea, worse since around the time she began coumadin. Noticed walking briskly and when lying down, relieved by rest. SOB to mailbox and back- stopping to get her breath. Breathing meds also help some. Occasional dry cough, some wheeze, and a sensation of something moving in left lower chest or upper abdomen. Known large hiatal hernia but she denies reflux. She is on warfarin, but denies anemia. CT chest 10/04/10 reviewed- S/p RUL lobectomy and Rt thyroidectomy. No mets or nodes. NAD, Nl heart size.  01/24/11- ?COPD, Hx RULobect for NSCCA .  Daughter here Comes for f/u/ Mentions pain under left scapula x months, worse with prolonged standing, not progressive, controlled with tylenol. We referred to the CT she had 4 months ago. She denies cough or wheeze, admitting her DOE may be age- appropriate.  PFT- moderate reversible slowing in small airways , DLCO 69% FEV1/FVC 0.58; DLCO 69% . Good oxygenation 99%, 98%, 99% 268 meters  05/23/11- 89 yoF?COPD, Hx RULobect for NSCCA .... daughter here We reviewed her history as a secondhand smoker while married. She again reports pain under the left scapula and at the base of the neck which are no worse and are relieved by Tylenol. PFT had demonstrated moderate COPD and she had done well on 6 minute walk test. She  denies cough or phlegm. Uses her rescue inhaler before walking outdoors and continues Symbicort and Spiriva. Dr. Shirline Frees will get a followup CT scan of her chest February for f/u of her prior lung cancer.  12/22/11-  89 yoF Asthma/ COPD, Hx RULobect for NSCCA, complicated by AFib .... daughter here Has "cold" right now; cough-productive this morning and white in color; chest tightness, wheezing, SOB. Spring pollen did cause watery sniffing, but managed. She denies fever, sore throat or purulent sputum. Doesn't notice her wheeze. Continues meds as reviewed- needs rescue refill but not out.  Dr Shirline Frees tracks chest CT- reviewed.  CT chest 10/06/11- IMPRESSION:  1. Surgical changes in the right upper lobe, without locally  recurrent disease.  2. Similar borderline to minimally enlarged pretracheal node.  Stability argues for a benign etiology, but warrants followup  attention.  3. Moderate hiatal hernia.  4. Right liver lobe hypoattenuating lesion is not significantly  changed, but technically indeterminate.  Original Report Authenticated By: Consuello Bossier, M.D.   06/28/12- 89 yoF Asthma/ COPD, Hx RULobect for NSCCA, complicated by AFib .... daughter here FOLLOWS FOR: SOB-resting or active; denies any wheezing, slight cough; sneezing---? allergies. Minor cough blamed on "allergy", dry. Feet swelling and started on HCTZ. Dyspnea with little change.  12/26/12- 89 yoF Asthma/ COPD, Hx RULobect for NSCCA, complicated by AFib .... daughter here FOLLOWS FOR: continues to have SOB with activity and at rest; wheezing, very little cough and sneezing as well. No recurrence of lung cancer.  Chronic dyspnea. Does not have oxygen or pacemaker. Reviewed medication. CT chest 10/10/12 IMPRESSION:  Stable exam. No new or progressive disease in this patient status  post right lung surgery for lung cancer.  Moderate-to-large hiatal hernia.  Stable low density right liver lesion.  Original Report  Authenticated By: Kennith Center, M.D.  Review of Systems-See HPI Constitutional:   No-   weight loss, night sweats, fevers, chills, fatigue, lassitude. HEENT:   No-  headaches, difficulty swallowing, tooth/dental problems, sore throat,       +sneezing,No- itching, ear ache, nasal congestion, post nasal drip,  CV:  No-   chest pain, orthopnea, PND, swelling in lower extremities, anasarca, dizziness, palpitations Resp: + shortness of breath with exertion, Not at rest.              No-   productive cough,  No non-productive cough,  No-  coughing up of blood.              No-   change in color of mucus.  No- wheezing.   Skin: No-   rash or lesions. GI:  No-   heartburn, indigestion, abdominal pain, nausea, vomiting,  GU:  MS:  No-   joint pain or swelling.   Neuro- nothing unusual  Psych:  No- change in mood or affect. No depression or anxiety.  No memory loss. Objective:   Physical Exam General- Alert, Oriented, Affect-appropriate, Distress- none acute Skin- rash-none, lesions- none, excoriation- none Lymphadenopathy- none Head- atraumatic            Eyes- Gross vision intact, PERRLA, conjunctivae clear secretions            Ears- Hearing, hard of hearing            Nose- Clear, no-Septal dev, mucus, polyps, erosion, perforation             Throat- Mallampati II , mucosa clear/ not red , drainage- none, tonsils- atrophic. Many worn/ missing teeth. Neck- flexible , trachea midline, no stridor , thyroid nl, carotid no bruit Chest - symmetrical excursion , unlabored           Heart/CV- IRR , no murmur , no gallop  , no rub, nl s1 s2                           - JVD- none , edema- none-ankles do not pit, stasis changes- none, varices- none           Lung- clear , no-wheeze , unlabored, cough- none , dullness-none, rub- none.            Chest wall-  Abd-  Br/ Gen/ Rectal- Not done, not indicated Extrem- cyanosis- none, clubbing, none, atrophy- none, strength- nl Neuro- grossly intact to  observation

## 2012-12-26 NOTE — Patient Instructions (Addendum)
We can continue present meds  Refill script for Proair was sent  Please call if needed

## 2013-01-07 NOTE — Assessment & Plan Note (Signed)
Moderate obstructive airways disease. Plan-refill rescue inhaler with medication discussion

## 2013-01-07 NOTE — Assessment & Plan Note (Signed)
Mild seasonal sneezing. Antihistamines discussed.

## 2013-01-10 ENCOUNTER — Ambulatory Visit (INDEPENDENT_AMBULATORY_CARE_PROVIDER_SITE_OTHER): Payer: Medicare Other | Admitting: Pharmacist

## 2013-01-10 DIAGNOSIS — I4891 Unspecified atrial fibrillation: Secondary | ICD-10-CM

## 2013-01-10 NOTE — Patient Instructions (Signed)
Anticoagulation Dose Instructions as of 01/10/2013     Glynis Smiles Tue Wed Thu Fri Sat   New Dose 5 mg 2.5 mg 5 mg 2.5 mg 5 mg 2.5 mg 5 mg    Description       1 and 1/2 tablet today, then resume 1 tablet daily except 1/2 tablet on mondays, wednesdays and fridays.        INR was 1.8 today

## 2013-02-04 ENCOUNTER — Ambulatory Visit (INDEPENDENT_AMBULATORY_CARE_PROVIDER_SITE_OTHER): Payer: Medicare Other | Admitting: Pharmacist

## 2013-02-04 DIAGNOSIS — I4891 Unspecified atrial fibrillation: Secondary | ICD-10-CM

## 2013-02-04 LAB — PROTIME-INR
INR: 1.92 — ABNORMAL HIGH (ref <1.50)
Prothrombin Time: 21.4 seconds — ABNORMAL HIGH (ref 11.6–15.2)

## 2013-02-04 LAB — POCT INR: INR: 2.2

## 2013-02-04 NOTE — Patient Instructions (Addendum)
Anticoagulation Dose Instructions as of 02/04/2013     Erica [name] Tue Wed Thu Fri Sat   New Dose 5 mg 2.5 mg 5 mg 2.5 mg 5 mg 2.5 mg 5 mg    Description       Continue 1 tablet daily except 1/2 tablet on mondays, wednesdays and fridays.        INR was 2.2 today

## 2013-03-11 ENCOUNTER — Ambulatory Visit (INDEPENDENT_AMBULATORY_CARE_PROVIDER_SITE_OTHER): Payer: Medicare Other | Admitting: Pharmacist

## 2013-03-11 DIAGNOSIS — I4891 Unspecified atrial fibrillation: Secondary | ICD-10-CM

## 2013-03-11 LAB — POCT INR: INR: 1.8

## 2013-03-11 NOTE — Patient Instructions (Addendum)
Anticoagulation Dose Instructions as of 03/11/2013     Glynis Smiles Tue Wed Thu Fri Sat   New Dose 5 mg 5 mg 5 mg 2.5 mg 5 mg 2.5 mg 5 mg    Description       Start new dose of 1 tablet daily except 1/2 tablet on wednesdays and fridays.        INR was 1.8 today

## 2013-03-11 NOTE — Progress Notes (Signed)
protime appt only see notes in anticoag track

## 2013-04-01 ENCOUNTER — Ambulatory Visit (INDEPENDENT_AMBULATORY_CARE_PROVIDER_SITE_OTHER): Payer: Medicare Other | Admitting: Pharmacist

## 2013-04-01 DIAGNOSIS — I4891 Unspecified atrial fibrillation: Secondary | ICD-10-CM

## 2013-04-01 LAB — POCT INR: INR: 2

## 2013-04-01 NOTE — Patient Instructions (Addendum)
Anticoagulation Dose Instructions as of 04/01/2013     Erica [name] Tue Wed Thu Fri Sat   New Dose 5 mg 5 mg 5 mg 2.5 mg 5 mg 2.5 mg 5 mg    Description       Start new dose of 1 tablet daily except 1/2 tablet on wednesdays and fridays.        INR was 2.0 today

## 2013-04-05 ENCOUNTER — Other Ambulatory Visit: Payer: Self-pay | Admitting: Family Medicine

## 2013-04-09 ENCOUNTER — Other Ambulatory Visit: Payer: Self-pay | Admitting: Cardiology

## 2013-04-29 ENCOUNTER — Ambulatory Visit (INDEPENDENT_AMBULATORY_CARE_PROVIDER_SITE_OTHER): Payer: Medicare Other | Admitting: Family Medicine

## 2013-04-29 ENCOUNTER — Ambulatory Visit (INDEPENDENT_AMBULATORY_CARE_PROVIDER_SITE_OTHER): Payer: Self-pay | Admitting: Pharmacist

## 2013-04-29 ENCOUNTER — Encounter: Payer: Self-pay | Admitting: Family Medicine

## 2013-04-29 VITALS — BP 147/79 | HR 72 | Temp 96.7°F | Ht 59.0 in | Wt 129.0 lb

## 2013-04-29 DIAGNOSIS — M47815 Spondylosis without myelopathy or radiculopathy, thoracolumbar region: Secondary | ICD-10-CM

## 2013-04-29 DIAGNOSIS — E039 Hypothyroidism, unspecified: Secondary | ICD-10-CM

## 2013-04-29 DIAGNOSIS — E785 Hyperlipidemia, unspecified: Secondary | ICD-10-CM

## 2013-04-29 DIAGNOSIS — D500 Iron deficiency anemia secondary to blood loss (chronic): Secondary | ICD-10-CM

## 2013-04-29 DIAGNOSIS — Z23 Encounter for immunization: Secondary | ICD-10-CM

## 2013-04-29 DIAGNOSIS — J309 Allergic rhinitis, unspecified: Secondary | ICD-10-CM

## 2013-04-29 DIAGNOSIS — C3410 Malignant neoplasm of upper lobe, unspecified bronchus or lung: Secondary | ICD-10-CM

## 2013-04-29 DIAGNOSIS — H6120 Impacted cerumen, unspecified ear: Secondary | ICD-10-CM

## 2013-04-29 DIAGNOSIS — R1909 Other intra-abdominal and pelvic swelling, mass and lump: Secondary | ICD-10-CM

## 2013-04-29 DIAGNOSIS — M479 Spondylosis, unspecified: Secondary | ICD-10-CM

## 2013-04-29 DIAGNOSIS — H6123 Impacted cerumen, bilateral: Secondary | ICD-10-CM

## 2013-04-29 DIAGNOSIS — I4891 Unspecified atrial fibrillation: Secondary | ICD-10-CM

## 2013-04-29 LAB — POCT CBC
Hemoglobin: 13.8 g/dL (ref 12.2–16.2)
Lymph, poc: 2.3 (ref 0.6–3.4)
MCH, POC: 29.5 pg (ref 27–31.2)
MCHC: 33.4 g/dL (ref 31.8–35.4)
MPV: 6.7 fL (ref 0–99.8)
POC LYMPH PERCENT: 41.7 %L (ref 10–50)
Platelet Count, POC: 183 10*3/uL (ref 142–424)

## 2013-04-29 LAB — POCT INR: INR: 1.8

## 2013-04-29 MED ORDER — TRAMADOL HCL 50 MG PO TABS: ORAL_TABLET | ORAL | Status: DC

## 2013-04-29 NOTE — Progress Notes (Signed)
Patient saw Dr Christell Constant today - charge for his visit and protime only

## 2013-04-29 NOTE — Patient Instructions (Signed)
We will give flu shot today Continue to be careful and don't put yourself at risk for falling Take medication as directed Continue aggressive therapeutic lifestyle changes We will arrange appointment for her CT of abdomen and pelvis with contrast Continue to use drops for ear cerumen

## 2013-04-29 NOTE — Progress Notes (Addendum)
Subjective:    Patient ID: Erica [name], female    DOB: 03-19-1921, 79 y.o.   MRN: [id number]  HPI Pt here for follow up of chronic medical problems. Patient has a history of lung cancer from 2008 with a lobectomy. She comes in today with her daughter.  Patient Active Problem List   Diagnosis Date Noted  . Hypothyroidism 12/17/2012  . Asthma with COPD 01/24/2011  . Dyspnea 12/11/2010  . ATRIAL FIBRILLATION 02/05/2010  . IRON DEFICIENCY ANEMIA SECONDARY TO BLOOD LOSS 07/31/2007  . DIVERTICULOSIS OF COLON 07/31/2007  . IDIOPATHIC OSTEOPOROSIS 07/31/2007  . ALLERGIC RHINITIS 07/28/2007  . HIATAL HERNIA 07/28/2007  . LUNG CANCER, UPPER LOBE 07/24/2007   Outpatient Encounter Prescriptions as of 04/29/2013  Medication Sig Dispense Refill  . acetaminophen (TYLENOL) 500 MG tablet Take 500 mg by mouth every 6 (six) hours as needed.       [name] albuterol (PROAIR HFA) 108 (90 BASE) MCG/ACT inhaler Inhale 2 puffs into the lungs every 6 (six) hours as needed for wheezing or shortness of breath.  1 Inhaler  prn  . atorvastatin (LIPITOR) 10 MG tablet TAKE 1 TABLET DAILY  30 tablet  1  . budesonide-formoterol (SYMBICORT) 160-4.5 MCG/ACT inhaler 2 puffs twice daily, rinse mouth after using  1 Inhaler  11  . Calcium Carbonate-Vitamin D (CALTRATE 600+D) 600-400 MG-UNIT per tablet Take 1 tablet by mouth 2 (two) times daily.        . Cholecalciferol (VITAMIN D-3 PO) Take 2,000 Units by mouth daily.      [name] dextromethorphan-guaiFENesin (MUCINEX DM) 30-600 MG per 12 hr tablet Take 1 tablet by mouth every 12 (twelve) hours as needed.       . Ferrous Gluconate (IRON) 240 (27 FE) MG TABS Take 1 tablet by mouth daily.        . fish oil-omega-3 fatty acids 1000 MG capsule Take 2 g by mouth daily.        . folic acid (FOLVITE) 400 MCG tablet Take 400 mcg by mouth daily.        [name] levothyroxine (SYNTHROID, LEVOTHROID) 50 MCG tablet Take 50 mcg by mouth daily.        [name] loratadine (CLARITIN) 10 MG tablet Take 10 mg by  mouth daily.        . metoprolol succinate (TOPROL-XL) 100 MG 24 hr tablet TAKE 1 TABLET DAILY  90 tablet  0  . omeprazole (PRILOSEC) 20 MG capsule Take 1 capsule (20 mg total) by mouth daily.  90 capsule  4  . Polyethyl Glycol-Propyl Glycol (SYSTANE) 0.4-0.3 % SOLN Place 1 drop into both eyes 2 (two) times daily.        [name] SPIRIVA HANDIHALER 18 MCG inhalation capsule INHALE 1 CAPSULE DAILY VIA NEBULIZER  30 capsule  3  . TRAVATAN Z 0.004 % SOLN ophthalmic solution Place 1 drop into both eyes at bedtime.       [name] warfarin (COUMADIN) 5 MG tablet TAKE AS DIRECTED  45 tablet  2  . zoledronic acid (RECLAST) 5 MG/100ML SOLN Inject 5 mg into the vein once. Given 10-03-12; gets yearly at [facility]       Facility-Administered Encounter Medications as of 04/29/2013  Medication Dose Route Frequency Provider Last Rate Last Dose  . zoledronic acid (RECLAST) injection 5 mg  5 mg Intravenous Once Delbert Harness, MD            Review of Systems  Constitutional: Negative.   HENT: Negative.  Eyes: Negative.   Respiratory: Positive for shortness of breath (at times).   Cardiovascular: Negative.   Gastrointestinal: Negative.   Endocrine: Negative.   Genitourinary: Negative.   Musculoskeletal: Positive for back pain (very bad at times) and arthralgias.  Skin: Negative.   Allergic/Immunologic: Negative.   Neurological: Negative.   Hematological: Negative.   Psychiatric/Behavioral: Negative.        Objective:   Physical Exam  Nursing note and vitals reviewed. Constitutional: She is oriented to person, place, and time. She appears well-developed and well-nourished. No distress.  For her age  HENT:  Head: Normocephalic and atraumatic.  Right Ear: External ear normal.  Left Ear: External ear normal.  Nose: Nose normal.  Mouth/Throat: Oropharynx is clear and moist.  Eyes: Conjunctivae and EOM are normal. Pupils are equal, round, and reactive to light. Right eye exhibits no discharge. Left eye  exhibits no discharge.  Neck: Normal range of motion. Neck supple. No thyromegaly present.  Cardiovascular: Normal rate, normal heart sounds and intact distal pulses.  Exam reveals no gallop and no friction rub.   No murmur heard. Irregular irregular rhythm at 72 per minute  Pulmonary/Chest: Effort normal and breath sounds normal. She has no wheezes. She has no rales.  Good breath sounds bilaterally despite right upper lobectomy  Abdominal: Soft. Bowel sounds are normal. She exhibits mass. She exhibits no distension. There is no tenderness. There is no rebound and no guarding.  New left groin mass, which is not reducible  Musculoskeletal: Normal range of motion. She exhibits no edema and no tenderness.  Lymphadenopathy:    She has no cervical adenopathy.  Neurological: She is alert and oriented to person, place, and time. She has normal reflexes. No cranial nerve deficit.  Intact for her age  Skin: Skin is warm and dry. No rash noted. No erythema.  Psychiatric: She has a normal mood and affect. Her behavior is normal. Judgment and thought content normal.   BP 147/79  Pulse 72  Temp(Src) 96.7 F (35.9 C) (Oral)  Ht 4\' 11"  (1.499 m)  Wt 129 lb (58.514 kg)  BMI 26.04 kg/m2  Ears were successfully irrigated for removal of cerumen      Assessment & Plan:   1. Groin mass in female   2. IRON DEFICIENCY ANEMIA SECONDARY TO BLOOD LOSS   3. Atrial fibrillation   4. Hypothyroidism   5. Hyperlipidemia   6. A-fib   7. Malignant neoplasm of upper lobe, bronchus or lung, right   8. ALLERGIC RHINITIS   9. Excessive cerumen in ear canal, bilateral   10. Osteoarthritis of back    Meds ordered this encounter  Medications  . traMADol (ULTRAM) 50 MG tablet    Sig: One half tablet daily if needed for severe pain    Dispense:  15 tablet    Refill:  0   Orders Placed This Encounter  Procedures  . CT Abdomen Pelvis W Contrast    Standing Status: Future     Number of Occurrences:       Standing Expiration Date: 07/29/2014    Order Specific Question:  Reason for Exam (SYMPTOM  OR DIAGNOSIS REQUIRED)    Answer:  Mass left groin    Order Specific Question:  Preferred imaging location?    Answer:  [facility]  . BMP8+EGFR  . Hepatic function panel  . NMR, lipoprofile  . POCT CBC    Patient Instructions  We will give flu shot today Continue to be careful  and don't put yourself at risk for falling Take medication as directed Continue aggressive therapeutic lifestyle changes We will arrange appointment for her CT of abdomen and pelvis with contrast Continue to use drops for ear cerumen    Nyra Capes MD

## 2013-05-01 LAB — NMR, LIPOPROFILE
LDL Particle Number: 763 nmol/L (ref <1000)
LDL Size: 21.3 nm (ref >20.5)
LP-IR Score: <25 (ref <=45)
Triglycerides by NMR: 94 mg/dL (ref <150)

## 2013-05-01 LAB — HEPATIC FUNCTION PANEL
ALT: 12 IU/L (ref 0–32)
Alkaline Phosphatase: 62 IU/L (ref 39–117)
Bilirubin, Direct: 0.23 mg/dL (ref 0.00–0.40)

## 2013-05-01 LAB — BMP8+EGFR
Calcium: 9.2 mg/dL (ref 8.6–10.2)
GFR calc non Af Amer: 84 mL/min/{1.73_m2} (ref >59)
Potassium: 4.4 mmol/L (ref 3.5–5.2)
Sodium: 141 mmol/L (ref 134–144)

## 2013-05-08 ENCOUNTER — Ambulatory Visit (HOSPITAL_COMMUNITY)
Admission: RE | Admit: 2013-05-08 | Discharge: 2013-05-08 | Disposition: A | Discharge status: Home / Self Care | Payer: Medicare Other | Source: Ambulatory Visit | Attending: Family Medicine | Admitting: Family Medicine

## 2013-05-08 DIAGNOSIS — K4020 Bilateral inguinal hernia, without obstruction or gangrene, not specified as recurrent: Secondary | ICD-10-CM | POA: Insufficient documentation

## 2013-05-08 DIAGNOSIS — R1904 Left lower quadrant abdominal swelling, mass and lump: Secondary | ICD-10-CM | POA: Insufficient documentation

## 2013-05-08 DIAGNOSIS — R1909 Other intra-abdominal and pelvic swelling, mass and lump: Secondary | ICD-10-CM

## 2013-05-08 DIAGNOSIS — K449 Diaphragmatic hernia without obstruction or gangrene: Secondary | ICD-10-CM | POA: Insufficient documentation

## 2013-05-08 MED ORDER — IOHEXOL 300 MG/ML  SOLN
100.0000 mL | Freq: Once | INTRAMUSCULAR | Status: AC | PRN
  Administered 2013-05-08: 100 mL via INTRAVENOUS

## 2013-05-10 ENCOUNTER — Other Ambulatory Visit: Payer: Self-pay | Admitting: Family Medicine

## 2013-05-13 ENCOUNTER — Ambulatory Visit (INDEPENDENT_AMBULATORY_CARE_PROVIDER_SITE_OTHER): Payer: Medicare Other | Admitting: Pharmacist

## 2013-05-13 DIAGNOSIS — I4891 Unspecified atrial fibrillation: Secondary | ICD-10-CM

## 2013-05-13 LAB — POCT INR: INR: 1.6

## 2013-05-13 NOTE — Patient Instructions (Signed)
Anticoagulation Dose Instructions as of 05/13/2013     Erica [name] Tue Wed Thu Fri Sat   New Dose 5 mg 5 mg 5 mg 5 mg 5 mg 5 mg 5 mg    Description       Take 1 and 1/2 tablets today and tomorrow,  then start new dose of 1 tablet daily.        INR was 1.6 today.

## 2013-05-13 NOTE — Progress Notes (Signed)
Discuss switching to Xarelto or Eliquis in place of warfarin. Patient is interested but just got warfarin refilled.  Will continue warfarin for now but recheck in 2 weeks.

## 2013-05-30 ENCOUNTER — Ambulatory Visit (INDEPENDENT_AMBULATORY_CARE_PROVIDER_SITE_OTHER): Payer: Medicare Other | Admitting: Pharmacist

## 2013-05-30 DIAGNOSIS — I4891 Unspecified atrial fibrillation: Secondary | ICD-10-CM

## 2013-05-30 LAB — POCT INR: INR: 2.9

## 2013-05-30 NOTE — Patient Instructions (Signed)
Anticoagulation Dose Instructions as of 05/30/2013     Glynis Smiles Tue Wed Thu Fri Sat   New Dose 5 mg 5 mg 5 mg 5 mg 5 mg 5 mg 5 mg    Description       Continue current dose  of 1 tablet (= 5 mg ) daily      INR was 2.9 today

## 2013-06-12 ENCOUNTER — Encounter: Payer: Self-pay | Admitting: Cardiology

## 2013-06-12 ENCOUNTER — Ambulatory Visit (INDEPENDENT_AMBULATORY_CARE_PROVIDER_SITE_OTHER): Payer: Medicare Other | Admitting: Cardiology

## 2013-06-12 VITALS — BP 122/75 | HR 96 | Ht <= 58 in | Wt 131.0 lb

## 2013-06-12 DIAGNOSIS — I4891 Unspecified atrial fibrillation: Secondary | ICD-10-CM

## 2013-06-12 NOTE — Patient Instructions (Signed)
The current medical regimen is effective;  continue present plan and medications.  Follow up in 1 year with Dr Hochrein.  You will receive a letter in the mail 2 months before you are due.  Please call us when you receive this letter to schedule your follow up appointment.  

## 2013-06-12 NOTE — Progress Notes (Signed)
HPI The patient presents for followup of atrial fibrillation and dyspnea.  Since I last saw her she's had no new complaints. She doesn't feel her fibrillation. She denies any PND or orthopnea. She denies any chest pressure, neck or arm discomfort. He continues to have dyspnea with exertion which is baseline. She's not describing PND or orthopnea.  She has some mild bruising but she's had no other issues with her warfarin. She does report having dark black stools or red blood.   No Known Allergies  Current Outpatient Prescriptions  Medication Sig Dispense Refill  . acetaminophen (TYLENOL) 500 MG tablet Take 500 mg by mouth every 6 (six) hours as needed.       [name] albuterol (PROAIR HFA) 108 (90 BASE) MCG/ACT inhaler Inhale 2 puffs into the lungs every 6 (six) hours as needed for wheezing or shortness of breath.  1 Inhaler  prn  . atorvastatin (LIPITOR) 10 MG tablet TAKE 1 TABLET DAILY  30 tablet  1  . budesonide-formoterol (SYMBICORT) 160-4.5 MCG/ACT inhaler 2 puffs twice daily, rinse mouth after using  1 Inhaler  11  . Calcium Carbonate-Vitamin D (CALTRATE 600+D) 600-400 MG-UNIT per tablet Take 1 tablet by mouth 2 (two) times daily.        [name] dextromethorphan-guaiFENesin (MUCINEX DM) 30-600 MG per 12 hr tablet Take 1 tablet by mouth every 12 (twelve) hours as needed.       . Ferrous Gluconate (IRON) 240 (27 FE) MG TABS Take 1 tablet by mouth daily.        . fish oil-omega-3 fatty acids 1000 MG capsule Take 2 g by mouth daily.        . folic acid (FOLVITE) 400 MCG tablet Take 400 mcg by mouth daily.        [name] levothyroxine (SYNTHROID, LEVOTHROID) 50 MCG tablet Take 50 mcg by mouth daily.        [name] loratadine (CLARITIN) 10 MG tablet Take 10 mg by mouth daily.        . metoprolol succinate (TOPROL-XL) 100 MG 24 hr tablet TAKE 1 TABLET DAILY  90 tablet  0  . omeprazole (PRILOSEC) 20 MG capsule Take 1 capsule (20 mg total) by mouth daily.  90 capsule  4  . Polyethyl Glycol-Propyl Glycol (SYSTANE)  0.4-0.3 % SOLN Place 1 drop into both eyes 2 (two) times daily.        [name] SPIRIVA HANDIHALER 18 MCG inhalation capsule INHALE 1 CAPSULE CONTENTS ONCE DAILY  30 capsule  5  . traMADol (ULTRAM) 50 MG tablet One half tablet daily if needed for severe pain  15 tablet  0  . TRAVATAN Z 0.004 % SOLN ophthalmic solution Place 1 drop into both eyes at bedtime.       [name] warfarin (COUMADIN) 5 MG tablet TAKE AS DIRECTED  45 tablet  2  . Cholecalciferol (VITAMIN D-3 PO) Take 2,000 Units by mouth daily.      . zoledronic acid (RECLAST) 5 MG/100ML SOLN Inject 5 mg into the vein once. Given 10-03-12; [facility]       No current facility-administered medications for this visit.   Facility-Administered Medications Ordered in Other Visits  Medication Dose Route Frequency Provider Last Rate Last Dose  . zoledronic acid (RECLAST) injection 5 mg  5 mg Intravenous Once Delbert Harness, MD        Past Medical History  Diagnosis Date  . Lung cancer 09-11-2007    Dr Edwyna Shell did surgery;  Dr. Gwenyth Bouillon at cancer ctr  . Allergic rhinitis   . Atrial fibrillation   . Hyperlipidemia   . Asthma     Past Surgical History  Procedure Laterality Date  . Inguinal hernia repair      right  . Thyroid surgery      nodule resection  . Lung lobectomy  09-11-2007    right upper lobe    ROS: Mild fatigue.  Otherwise as stated in the HPI and negative for all other systems..  PHYSICAL EXAM BP 122/75  Pulse 96  Ht 4\' 9"  (1.448 m)  Wt 131 lb (59.421 kg)  BMI 28.34 kg/m2 GENERAL: Frail appearing but in no distress HEENT:  Pupils equal round and reactive, fundi not visualized, oral mucosa unremarkable, poor dentition NECK:  No jugular venous distention, waveform within normal limits, carotid upstroke brisk and symmetric, no bruits, no thyromegaly LUNGS:  Clear to auscultation bilaterally HEART:  PMI not displaced or sustained,S1 and S2 within normal limits, no S3, no clicks, no rubs, 2/3 apical systolic murmur  radiating out the outflow tract, noo diastolic murmurs, irregular ABD:  Flat, positive bowel sounds normal in frequency in pitch, no bruits, no rebound, no guarding, no midline pulsatile mass, no hepatomegaly, no splenomegaly EXT:  2 plus pulses throughout, no edema, no cyanosis no clubbing  EKG:  Atrial fibrillation, rate 81, low voltage, RSR in V1 and V2, no acute ST-T wave changes. 06/12/2013   ASSESSMENT AND PLAN  ATRIAL FIBRILLATION -  Her rate is well controlled. I did discuss with her and her daughter the possibility of using a NOAC.  They are comfortable with warfarin and do not plan on changing.  Dyspnea -  As above. This is probably multifactorial given her lung disease and lung cancer previously. She seems to be euvolemic. Given the fact that this is stable no change in therapy or further evaluation is planned.

## 2013-06-21 ENCOUNTER — Other Ambulatory Visit: Payer: Self-pay | Admitting: Cardiology

## 2013-06-21 ENCOUNTER — Other Ambulatory Visit: Payer: Self-pay | Admitting: Family Medicine

## 2013-07-01 ENCOUNTER — Ambulatory Visit (INDEPENDENT_AMBULATORY_CARE_PROVIDER_SITE_OTHER): Payer: Medicare Other | Admitting: Internal Medicine

## 2013-07-01 ENCOUNTER — Encounter: Payer: Self-pay | Admitting: Internal Medicine

## 2013-07-01 VITALS — BP 120/76 | HR 92 | Ht 59.0 in | Wt 132.6 lb

## 2013-07-01 DIAGNOSIS — J449 Chronic obstructive pulmonary disease, unspecified: Secondary | ICD-10-CM

## 2013-07-01 DIAGNOSIS — I4891 Unspecified atrial fibrillation: Secondary | ICD-10-CM

## 2013-07-01 MED ORDER — ALBUTEROL SULFATE HFA 108 (90 BASE) MCG/ACT IN AERS: 2.0000 | INHALATION_SPRAY | Freq: Four times a day (QID) | RESPIRATORY_TRACT | Status: DC | PRN

## 2013-07-01 NOTE — Patient Instructions (Signed)
Refill script Proair rescue inhaler  I have enjoyed working wih you. As agreed, it will work well for you to be followed now by Dr Christell Constant. We will be happy to see you back if needed

## 2013-07-01 NOTE — Progress Notes (Signed)
Patient ID: Erica [name], female    DOB: 10/18/1920, 79 y.o.   MRN: [id number]  HPI 12/07/10- 79 yoF seen on referral by Dr Antoine Poche. Here with daughter. I last saw her in 2008 when needle bx of a RUL nodule -> nonsmall cell cancer. This was resected by Dr Edwyna Shell with RUL lobectomy and subsequently followed by Dr Shirline Frees without XRT or Chem. No recurrence so far.  She never smoked, but her husband did. Old record reviewed, without PFT found. Recently she has been treated by Dr Antoine Poche for AFib. The question is how much of her dyspnea is pulmonary and whether her lung disease contributes to her arrythmia. She describes some chronic exertional dyspnea, worse since around the time she began coumadin. Noticed walking briskly and when lying down, relieved by rest. SOB to mailbox and back- stopping to get her breath. Breathing meds also help some. Occasional dry cough, some wheeze, and a sensation of something moving in left lower chest or upper abdomen. Known large hiatal hernia but she denies reflux. She is on warfarin, but denies anemia. CT chest 10/04/10 reviewed- S/p RUL lobectomy and Rt thyroidectomy. No mets or nodes. NAD, Nl heart size.  01/24/11- ?COPD, Hx RULobect for NSCCA .  Daughter here Comes for f/u/ Mentions pain under left scapula x months, worse with prolonged standing, not progressive, controlled with tylenol. We referred to the CT she had 4 months ago. She denies cough or wheeze, admitting her DOE may be age- appropriate.  PFT- moderate reversible slowing in small airways , DLCO 69% FEV1/FVC 0.58; DLCO 69% . Good oxygenation 99%, 98%, 99% 268 meters  05/23/11- 79 yoF?COPD, Hx RULobect for NSCCA .... daughter here We reviewed her history as a secondhand smoker while married. She again reports pain under the left scapula and at the base of the neck which are no worse and are relieved by Tylenol. PFT had demonstrated moderate COPD and she had done well on 6 minute walk test. She  denies cough or phlegm. Uses her rescue inhaler before walking outdoors and continues Symbicort and Spiriva. Dr. Shirline Frees will get a followup CT scan of her chest February for f/u of her prior lung cancer.  12/22/11-  79 yoF Asthma/ COPD, Hx RULobect for NSCCA, complicated by AFib .... daughter here Has "cold" right now; cough-productive this morning and white in color; chest tightness, wheezing, SOB. Spring pollen did cause watery sniffing, but managed. She denies fever, sore throat or purulent sputum. Doesn't notice her wheeze. Continues meds as reviewed- needs rescue refill but not out.  Dr Shirline Frees tracks chest CT- reviewed.  CT chest 10/06/11- IMPRESSION:  1. Surgical changes in the right upper lobe, without locally  recurrent disease.  2. Similar borderline to minimally enlarged pretracheal node.  Stability argues for a benign etiology, but warrants followup  attention.  3. Moderate hiatal hernia.  4. Right liver lobe hypoattenuating lesion is not significantly  changed, but technically indeterminate.  Original Report Authenticated By: Consuello Bossier, M.D.   06/28/12- 79 yoF Asthma/ COPD, Hx RULobect for NSCCA, complicated by AFib .... daughter here FOLLOWS FOR: SOB-resting or active; denies any wheezing, slight cough; sneezing---? allergies. Minor cough blamed on "allergy", dry. Feet swelling and started on HCTZ. Dyspnea with little change.  12/26/12- 79 yoF Asthma/ COPD, Hx RULobect for NSCCA, complicated by AFib .... daughter here FOLLOWS FOR: continues to have SOB with activity and at rest; wheezing, very little cough and sneezing as well. No recurrence of lung cancer.  Chronic dyspnea. Does not have oxygen or pacemaker. Reviewed medication. CT chest 10/10/12 IMPRESSION:  Stable exam. No new or progressive disease in this patient status  post right lung surgery for lung cancer.  Moderate-to-large hiatal hernia.  Stable low density right liver lesion.  Original Report  Authenticated By: Kennith Center, M.D.  07/01/13- 78 yoF Asthma/ COPD, Hx RULobect for NSCCA, complicated by AFib .... daughter here FOLLOWS FOR: Pt states she continues to have SOB and wheezing with activity; she doesn't feel that it has gotten worse since last OV. Feels about the same. Easy DOE- not worse. Discussed pneumonia vaccine.  Review of Systems-See HPI Constitutional:   No-   weight loss, night sweats, fevers, chills, fatigue, lassitude. HEENT:   No-  headaches, difficulty swallowing, tooth/dental problems, sore throat,       No-sneezing,No- itching, ear ache, nasal congestion, post nasal drip,  CV:  No-   chest pain, orthopnea, PND, swelling in lower extremities, anasarca, dizziness, palpitations Resp: + shortness of breath with exertion, Not at rest.              No-   productive cough,  No non-productive cough,  No-  coughing up of blood.              No-   change in color of mucus.  No- wheezing.   Skin: No-   rash or lesions. GI:  No-   heartburn, indigestion, abdominal pain, nausea, vomiting,  GU:  MS:  No-   joint pain or swelling.   Neuro- nothing unusual  Psych:  No- change in mood or affect. No depression or anxiety.  No memory loss. Objective:   Physical Exam General- Alert, Oriented, Affect-appropriate, Distress- none acute Skin- rash-none, lesions- none, excoriation- none Lymphadenopathy- none Head- atraumatic            Eyes- Gross vision intact, PERRLA, conjunctivae clear secretions            Ears- Hearing, hard of hearing            Nose- Clear, no-Septal dev, mucus, polyps, erosion, perforation             Throat- Mallampati II , mucosa clear/ not red , drainage- none, tonsils- atrophic. Many worn/ missing teeth. Neck- flexible , trachea midline, no stridor , thyroid nl, carotid no bruit Chest - symmetrical excursion , unlabored           Heart/CV- IRR , 2-3 syst murmur , no gallop  , no rub, nl s1 s2                           - JVD- none , edema-  none-ankles do not pit, stasis changes- none, varices- none           Lung- clear , no-wheeze , unlabored, cough- none , dullness-none, rub- none.            Chest wall-  Abd-  Br/ Gen/ Rectal- Not done, not indicated Extrem- cyanosis- none, clubbing, none, atrophy- none, strength- nl Neuro- grossly intact to observation

## 2013-07-02 ENCOUNTER — Ambulatory Visit (INDEPENDENT_AMBULATORY_CARE_PROVIDER_SITE_OTHER): Payer: Medicare Other | Admitting: Pharmacist Clinician (PhC)/ Clinical Pharmacy Specialist

## 2013-07-02 DIAGNOSIS — I4891 Unspecified atrial fibrillation: Secondary | ICD-10-CM

## 2013-07-02 LAB — POCT INR: INR: 2.8

## 2013-07-17 NOTE — Assessment & Plan Note (Signed)
Rate controlled 

## 2013-07-17 NOTE — Assessment & Plan Note (Signed)
Controlled. Plan- discussed pneumonia vax, refill Proair. She is going to follow with Dr Christell Constant. I will be happy to see her again as needed.

## 2013-08-13 ENCOUNTER — Other Ambulatory Visit: Payer: Self-pay | Admitting: Family Medicine

## 2013-08-13 ENCOUNTER — Ambulatory Visit (INDEPENDENT_AMBULATORY_CARE_PROVIDER_SITE_OTHER): Payer: Medicare Other | Admitting: Pharmacist Clinician (PhC)/ Clinical Pharmacy Specialist

## 2013-08-13 DIAGNOSIS — I4891 Unspecified atrial fibrillation: Secondary | ICD-10-CM

## 2013-08-13 LAB — POCT INR: INR: 2.6

## 2013-09-04 ENCOUNTER — Encounter: Payer: Self-pay | Admitting: Family Medicine

## 2013-09-04 ENCOUNTER — Ambulatory Visit (INDEPENDENT_AMBULATORY_CARE_PROVIDER_SITE_OTHER): Payer: Medicare Other | Admitting: Family Medicine

## 2013-09-04 VITALS — BP 136/82 | HR 83 | Temp 96.9°F | Ht 59.0 in | Wt 131.0 lb

## 2013-09-04 DIAGNOSIS — E039 Hypothyroidism, unspecified: Secondary | ICD-10-CM

## 2013-09-04 DIAGNOSIS — M47815 Spondylosis without myelopathy or radiculopathy, thoracolumbar region: Secondary | ICD-10-CM

## 2013-09-04 DIAGNOSIS — J449 Chronic obstructive pulmonary disease, unspecified: Secondary | ICD-10-CM

## 2013-09-04 DIAGNOSIS — I4891 Unspecified atrial fibrillation: Secondary | ICD-10-CM

## 2013-09-04 DIAGNOSIS — M479 Spondylosis, unspecified: Secondary | ICD-10-CM

## 2013-09-04 DIAGNOSIS — C3410 Malignant neoplasm of upper lobe, unspecified bronchus or lung: Secondary | ICD-10-CM

## 2013-09-04 DIAGNOSIS — Z0000 Encounter for general adult medical examination without abnormal findings: Secondary | ICD-10-CM

## 2013-09-04 DIAGNOSIS — Z23 Encounter for immunization: Secondary | ICD-10-CM

## 2013-09-04 DIAGNOSIS — E559 Vitamin D deficiency, unspecified: Secondary | ICD-10-CM

## 2013-09-04 LAB — POCT CBC
GRANULOCYTE PERCENT: 58.8 % (ref 37–80)
HCT, POC: 42.6 % (ref 37.7–47.9)
HEMOGLOBIN: 13.7 g/dL (ref 12.2–16.2)
Lymph, poc: 1.7 (ref 0.6–3.4)
MCH, POC: 29.7 pg (ref 27–31.2)
MCHC: 32.3 g/dL (ref 31.8–35.4)
MCV: 92.1 fL (ref 80–97)
MPV: 7 fL (ref 0–99.8)
POC GRANULOCYTE: 2.9 (ref 2–6.9)
POC LYMPH %: 34.4 % (ref 10–50)
Platelet Count, POC: 166 10*3/uL (ref 142–424)
RBC: 4.6 M/uL (ref 4.04–5.48)
RDW, POC: 14.5 %
WBC: 4.9 10*3/uL (ref 4.6–10.2)

## 2013-09-04 MED ORDER — TRAMADOL HCL 50 MG PO TABS: ORAL_TABLET | ORAL | Status: DC

## 2013-09-04 NOTE — Progress Notes (Signed)
Subjective:    Patient ID: Erica [name], female    DOB: 14-Jan-1921, 80 y.o.   MRN: [id number]  HPI Pt here for follow up and management of chronic medical problems. Patient comes to the visit today with her daughter. She does complain of just general joint aches and pains and some shortness of breath. She will be given an FOBT today, a Prevnar vaccine today and will have her lab work drawn today. She is scheduled for a CT scan in March for followup of the lung cancer. The time being the patient no longer sees Dr. Annamaria Boots, Dr. Arlyce Dice, or Dr. Rogue Jury. She still sees the cardiologist yearly in November.         Patient Active Problem List   Diagnosis Date Noted  . Hypothyroidism 12/17/2012  . Asthma with COPD 01/24/2011  . Dyspnea 12/11/2010  . ATRIAL FIBRILLATION 02/05/2010  . IRON DEFICIENCY ANEMIA SECONDARY TO BLOOD LOSS 07/31/2007  . DIVERTICULOSIS OF COLON 07/31/2007  . IDIOPATHIC OSTEOPOROSIS 07/31/2007  . ALLERGIC RHINITIS 07/28/2007  . HIATAL HERNIA 07/28/2007  . LUNG CANCER, UPPER LOBE 07/24/2007   Outpatient Encounter Prescriptions as of 09/04/2013  Medication Sig  . acetaminophen (TYLENOL) 500 MG tablet Take 500 mg by mouth every 6 (six) hours as needed.   [name] albuterol (PROAIR HFA) 108 (90 BASE) MCG/ACT inhaler Inhale 2 puffs into the lungs every 6 (six) hours as needed for wheezing or shortness of breath.  [name] atorvastatin (LIPITOR) 10 MG tablet TAKE 1 TABLET DAILY  . budesonide-formoterol (SYMBICORT) 160-4.5 MCG/ACT inhaler 2 puffs twice daily, rinse mouth after using  . Calcium Carbonate-Vitamin D (CALTRATE 600+D) 600-400 MG-UNIT per tablet Take 1 tablet by mouth 2 (two) times daily.    . Cholecalciferol (VITAMIN D-3 PO) Take 2,000 Units by mouth daily.  . Ferrous Gluconate (IRON) 240 (27 FE) MG TABS Take 1 tablet by mouth daily.    . fish oil-omega-3 fatty acids 1000 MG capsule Take 2 g by mouth daily.    . folic acid (FOLVITE) 917 MCG tablet Take 400 mcg by mouth daily.     [name] levothyroxine (SYNTHROID, LEVOTHROID) 50 MCG tablet Take 50 mcg by mouth daily.    . metoprolol succinate (TOPROL-XL) 100 MG 24 hr tablet TAKE 1 TABLET DAILY  . omeprazole (PRILOSEC) 20 MG capsule Take 1 capsule (20 mg total) by mouth daily.  Vladimir Faster Glycol-Propyl Glycol (SYSTANE) 0.4-0.3 % SOLN Place 1 drop into both eyes 2 (two) times daily.    [name] SPIRIVA HANDIHALER 18 MCG inhalation capsule INHALE 1 CAPSULE CONTENTS ONCE DAILY  . traMADol (ULTRAM) 50 MG tablet One half tablet daily if needed for severe pain  . TRAVATAN Z 0.004 % SOLN ophthalmic solution Place 1 drop into both eyes at bedtime.   [name] warfarin (COUMADIN) 5 MG tablet TAKE AS DIRECTED.  [name] zoledronic acid (RECLAST) 5 MG/100ML SOLN Inject 5 mg into the vein once. Given 10-03-12; gets yearly at [facility]  . [DISCONTINUED] dextromethorphan-guaiFENesin [facility] DM) 30-600 MG per 12 hr tablet Take 1 tablet by mouth every 12 (twelve) hours as needed.     Review of Systems  Constitutional: Negative.   HENT: Negative.   Eyes: Negative.   Respiratory: Positive for shortness of breath.   Cardiovascular: Negative.   Gastrointestinal: Negative.   Endocrine: Negative.   Genitourinary: Negative.   Musculoskeletal: Positive for arthralgias (shoulder pain).  Skin: Negative.   Allergic/Immunologic: Negative.   Neurological: Negative.   Hematological: Negative.   Psychiatric/Behavioral: Negative.  Objective:   Physical Exam  Nursing note and vitals reviewed. Constitutional: She is oriented to person, place, and time. She appears well-developed and well-nourished. No distress.  For her age  HENT:  Head: Normocephalic and atraumatic.  Left Ear: External ear normal.  Nose: Nose normal.  Mouth/Throat: Oropharynx is clear and moist.  Edentulous ,Bilateral ear cerumen some was removed from the right EAC  Eyes: Conjunctivae and EOM are normal. Pupils are equal, round, and reactive to light. Right eye exhibits no  discharge. Left eye exhibits no discharge. No scleral icterus.  Neck: Normal range of motion. Neck supple. No JVD present. No thyromegaly present.  No bruits  Cardiovascular: Normal rate, normal heart sounds and intact distal pulses.  Exam reveals no gallop and no friction rub.   No murmur heard. Irregular irregular at 72 per minute  Pulmonary/Chest: Effort normal and breath sounds normal. No respiratory distress. She has no wheezes. She has no rales. She exhibits no tenderness.  Good breath sounds bilaterally  Abdominal: Soft. Bowel sounds are normal. She exhibits no mass. There is tenderness. There is no rebound and no guarding.  Slight tenderness left lower quadrant  Musculoskeletal: Normal range of motion. She exhibits no edema and no tenderness.  A lot of varicosities on both feet and ankles  Lymphadenopathy:    She has no cervical adenopathy.  Neurological: She is alert and oriented to person, place, and time. She has normal reflexes. No cranial nerve deficit.  Skin: Skin is warm and dry. No rash noted.  Psychiatric: She has a normal mood and affect. Her behavior is normal. Judgment and thought content normal.   BP 136/82  Pulse 83  Temp(Src) 96.9 F (36.1 C) (Oral)  Ht 4' 11"  (1.499 m)  Wt 131 lb (59.421 kg)  BMI 26.44 kg/m2        Assessment & Plan:  1. Atrial fibrillation - POCT CBC - BMP8+EGFR - Hepatic function panel - NMR, lipoprofile  2. Hypothyroidism - POCT CBC - Thyroid Panel With TSH  3. Asthma with COPD - POCT CBC  4. Healthcare maintenance - NMR, lipoprofile - Vit D  25 hydroxy (rtn osteoporosis monitoring) - Thyroid Panel With TSH  5. Vitamin D deficiency - Vit D  25 hydroxy (rtn osteoporosis monitoring)  6. Osteoarthritis of back - traMADol (ULTRAM) 50 MG tablet; One half tablet daily if needed for severe pain  Dispense: 15 tablet; Refill: 0  7. LUNG CANCER, UPPER LOBE  Patient Instructions  Continue current medications. Continue good  therapeutic lifestyle changes which include good diet and exercise. Fall precautions discussed with patient. Schedule your flu vaccine if you haven't had it yet If you are over 42 years old - you may need Prevnar 1 or the adult Pneumonia vaccine. Try to drink plenty of water Stay as active as possible The Prevnar vaccine that you received to date may make her arm sore. Call us within 3-4 days after getting her CT scan in March   Arrie Senate MD

## 2013-09-04 NOTE — Addendum Note (Signed)
Addended by: Zannie Cove on: 09/04/2013 10:50 AM   Modules accepted: Orders

## 2013-09-04 NOTE — Patient Instructions (Addendum)
Continue current medications. Continue good therapeutic lifestyle changes which include good diet and exercise. Fall precautions discussed with patient. Schedule your flu vaccine if you haven't had it yet If you are over 78 years old - you may need Prevnar 74 or the adult Pneumonia vaccine. Try to drink plenty of water Stay as active as possible The Prevnar vaccine that you received to date may make her arm sore. Call us within 3-4 days after getting her CT scan in March

## 2013-09-06 LAB — NMR, LIPOPROFILE
Cholesterol: 174 mg/dL (ref <200)
HDL CHOLESTEROL BY NMR: 70 mg/dL (ref >=40)
HDL Particle Number: 31.7 umol/L (ref >=30.5)
LDL Particle Number: 829 nmol/L (ref <1000)
LDL Size: 21 nm (ref >20.5)
LDLC SERPL CALC-MCNC: 89 mg/dL (ref <100)
Triglycerides by NMR: 76 mg/dL (ref <150)

## 2013-09-06 LAB — HEPATIC FUNCTION PANEL
ALBUMIN: 4.4 g/dL (ref 3.2–4.6)
ALT: 14 IU/L (ref 0–32)
AST: 17 IU/L (ref 0–40)
Alkaline Phosphatase: 72 IU/L (ref 39–117)
BILIRUBIN TOTAL: 0.9 mg/dL (ref 0.0–1.2)
Bilirubin, Direct: 0.3 mg/dL (ref 0.00–0.40)
TOTAL PROTEIN: 6.5 g/dL (ref 6.0–8.5)

## 2013-09-06 LAB — BMP8+EGFR
BUN/Creatinine Ratio: 16 (ref 11–26)
BUN: 10 mg/dL (ref 10–36)
CALCIUM: 8.9 mg/dL (ref 8.7–10.3)
CO2: 23 mmol/L (ref 18–29)
Chloride: 103 mmol/L (ref 97–108)
Creatinine, Ser: 0.63 mg/dL (ref 0.57–1.00)
GFR calc Af Amer: 91 mL/min/{1.73_m2} (ref >59)
GFR, EST NON AFRICAN AMERICAN: 79 mL/min/{1.73_m2} (ref >59)
GLUCOSE: 94 mg/dL (ref 65–99)
POTASSIUM: 4.2 mmol/L (ref 3.5–5.2)
Sodium: 143 mmol/L (ref 134–144)

## 2013-09-06 LAB — THYROID PANEL WITH TSH
FREE THYROXINE INDEX: 3.1 (ref 1.2–4.9)
T3 Uptake Ratio: 30 % (ref 24–39)
T4, Total: 10.4 ug/dL (ref 4.5–12.0)
TSH: 1.84 u[IU]/mL (ref 0.450–4.500)

## 2013-09-06 LAB — VITAMIN D 25 HYDROXY (VIT D DEFICIENCY, FRACTURES): VIT D 25 HYDROXY: 31.3 ng/mL (ref 30.0–100.0)

## 2013-09-11 ENCOUNTER — Encounter: Payer: Self-pay | Admitting: *Deleted

## 2013-09-11 ENCOUNTER — Other Ambulatory Visit: Payer: Self-pay | Admitting: Family Medicine

## 2013-09-16 ENCOUNTER — Other Ambulatory Visit: Payer: Self-pay

## 2013-09-18 ENCOUNTER — Other Ambulatory Visit: Payer: Self-pay | Admitting: Family Medicine

## 2013-09-23 ENCOUNTER — Telehealth: Payer: Self-pay | Admitting: Pharmacist

## 2013-09-23 NOTE — Telephone Encounter (Signed)
Patient is due yearly reclast around 10/03/2013. She has had labs needed 09/05/2013.   Order for Reclast was faxed to [facility] and patient's daughter notified to call in 1-2 days to set up appt for infusion of Reclast.

## 2013-09-25 ENCOUNTER — Encounter: Payer: Self-pay | Admitting: *Deleted

## 2013-09-30 ENCOUNTER — Other Ambulatory Visit (HOSPITAL_COMMUNITY): Payer: Self-pay | Admitting: *Deleted

## 2013-10-01 ENCOUNTER — Encounter (HOSPITAL_COMMUNITY)
Admission: RE | Admit: 2013-10-01 | Discharge: 2013-10-01 | Disposition: A | Discharge status: Home / Self Care | Payer: Medicare Other | Source: Ambulatory Visit | Attending: Family Medicine | Admitting: Family Medicine

## 2013-10-01 DIAGNOSIS — M810 Age-related osteoporosis without current pathological fracture: Secondary | ICD-10-CM | POA: Insufficient documentation

## 2013-10-01 MED ORDER — ZOLEDRONIC ACID 5 MG/100ML IV SOLN
INTRAVENOUS | Status: AC
  Filled 2013-10-01: qty 100

## 2013-10-01 MED ORDER — ZOLEDRONIC ACID 5 MG/100ML IV SOLN
5.0000 mg | Freq: Once | INTRAVENOUS | Status: AC
  Administered 2013-10-01: 5 mg via INTRAVENOUS

## 2013-10-03 LAB — FECAL OCCULT BLOOD, IMMUNOCHEMICAL: Fecal Occult Bld: POSITIVE — AB

## 2013-10-09 ENCOUNTER — Ambulatory Visit (INDEPENDENT_AMBULATORY_CARE_PROVIDER_SITE_OTHER): Payer: Medicare Other | Admitting: Pharmacist

## 2013-10-09 DIAGNOSIS — I4891 Unspecified atrial fibrillation: Secondary | ICD-10-CM

## 2013-10-09 LAB — POCT INR: INR: 2.2

## 2013-10-09 NOTE — Patient Instructions (Signed)
Anticoagulation Dose Instructions as of 10/09/2013     Erica [name] Tue Wed Thu Fri Sat   New Dose 5 mg 5 mg 5 mg 5 mg 5 mg 5 mg 5 mg    Description       Continue current dose  of 1 tablet (= 5 mg ) daily     INR was 2.2 today

## 2013-10-15 ENCOUNTER — Other Ambulatory Visit: Payer: Self-pay

## 2013-10-15 DIAGNOSIS — C3490 Malignant neoplasm of unspecified part of unspecified bronchus or lung: Secondary | ICD-10-CM

## 2013-10-21 ENCOUNTER — Ambulatory Visit (HOSPITAL_COMMUNITY)
Admission: RE | Admit: 2013-10-21 | Discharge: 2013-10-21 | Disposition: A | Discharge status: Home / Self Care | Payer: Medicare Other | Source: Ambulatory Visit | Attending: Family Medicine | Admitting: Family Medicine

## 2013-10-21 ENCOUNTER — Encounter (HOSPITAL_COMMUNITY): Payer: Self-pay

## 2013-10-21 DIAGNOSIS — K449 Diaphragmatic hernia without obstruction or gangrene: Secondary | ICD-10-CM | POA: Insufficient documentation

## 2013-10-21 DIAGNOSIS — R059 Cough, unspecified: Secondary | ICD-10-CM | POA: Insufficient documentation

## 2013-10-21 DIAGNOSIS — I7090 Unspecified atherosclerosis: Secondary | ICD-10-CM | POA: Insufficient documentation

## 2013-10-21 DIAGNOSIS — R05 Cough: Secondary | ICD-10-CM | POA: Insufficient documentation

## 2013-10-21 DIAGNOSIS — Z85118 Personal history of other malignant neoplasm of bronchus and lung: Secondary | ICD-10-CM

## 2013-10-21 DIAGNOSIS — C3490 Malignant neoplasm of unspecified part of unspecified bronchus or lung: Secondary | ICD-10-CM | POA: Insufficient documentation

## 2013-10-21 DIAGNOSIS — R0602 Shortness of breath: Secondary | ICD-10-CM | POA: Insufficient documentation

## 2013-10-21 DIAGNOSIS — Z902 Acquired absence of lung [part of]: Secondary | ICD-10-CM | POA: Insufficient documentation

## 2013-11-14 ENCOUNTER — Other Ambulatory Visit: Payer: Self-pay | Admitting: Family Medicine

## 2013-11-21 ENCOUNTER — Ambulatory Visit (INDEPENDENT_AMBULATORY_CARE_PROVIDER_SITE_OTHER): Payer: Medicare Other | Admitting: Pharmacist

## 2013-11-21 DIAGNOSIS — I4891 Unspecified atrial fibrillation: Secondary | ICD-10-CM

## 2013-11-21 LAB — POCT INR: INR: 2.7

## 2013-11-21 NOTE — Patient Instructions (Signed)
Anticoagulation Dose Instructions as of 11/21/2013     Erica [name] Tue Wed Thu Fri Sat   New Dose 5 mg 5 mg 5 mg 5 mg 5 mg 5 mg 5 mg    Description       Continue current dose  of 1 tablet (= 5 mg ) daily      INR was 2.7 today

## 2013-12-10 ENCOUNTER — Encounter: Payer: Self-pay | Admitting: Nurse Practitioner

## 2013-12-10 ENCOUNTER — Ambulatory Visit (INDEPENDENT_AMBULATORY_CARE_PROVIDER_SITE_OTHER): Payer: Medicare Other | Admitting: Nurse Practitioner

## 2013-12-10 VITALS — BP 124/71 | HR 81 | Temp 97.2°F | Ht 59.0 in | Wt 133.6 lb

## 2013-12-10 DIAGNOSIS — Z01419 Encounter for gynecological examination (general) (routine) without abnormal findings: Secondary | ICD-10-CM

## 2013-12-10 NOTE — Progress Notes (Signed)
   Subjective:    Patient ID: Erica [name], female    DOB: March 13, 1921, 80 y.o.   MRN: [id number]  HPI  Regular patient of dr. Laurance Flatten that is here today for Pelvic only- no complaints today- had follow up with Dr. Laurance Flatten 2 months ago    Review of Systems  Constitutional: Negative.   HENT: Negative.   Respiratory: Positive for shortness of breath (normal for her).   Cardiovascular: Negative.   Gastrointestinal: Negative.   Genitourinary: Negative.   Psychiatric/Behavioral: Negative.   All other systems reviewed and are negative.      Objective:   Physical Exam  Constitutional: She is oriented to person, place, and time. She appears well-developed and well-nourished.  Cardiovascular: Normal rate.   Murmur (2/6 systolic murmur) heard. Regular irregularity  Pulmonary/Chest: Effort normal. She has wheezes (exp throughout).  Genitourinary:  No adnexal masses or tenderness  Neurological: She is alert and oriented to person, place, and time.  Skin: Skin is warm.  Psychiatric: She has a normal mood and affect. Her behavior is normal. Judgment and thought content normal.   BP 124/71  Pulse 81  Temp(Src) 97.2 F (36.2 C) (Oral)  Ht 4\' 11"  (1.499 m)  Wt 133 lb 9.6 oz (60.601 kg)  BMI 26.97 kg/m2         Assessment & Plan:   1. Encounter for routine gynecological examination    Keep follow up with Dr. Mayra Neer, FNP

## 2013-12-20 ENCOUNTER — Other Ambulatory Visit: Payer: Self-pay | Admitting: Family Medicine

## 2013-12-23 NOTE — Telephone Encounter (Signed)
Tramadol was last filled 09/01/13, Rx will print

## 2014-01-05 ENCOUNTER — Emergency Department (HOSPITAL_COMMUNITY): Payer: Medicare Other

## 2014-01-05 ENCOUNTER — Encounter (HOSPITAL_COMMUNITY): Payer: Self-pay | Admitting: Emergency Medicine

## 2014-01-05 ENCOUNTER — Inpatient Hospital Stay (HOSPITAL_COMMUNITY)
Admission: EM | Admit: 2014-01-05 | Discharge: 2014-01-13 | DRG: 308 | Disposition: A | Discharge status: Skilled Nursing Facility | Payer: Medicare Other | Attending: Internal Medicine | Admitting: Internal Medicine

## 2014-01-05 DIAGNOSIS — R059 Cough, unspecified: Secondary | ICD-10-CM

## 2014-01-05 DIAGNOSIS — W010XXA Fall on same level from slipping, tripping and stumbling without subsequent striking against object, initial encounter: Secondary | ICD-10-CM | POA: Diagnosis present

## 2014-01-05 DIAGNOSIS — S060X0A Concussion without loss of consciousness, initial encounter: Secondary | ICD-10-CM | POA: Diagnosis present

## 2014-01-05 DIAGNOSIS — K5730 Diverticulosis of large intestine without perforation or abscess without bleeding: Secondary | ICD-10-CM

## 2014-01-05 DIAGNOSIS — R05 Cough: Secondary | ICD-10-CM | POA: Diagnosis present

## 2014-01-05 DIAGNOSIS — E860 Dehydration: Secondary | ICD-10-CM | POA: Diagnosis present

## 2014-01-05 DIAGNOSIS — H1130 Conjunctival hemorrhage, unspecified eye: Secondary | ICD-10-CM | POA: Diagnosis present

## 2014-01-05 DIAGNOSIS — W108XXA Fall (on) (from) other stairs and steps, initial encounter: Secondary | ICD-10-CM | POA: Diagnosis present

## 2014-01-05 DIAGNOSIS — J309 Allergic rhinitis, unspecified: Secondary | ICD-10-CM

## 2014-01-05 DIAGNOSIS — E785 Hyperlipidemia, unspecified: Secondary | ICD-10-CM | POA: Diagnosis present

## 2014-01-05 DIAGNOSIS — S0003XA Contusion of scalp, initial encounter: Secondary | ICD-10-CM | POA: Diagnosis present

## 2014-01-05 DIAGNOSIS — Z602 Problems related to living alone: Secondary | ICD-10-CM

## 2014-01-05 DIAGNOSIS — S0083XA Contusion of other part of head, initial encounter: Secondary | ICD-10-CM | POA: Diagnosis present

## 2014-01-05 DIAGNOSIS — D500 Iron deficiency anemia secondary to blood loss (chronic): Secondary | ICD-10-CM

## 2014-01-05 DIAGNOSIS — W19XXXA Unspecified fall, initial encounter: Secondary | ICD-10-CM

## 2014-01-05 DIAGNOSIS — C3410 Malignant neoplasm of upper lobe, unspecified bronchus or lung: Secondary | ICD-10-CM

## 2014-01-05 DIAGNOSIS — S1093XA Contusion of unspecified part of neck, initial encounter: Secondary | ICD-10-CM

## 2014-01-05 DIAGNOSIS — Y92009 Unspecified place in unspecified non-institutional (private) residence as the place of occurrence of the external cause: Secondary | ICD-10-CM

## 2014-01-05 DIAGNOSIS — G9341 Metabolic encephalopathy: Secondary | ICD-10-CM | POA: Diagnosis present

## 2014-01-05 DIAGNOSIS — B370 Candidal stomatitis: Secondary | ICD-10-CM | POA: Diagnosis not present

## 2014-01-05 DIAGNOSIS — Z902 Acquired absence of lung [part of]: Secondary | ICD-10-CM

## 2014-01-05 DIAGNOSIS — E876 Hypokalemia: Secondary | ICD-10-CM

## 2014-01-05 DIAGNOSIS — E039 Hypothyroidism, unspecified: Secondary | ICD-10-CM | POA: Diagnosis present

## 2014-01-05 DIAGNOSIS — R509 Fever, unspecified: Secondary | ICD-10-CM | POA: Diagnosis present

## 2014-01-05 DIAGNOSIS — Z79899 Other long term (current) drug therapy: Secondary | ICD-10-CM

## 2014-01-05 DIAGNOSIS — R1319 Other dysphagia: Secondary | ICD-10-CM | POA: Diagnosis not present

## 2014-01-05 DIAGNOSIS — I6350 Cerebral infarction due to unspecified occlusion or stenosis of unspecified cerebral artery: Secondary | ICD-10-CM | POA: Diagnosis not present

## 2014-01-05 DIAGNOSIS — J449 Chronic obstructive pulmonary disease, unspecified: Secondary | ICD-10-CM

## 2014-01-05 DIAGNOSIS — M818 Other osteoporosis without current pathological fracture: Secondary | ICD-10-CM

## 2014-01-05 DIAGNOSIS — I10 Essential (primary) hypertension: Secondary | ICD-10-CM | POA: Diagnosis present

## 2014-01-05 DIAGNOSIS — R1310 Dysphagia, unspecified: Secondary | ICD-10-CM

## 2014-01-05 DIAGNOSIS — J45901 Unspecified asthma with (acute) exacerbation: Secondary | ICD-10-CM

## 2014-01-05 DIAGNOSIS — Z66 Do not resuscitate: Secondary | ICD-10-CM | POA: Diagnosis present

## 2014-01-05 DIAGNOSIS — IMO0002 Reserved for concepts with insufficient information to code with codable children: Secondary | ICD-10-CM

## 2014-01-05 DIAGNOSIS — Z85118 Personal history of other malignant neoplasm of bronchus and lung: Secondary | ICD-10-CM

## 2014-01-05 DIAGNOSIS — J189 Pneumonia, unspecified organism: Secondary | ICD-10-CM | POA: Diagnosis present

## 2014-01-05 DIAGNOSIS — S7010XA Contusion of unspecified thigh, initial encounter: Secondary | ICD-10-CM | POA: Diagnosis present

## 2014-01-05 DIAGNOSIS — R4182 Altered mental status, unspecified: Secondary | ICD-10-CM

## 2014-01-05 DIAGNOSIS — R0600 Dyspnea, unspecified: Secondary | ICD-10-CM

## 2014-01-05 DIAGNOSIS — D696 Thrombocytopenia, unspecified: Secondary | ICD-10-CM | POA: Diagnosis present

## 2014-01-05 DIAGNOSIS — J441 Chronic obstructive pulmonary disease with (acute) exacerbation: Secondary | ICD-10-CM | POA: Diagnosis present

## 2014-01-05 DIAGNOSIS — S7011XA Contusion of right thigh, initial encounter: Secondary | ICD-10-CM | POA: Diagnosis present

## 2014-01-05 DIAGNOSIS — I4891 Unspecified atrial fibrillation: Principal | ICD-10-CM | POA: Diagnosis present

## 2014-01-05 DIAGNOSIS — K449 Diaphragmatic hernia without obstruction or gangrene: Secondary | ICD-10-CM

## 2014-01-05 HISTORY — DX: Cardiac arrhythmia, unspecified: I49.9

## 2014-01-05 LAB — CBC
HEMATOCRIT: 39.3 % (ref 36.0–46.0)
Hemoglobin: 12.9 g/dL (ref 12.0–15.0)
MCH: 30.1 pg (ref 26.0–34.0)
MCHC: 32.8 g/dL (ref 30.0–36.0)
MCV: 91.6 fL (ref 78.0–100.0)
Platelets: 65 10*3/uL — ABNORMAL LOW (ref 150–400)
RBC: 4.29 MIL/uL (ref 3.87–5.11)
RDW: 13.9 % (ref 11.5–15.5)
WBC: 3.6 10*3/uL — AB (ref 4.0–10.5)

## 2014-01-05 LAB — BASIC METABOLIC PANEL
BUN: 21 mg/dL (ref 6–23)
CHLORIDE: 100 meq/L (ref 96–112)
CO2: 27 meq/L (ref 19–32)
CREATININE: 0.41 mg/dL — AB (ref 0.50–1.10)
Calcium: 8.5 mg/dL (ref 8.4–10.5)
GFR calc non Af Amer: 88 mL/min — ABNORMAL LOW (ref >90)
Glucose, Bld: 122 mg/dL — ABNORMAL HIGH (ref 70–99)
POTASSIUM: 3.3 meq/L — AB (ref 3.7–5.3)
Sodium: 141 mEq/L (ref 137–147)

## 2014-01-05 LAB — PROTIME-INR
INR: 3.81 — ABNORMAL HIGH (ref 0.00–1.49)
Prothrombin Time: 36.1 seconds — ABNORMAL HIGH (ref 11.6–15.2)

## 2014-01-05 LAB — TROPONIN I: Troponin I: <0.3 ng/mL (ref <0.30)

## 2014-01-05 LAB — PRO B NATRIURETIC PEPTIDE: Pro B Natriuretic peptide (BNP): 4533 pg/mL — ABNORMAL HIGH (ref 0–450)

## 2014-01-05 MED ORDER — FUROSEMIDE 10 MG/ML IJ SOLN
40.0000 mg | Freq: Once | INTRAMUSCULAR | Status: AC
  Administered 2014-01-05: 40 mg via INTRAVENOUS
  Filled 2014-01-05: qty 4

## 2014-01-05 MED ORDER — PANTOPRAZOLE SODIUM 40 MG PO TBEC
40.0000 mg | DELAYED_RELEASE_TABLET | Freq: Every day | ORAL | Status: DC
  Administered 2014-01-06 – 2014-01-13 (×6): 40 mg via ORAL
  Filled 2014-01-05 (×5): qty 1

## 2014-01-05 MED ORDER — DILTIAZEM HCL 100 MG IV SOLR
5.0000 mg/h | INTRAVENOUS | Status: DC
  Administered 2014-01-05 – 2014-01-06 (×2): 5 mg/h via INTRAVENOUS
  Administered 2014-01-06: 15 mg/h via INTRAVENOUS
  Administered 2014-01-07: 5 mg/h via INTRAVENOUS
  Filled 2014-01-05 (×4): qty 100

## 2014-01-05 MED ORDER — ONDANSETRON HCL 4 MG PO TABS: 4.0000 mg | ORAL_TABLET | Freq: Four times a day (QID) | ORAL | Status: DC | PRN

## 2014-01-05 MED ORDER — ACETAMINOPHEN 650 MG RE SUPP
650.0000 mg | Freq: Four times a day (QID) | RECTAL | Status: DC | PRN
  Administered 2014-01-05: 650 mg via RECTAL
  Filled 2014-01-05 (×2): qty 1

## 2014-01-05 MED ORDER — CALCIUM CARBONATE-VITAMIN D 600-400 MG-UNIT PO TABS: 1.0000 | ORAL_TABLET | Freq: Two times a day (BID) | ORAL | Status: DC

## 2014-01-05 MED ORDER — HYDROCODONE-ACETAMINOPHEN 5-325 MG PO TABS
1.0000 | ORAL_TABLET | ORAL | Status: DC | PRN
Start: 2014-01-05 — End: 2014-01-07
  Administered 2014-01-07: 2 via ORAL
  Filled 2014-01-05: qty 2

## 2014-01-05 MED ORDER — FOLIC ACID 1 MG PO TABS
1.0000 mg | ORAL_TABLET | Freq: Every day | ORAL | Status: DC
  Administered 2014-01-06 – 2014-01-13 (×7): 1 mg via ORAL
  Filled 2014-01-05 (×10): qty 1

## 2014-01-05 MED ORDER — ACETAMINOPHEN 500 MG PO TABS: 500.0000 mg | ORAL_TABLET | Freq: Four times a day (QID) | ORAL | Status: DC | PRN

## 2014-01-05 MED ORDER — POLYETHYL GLYCOL-PROPYL GLYCOL 0.4-0.3 % OP SOLN: 1.0000 [drp] | Freq: Two times a day (BID) | OPHTHALMIC | Status: DC

## 2014-01-05 MED ORDER — FOLIC ACID 400 MCG PO TABS: 400.0000 ug | ORAL_TABLET | Freq: Every day | ORAL | Status: DC

## 2014-01-05 MED ORDER — LEVOTHYROXINE SODIUM 75 MCG PO TABS
75.0000 ug | ORAL_TABLET | Freq: Every day | ORAL | Status: DC
  Administered 2014-01-06 – 2014-01-13 (×8): 75 ug via ORAL
  Filled 2014-01-05 (×9): qty 1

## 2014-01-05 MED ORDER — IPRATROPIUM-ALBUTEROL 0.5-2.5 (3) MG/3ML IN SOLN: 3.0000 mL | RESPIRATORY_TRACT | Status: DC | PRN

## 2014-01-05 MED ORDER — METOPROLOL SUCCINATE ER 100 MG PO TB24
100.0000 mg | ORAL_TABLET | Freq: Every day | ORAL | Status: DC
  Filled 2014-01-05 (×2): qty 1

## 2014-01-05 MED ORDER — IPRATROPIUM-ALBUTEROL 0.5-2.5 (3) MG/3ML IN SOLN
3.0000 mL | RESPIRATORY_TRACT | Status: DC
  Administered 2014-01-05 – 2014-01-06 (×7): 3 mL via RESPIRATORY_TRACT
  Filled 2014-01-05 (×6): qty 3

## 2014-01-05 MED ORDER — VITAMIN D3 25 MCG (1000 UNIT) PO TABS
2000.0000 [IU] | ORAL_TABLET | Freq: Every day | ORAL | Status: DC
  Administered 2014-01-05 – 2014-01-06 (×2): 2000 [IU] via ORAL
  Filled 2014-01-05 (×2): qty 2

## 2014-01-05 MED ORDER — DILTIAZEM HCL 25 MG/5ML IV SOLN
10.0000 mg | Freq: Once | INTRAVENOUS | Status: AC
  Administered 2014-01-05: 10 mg via INTRAVENOUS
  Filled 2014-01-05: qty 5

## 2014-01-05 MED ORDER — POTASSIUM CHLORIDE CRYS ER 20 MEQ PO TBCR: 20.0000 meq | EXTENDED_RELEASE_TABLET | Freq: Once | ORAL | Status: DC

## 2014-01-05 MED ORDER — OMEGA-3-ACID ETHYL ESTERS 1 G PO CAPS
2.0000 g | ORAL_CAPSULE | Freq: Every day | ORAL | Status: DC
  Administered 2014-01-05 – 2014-01-13 (×3): 2 g via ORAL
  Filled 2014-01-05 (×9): qty 2

## 2014-01-05 MED ORDER — DILTIAZEM HCL 25 MG/5ML IV SOLN
10.0000 mg | Freq: Once | INTRAVENOUS | Status: AC
  Administered 2014-01-05: 10 mg via INTRAVENOUS

## 2014-01-05 MED ORDER — LATANOPROST 0.005 % OP SOLN
1.0000 [drp] | Freq: Every day | OPHTHALMIC | Status: DC
  Administered 2014-01-06 – 2014-01-12 (×7): 1 [drp] via OPHTHALMIC
  Filled 2014-01-05: qty 2.5

## 2014-01-05 MED ORDER — BUDESONIDE-FORMOTEROL FUMARATE 160-4.5 MCG/ACT IN AERO
2.0000 | INHALATION_SPRAY | Freq: Two times a day (BID) | RESPIRATORY_TRACT | Status: DC
  Administered 2014-01-06 – 2014-01-12 (×13): 2 via RESPIRATORY_TRACT
  Filled 2014-01-05 (×2): qty 6

## 2014-01-05 MED ORDER — ONDANSETRON HCL 4 MG/2ML IJ SOLN: 4.0000 mg | Freq: Four times a day (QID) | INTRAMUSCULAR | Status: DC | PRN

## 2014-01-05 MED ORDER — ACETAMINOPHEN 325 MG PO TABS
650.0000 mg | ORAL_TABLET | Freq: Four times a day (QID) | ORAL | Status: DC | PRN
  Administered 2014-01-07 – 2014-01-10 (×3): 650 mg via ORAL
  Filled 2014-01-05 (×3): qty 2

## 2014-01-05 MED ORDER — SODIUM CHLORIDE 0.9 % IJ SOLN
3.0000 mL | Freq: Two times a day (BID) | INTRAMUSCULAR | Status: DC
  Administered 2014-01-06 – 2014-01-12 (×9): 3 mL via INTRAVENOUS

## 2014-01-05 MED ORDER — POLYVINYL ALCOHOL 1.4 % OP SOLN
1.0000 [drp] | Freq: Two times a day (BID) | OPHTHALMIC | Status: DC
  Administered 2014-01-06 – 2014-01-13 (×15): 1 [drp] via OPHTHALMIC
  Filled 2014-01-05: qty 15

## 2014-01-05 MED ORDER — METHYLPREDNISOLONE SODIUM SUCC 125 MG IJ SOLR
60.0000 mg | Freq: Four times a day (QID) | INTRAMUSCULAR | Status: DC
  Administered 2014-01-05 – 2014-01-06 (×4): 60 mg via INTRAVENOUS
  Filled 2014-01-05 (×7): qty 0.96

## 2014-01-05 MED ORDER — ATORVASTATIN CALCIUM 10 MG PO TABS
10.0000 mg | ORAL_TABLET | Freq: Every day | ORAL | Status: DC
  Administered 2014-01-06 – 2014-01-11 (×6): 10 mg via ORAL
  Filled 2014-01-05 (×8): qty 1

## 2014-01-05 MED ORDER — FERROUS GLUCONATE 324 (38 FE) MG PO TABS
324.0000 mg | ORAL_TABLET | Freq: Every day | ORAL | Status: DC
  Administered 2014-01-06: 324 mg via ORAL
  Filled 2014-01-05 (×2): qty 1

## 2014-01-05 MED ORDER — CALCIUM CARBONATE-VITAMIN D 500-200 MG-UNIT PO TABS
1.0000 | ORAL_TABLET | Freq: Two times a day (BID) | ORAL | Status: DC
  Administered 2014-01-06: 1 via ORAL
  Filled 2014-01-05 (×3): qty 1

## 2014-01-05 MED ORDER — VITAMIN D-3 25 MCG (1000 UT) PO CAPS: 2000.0000 [IU] | ORAL_CAPSULE | Freq: Every day | ORAL | Status: DC

## 2014-01-05 MED ORDER — IPRATROPIUM-ALBUTEROL 0.5-2.5 (3) MG/3ML IN SOLN
3.0000 mL | Freq: Once | RESPIRATORY_TRACT | Status: AC
  Administered 2014-01-10: 3 mL via RESPIRATORY_TRACT
  Filled 2014-01-05: qty 3

## 2014-01-05 MED ORDER — OMEGA-3 FATTY ACIDS 1000 MG PO CAPS: 2.0000 g | ORAL_CAPSULE | Freq: Every day | ORAL | Status: DC

## 2014-01-05 MED ORDER — IRON 240 (27 FE) MG PO TABS: 1.0000 | ORAL_TABLET | Freq: Every day | ORAL | Status: DC

## 2014-01-05 NOTE — ED Notes (Signed)
Per EMS, pt found lying on outside by family. Pt sts she fell and hit head cement porch. Denies any LOC. Pt has large hematoma to R Eye and R thigh, Small hematoma to R Hand. Pt pain 10/10. Pt in A-fib and HR 124.

## 2014-01-05 NOTE — ED Notes (Signed)
HR 105

## 2014-01-05 NOTE — ED Provider Notes (Signed)
CSN: [id number]     Arrival date & time 01/05/14  1229 History   First MD Initiated Contact with Patient 01/05/14 1231     Chief Complaint  Patient presents with  . Fall    pt has hematoma to R eye and R hand     (Consider location/radiation/quality/duration/timing/severity/associated sxs/prior Treatment)  HPI Comments: Patient with history of chronic atrial fibrillation on Coumadin, previous history of lung cancer -- presents after a fall. Patient states that she was stepping up on her cement porch when she lost her balance and fell striking her right head and side. She states that she did not feel dizzy prior to this. Family found the patient lying on the ground. They were uncertain how long she laid on the ground. EMS was called. Patient sustained a large hematoma to her right face and eye. She has hematoma to her right thigh. She does not complain of neck pain but was placed in a c-collar prior to arrival. She denies chest pain or abdominal pain. She denies hip pain. No other treatments prior to arrival. Patient complains of shortness of breath. She is rate controlled on metoprolol. The onset of this condition was acute. The course is constant. Aggravating factors: none. Alleviating factors: none.    The history is provided by the patient, medical records and a relative.    Past Medical History  Diagnosis Date  . Lung cancer 09-11-2007    Dr Arlyce Dice did surgery; Dr. Inda Merlin [facility]  . Allergic rhinitis   . Atrial fibrillation   . Hyperlipidemia   . Asthma    Past Surgical History  Procedure Laterality Date  . Inguinal hernia repair      right  . Thyroid surgery      nodule resection  . Lung lobectomy  09-11-2007    right upper lobe   No family history on file. History  Substance Use Topics  . Smoking status: Passive Smoke Exposure - Never Smoker  . Smokeless tobacco: Not on file     Comment: Passive tobacco smoke exposure (husband stopped 20 years ago)  . Alcohol Use:  No   OB History   Grav Para Term Preterm Abortions TAB SAB Ect Mult Living                 Review of Systems  Constitutional: Negative for fatigue.  HENT: Positive for facial swelling. Negative for tinnitus.   Eyes: Negative for photophobia, pain and visual disturbance.  Respiratory: Positive for shortness of breath. Negative for wheezing.   Cardiovascular: Negative for chest pain, palpitations and leg swelling.  Gastrointestinal: Negative for nausea and vomiting.  Musculoskeletal: Positive for myalgias. Negative for back pain and neck pain.  Skin: Negative for wound.  Neurological: Positive for headaches. Negative for dizziness, weakness, light-headedness and numbness.  Psychiatric/Behavioral: Negative for confusion and decreased concentration.    Allergies  Review of patient's allergies indicates no known allergies.  Home Medications   Prior to Admission medications   Medication Sig Start Date End Date Taking? Authorizing Provider  acetaminophen (TYLENOL) 500 MG tablet Take 500 mg by mouth every 6 (six) hours as needed.     Historical Provider, MD  albuterol (PROAIR HFA) 108 (90 BASE) MCG/ACT inhaler Inhale 2 puffs into the lungs every 6 (six) hours as needed for wheezing or shortness of breath. 07/01/13 07/06/14  Deneise Lever, MD  atorvastatin (LIPITOR) 10 MG tablet TAKE 1 TABLET DAILY 08/13/13   Chipper Herb, MD  Calcium  Carbonate-Vitamin D (CALTRATE 600+D) 600-400 MG-UNIT per tablet Take 1 tablet by mouth 2 (two) times daily.      Historical Provider, MD  Cholecalciferol (VITAMIN D-3 PO) Take 2,000 Units by mouth daily.    Historical Provider, MD  Ferrous Gluconate (IRON) 240 (27 FE) MG TABS Take 1 tablet by mouth daily.      Historical Provider, MD  fish oil-omega-3 fatty acids 1000 MG capsule Take 2 g by mouth daily.      Historical Provider, MD  folic acid (FOLVITE) 539 MCG tablet Take 400 mcg by mouth daily.      Historical Provider, MD  levothyroxine (SYNTHROID,  LEVOTHROID) 75 MCG tablet TAKE 1 TABLET DAILY 09/11/13   Chipper Herb, MD  metoprolol succinate (TOPROL-XL) 100 MG 24 hr tablet TAKE 1 TABLET DAILY 06/21/13   Minus Breeding, MD  omeprazole (PRILOSEC) 20 MG capsule Take 1 capsule (20 mg total) by mouth daily. 12/17/12   Chipper Herb, MD  Polyethyl Glycol-Propyl Glycol (SYSTANE) 0.4-0.3 % SOLN Place 1 drop into both eyes 2 (two) times daily.      Historical Provider, MD  SPIRIVA HANDIHALER 18 MCG inhalation capsule INHALE 1 CAPSULE CONTENTS ONCE DAILY 05/10/13   Chipper Herb, MD  SYMBICORT 160-4.5 MCG/ACT inhaler USE 2 PUFFS TWICE DAILY RINSE MOUTH AFTER USING    Chipper Herb, MD  traMADol (ULTRAM) 50 MG tablet TAKE 1/2 TABLET DAILY AS NEEDED FOR SEVERE PAIN    Chipper Herb, MD  TRAVATAN Z 0.004 % SOLN ophthalmic solution Place 1 drop into both eyes at bedtime.  12/09/11   Historical Provider, MD  warfarin (COUMADIN) 5 MG tablet Take 1 tablet (5 mg total) by mouth daily. 11/14/13   Chipper Herb, MD  zoledronic acid (RECLAST) 5 MG/100ML SOLN Inject 5 mg into the vein once. Given 10-03-12; gets yearly at [facility] Provider, MD   Pulse 138  Temp(Src) 98.9 F (37.2 C) (Oral)  Resp 28  Ht 5' (1.524 m)  Wt 135 lb (61.236 kg)  BMI 26.37 kg/m2  SpO2 92%  Physical Exam  Nursing note and vitals reviewed. Constitutional: She is oriented to person, place, and time. She appears well-developed and well-nourished.  HENT:  Head: Normocephalic. Head is without raccoon's eyes and without Battle's sign.  Right Ear: Tympanic membrane, external ear and ear canal normal. No hemotympanum.  Left Ear: Tympanic membrane, external ear and ear canal normal. No hemotympanum.  Nose: Nose normal. No nasal septal hematoma.  Mouth/Throat: Uvula is midline, oropharynx is clear and moist and mucous membranes are normal.  Large hematoma over right face involving right orbit. Eye is swollen shut. Associated abrasions.  Eyes: Lids are normal. Right eye  exhibits no nystagmus. Left eye exhibits no nystagmus.  L eye normal. Unable to evaluate R eye due to significant swelling of R face. There is mildly oozing R cheek abrasion, clean.   Neck: Neck supple.  Immobilized in cervical collar.  Cardiovascular: An irregularly irregular rhythm present. Tachycardia present.   No murmur heard. Pulses:      Radial pulses are 2+ on the right side, and 2+ on the left side.       Dorsalis pedis pulses are 2+ on the right side, and 2+ on the left side.       Posterior tibial pulses are 2+ on the right side, and 2+ on the left side.  Pulmonary/Chest: No accessory muscle usage. Tachypnea noted. No respiratory distress. She  has no decreased breath sounds. She has no wheezes. She has rhonchi (scattered). She has no rales.  Abdominal: Soft. There is no tenderness. There is no rebound and no guarding.  Musculoskeletal: She exhibits no edema.       Right shoulder: Normal.       Left shoulder: Normal.       Right elbow: Normal.      Left elbow: Normal.       Right wrist: She exhibits tenderness and laceration (abrasion). She exhibits normal range of motion and no bony tenderness.       Left wrist: Normal.       Right hip: Normal.       Left hip: Normal.       Right knee: Normal.       Left knee: Normal.       Right ankle: Normal.       Left ankle: Normal.       Cervical back: She exhibits no tenderness and no bony tenderness. Decreased range of motion: Immobilized in c-collar.       Thoracic back: She exhibits no tenderness and no bony tenderness.       Lumbar back: She exhibits no tenderness and no bony tenderness.       Right upper leg: She exhibits tenderness, bony tenderness and swelling (Approximately 10 cm hematoma).       Left upper leg: Normal.  Neurological: She is alert and oriented to person, place, and time. She has normal strength and normal reflexes. No cranial nerve deficit or sensory deficit. Coordination normal. GCS eye subscore is 4. GCS  verbal subscore is 5. GCS motor subscore is 6.  Skin: Skin is warm and dry.  Psychiatric: She has a normal mood and affect.    ED Course  Procedures (including critical care time) Labs Review Labs Reviewed  CBC - Abnormal; Notable for the following:    WBC 3.6 (*)    Platelets 65 (*)    All other components within normal limits  BASIC METABOLIC PANEL - Abnormal; Notable for the following:    Potassium 3.3 (*)    Glucose, Bld 122 (*)    Creatinine, Ser 0.41 (*)    GFR calc non Af Amer 88 (*)    All other components within normal limits  PROTIME-INR - Abnormal; Notable for the following:    Prothrombin Time 36.1 (*)    INR 3.81 (*)    All other components within normal limits    Imaging Review Dg Chest 1 View  01/05/2014   CLINICAL DATA:  Lung cancer, asthma  EXAM: CHEST - 1 VIEW  COMPARISON:  CT 10/21/2013  FINDINGS: Cardiac silhouette is enlarged but unchanged. Large hiatal hernia noted. Chronic bronchitic markings noted within the lungs. No focal consolidation. No pneumothorax.  IMPRESSION: 1.  No acute cardiopulmonary process.  . 2. Cardiomegaly and chronic bronchitic markings.   Electronically Signed   By: Suzy Bouchard M.D.   On: 01/05/2014 14:05   Dg Femur Left  01/05/2014   CLINICAL DATA:  Left leg pain  EXAM: LEFT FEMUR - 2 VIEW  COMPARISON:  None.  FINDINGS: Mild degenerative changes of left knee joint are seen. Diffuse vascular calcifications are noted. No acute fracture or dislocation is seen.  IMPRESSION: Chronic changes without acute abnormality.   Electronically Signed   By: Inez Catalina M.D.   On: 01/05/2014 14:05   Dg Femur Right  01/05/2014   CLINICAL DATA:  Patient fell today  EXAM: RIGHT FEMUR - 2 VIEW  COMPARISON:  None.  FINDINGS: There is no evidence of fracture or other focal bone lesions. Soft tissues are unremarkable. There is peripheral vascular atherosclerotic disease.  IMPRESSION: Negative.   Electronically Signed   By: Kathreen Devoid   On: 01/05/2014  14:23   Ct Head Wo Contrast  01/05/2014   CLINICAL DATA:  Status post fall  EXAM: CT HEAD WITHOUT CONTRAST  CT MAXILLOFACIAL WITHOUT CONTRAST  CT CERVICAL SPINE WITHOUT CONTRAST  TECHNIQUE: Multidetector CT imaging of the head, cervical spine, and maxillofacial structures were performed using the standard protocol without intravenous contrast. Multiplanar CT image reconstructions of the cervical spine and maxillofacial structures were also generated.  COMPARISON:  None.  FINDINGS: CT HEAD FINDINGS  There is no evidence of mass effect, midline shift, or extra-axial fluid collections. There is no evidence of a space-occupying lesion or intracranial hemorrhage. There is no evidence of a cortical-based area of acute infarction. There is generalized cerebral atrophy. There is periventricular white matter low attenuation likely secondary to microangiopathy.  The ventricles and sulci are appropriate for the patient's age. The basal cisterns are patent.  Visualized portions of the orbits are unremarkable. The mastoid air cells are clear. There is an air-fluid level in the right sphenoid sinus. Bilateral ethmoid sinus mucosal thickening. Cerebrovascular atherosclerotic calcifications are noted.  The osseous structures are unremarkable. Severe right facial and right periorbital soft tissue swelling and hematoma.  CT MAXILLOFACIAL FINDINGS  Severe right periorbital and right facial soft tissue swelling as well as hematoma formation. The globes are intact. The orbital walls are intact. The orbital floors are intact. The maxilla is intact. The mandible is intact. The zygomatic arches are intact. The nasal septum is midline. There is no nasal bone fracture. The temporomandibular joints are normal.  Right sphenoid sinus air-fluid level. Bilateral ethmoid sinus mucosal thickening. The visualized portions of the mastoid sinuses are well aerated.  CT CERVICAL SPINE FINDINGS  The alignment is anatomic. The vertebral body heights  are maintained. There is no acute fracture. There is no static listhesis. The prevertebral soft tissues are normal. The intraspinal soft tissues are not fully imaged on this examination due to poor soft tissue contrast, but there is no gross soft tissue abnormality.  There is mild degenerative disc disease throughout the cervical spine. There are mild broad-based disc bulges at C3-4, C4-5, C5-6 and C6-7. At C3-4 there is bilateral facet arthropathy and bilateral uncovertebral degenerative change with left foraminal stenosis. At C4-5 there is bilateral facet arthropathy and uncovertebral degenerative change with bilateral foraminal stenosis, left greater than right. At C5-6 there is left uncovertebral degenerative change.  The visualized portions of the lung apices demonstrate no focal abnormality.There is bilateral carotid artery atherosclerosis.  IMPRESSION: 1. No acute intracranial pathology. 2. No acute osseous injury of the maxillofacial bones. Severe right facial soft tissue swelling and right periorbital soft tissue swelling as well as hematoma formation. 3. No acute osseous injury of the cervical spine. 4. Cervical spine spondylosis as described above.   Electronically Signed   By: Kathreen Devoid   On: 01/05/2014 13:56   Ct Cervical Spine Wo Contrast  01/05/2014   CLINICAL DATA:  Status post fall  EXAM: CT HEAD WITHOUT CONTRAST  CT MAXILLOFACIAL WITHOUT CONTRAST  CT CERVICAL SPINE WITHOUT CONTRAST  TECHNIQUE: Multidetector CT imaging of the head, cervical spine, and maxillofacial structures were performed using the standard protocol without intravenous contrast. Multiplanar CT image reconstructions of the cervical  spine and maxillofacial structures were also generated.  COMPARISON:  None.  FINDINGS: CT HEAD FINDINGS  There is no evidence of mass effect, midline shift, or extra-axial fluid collections. There is no evidence of a space-occupying lesion or intracranial hemorrhage. There is no evidence of a  cortical-based area of acute infarction. There is generalized cerebral atrophy. There is periventricular white matter low attenuation likely secondary to microangiopathy.  The ventricles and sulci are appropriate for the patient's age. The basal cisterns are patent.  Visualized portions of the orbits are unremarkable. The mastoid air cells are clear. There is an air-fluid level in the right sphenoid sinus. Bilateral ethmoid sinus mucosal thickening. Cerebrovascular atherosclerotic calcifications are noted.  The osseous structures are unremarkable. Severe right facial and right periorbital soft tissue swelling and hematoma.  CT MAXILLOFACIAL FINDINGS  Severe right periorbital and right facial soft tissue swelling as well as hematoma formation. The globes are intact. The orbital walls are intact. The orbital floors are intact. The maxilla is intact. The mandible is intact. The zygomatic arches are intact. The nasal septum is midline. There is no nasal bone fracture. The temporomandibular joints are normal.  Right sphenoid sinus air-fluid level. Bilateral ethmoid sinus mucosal thickening. The visualized portions of the mastoid sinuses are well aerated.  CT CERVICAL SPINE FINDINGS  The alignment is anatomic. The vertebral body heights are maintained. There is no acute fracture. There is no static listhesis. The prevertebral soft tissues are normal. The intraspinal soft tissues are not fully imaged on this examination due to poor soft tissue contrast, but there is no gross soft tissue abnormality.  There is mild degenerative disc disease throughout the cervical spine. There are mild broad-based disc bulges at C3-4, C4-5, C5-6 and C6-7. At C3-4 there is bilateral facet arthropathy and bilateral uncovertebral degenerative change with left foraminal stenosis. At C4-5 there is bilateral facet arthropathy and uncovertebral degenerative change with bilateral foraminal stenosis, left greater than right. At C5-6 there is left  uncovertebral degenerative change.  The visualized portions of the lung apices demonstrate no focal abnormality.There is bilateral carotid artery atherosclerosis.  IMPRESSION: 1. No acute intracranial pathology. 2. No acute osseous injury of the maxillofacial bones. Severe right facial soft tissue swelling and right periorbital soft tissue swelling as well as hematoma formation. 3. No acute osseous injury of the cervical spine. 4. Cervical spine spondylosis as described above.   Electronically Signed   By: Kathreen Devoid   On: 01/05/2014 13:56   Ct Maxillofacial Wo Cm  01/05/2014   CLINICAL DATA:  Status post fall  EXAM: CT HEAD WITHOUT CONTRAST  CT MAXILLOFACIAL WITHOUT CONTRAST  CT CERVICAL SPINE WITHOUT CONTRAST  TECHNIQUE: Multidetector CT imaging of the head, cervical spine, and maxillofacial structures were performed using the standard protocol without intravenous contrast. Multiplanar CT image reconstructions of the cervical spine and maxillofacial structures were also generated.  COMPARISON:  None.  FINDINGS: CT HEAD FINDINGS  There is no evidence of mass effect, midline shift, or extra-axial fluid collections. There is no evidence of a space-occupying lesion or intracranial hemorrhage. There is no evidence of a cortical-based area of acute infarction. There is generalized cerebral atrophy. There is periventricular white matter low attenuation likely secondary to microangiopathy.  The ventricles and sulci are appropriate for the patient's age. The basal cisterns are patent.  Visualized portions of the orbits are unremarkable. The mastoid air cells are clear. There is an air-fluid level in the right sphenoid sinus. Bilateral ethmoid sinus mucosal thickening. Cerebrovascular  atherosclerotic calcifications are noted.  The osseous structures are unremarkable. Severe right facial and right periorbital soft tissue swelling and hematoma.  CT MAXILLOFACIAL FINDINGS  Severe right periorbital and right facial soft  tissue swelling as well as hematoma formation. The globes are intact. The orbital walls are intact. The orbital floors are intact. The maxilla is intact. The mandible is intact. The zygomatic arches are intact. The nasal septum is midline. There is no nasal bone fracture. The temporomandibular joints are normal.  Right sphenoid sinus air-fluid level. Bilateral ethmoid sinus mucosal thickening. The visualized portions of the mastoid sinuses are well aerated.  CT CERVICAL SPINE FINDINGS  The alignment is anatomic. The vertebral body heights are maintained. There is no acute fracture. There is no static listhesis. The prevertebral soft tissues are normal. The intraspinal soft tissues are not fully imaged on this examination due to poor soft tissue contrast, but there is no gross soft tissue abnormality.  There is mild degenerative disc disease throughout the cervical spine. There are mild broad-based disc bulges at C3-4, C4-5, C5-6 and C6-7. At C3-4 there is bilateral facet arthropathy and bilateral uncovertebral degenerative change with left foraminal stenosis. At C4-5 there is bilateral facet arthropathy and uncovertebral degenerative change with bilateral foraminal stenosis, left greater than right. At C5-6 there is left uncovertebral degenerative change.  The visualized portions of the lung apices demonstrate no focal abnormality.There is bilateral carotid artery atherosclerosis.  IMPRESSION: 1. No acute intracranial pathology. 2. No acute osseous injury of the maxillofacial bones. Severe right facial soft tissue swelling and right periorbital soft tissue swelling as well as hematoma formation. 3. No acute osseous injury of the cervical spine. 4. Cervical spine spondylosis as described above.   Electronically Signed   By: Kathreen Devoid   On: 01/05/2014 13:56     EKG Interpretation   Date/Time:  Sunday Jan 05 2014 12:32:35 EDT Ventricular Rate:  135 PR Interval:    QRS Duration: 88 QT Interval:  334 QTC  Calculation: 501 R Axis:   98 Text Interpretation:  Atrial fibrillation with rapid ventricular response  Consider right ventricular hypertrophy Borderline repol abnrm,  anterolateral leads Borderline prolonged QT interval rapid rate, but not  other significant changes noted Confirmed by CAMPOS  MD, KEVIN ([id number]) on  01/05/2014 12:53:11 PM      12 :53 PM Patient seen and examined. Work-up initiated. Medications ordered. Discussed with Dr. Venora Maples who will see. Updated family at bedside. Pt stable. No distress.   Vital signs reviewed and are as follows: Filed Vitals:   01/05/14 1244  Pulse: 138  Temp: 98.9 F (37.2 C)  Resp: 28   3:56 PM Exam stable. HR to 100-110 on cardizem drip.   Pt seen by Dr. Venora Maples. Will call for admission: afib with rvr, supratherapeutic INR with head injury.   4:15 PM Spoke with Dr. Carles Collet who will see.     MDM   Final diagnoses:  Atrial fibrillation with RVR  Facial hematoma  Hematoma of right thigh  Fall   Admit for above.     Carlisle Cater, PA-C 01/05/14 1616

## 2014-01-05 NOTE — ED Notes (Signed)
Admitting MD at bedside.

## 2014-01-05 NOTE — Progress Notes (Signed)
received  Pt. From ed. Lac to face bleeding sob called rapid response nurse to evaluate paged doctor to update.

## 2014-01-05 NOTE — H&P (Signed)
History and Physical  Erica [name] SWH:[id number] DOB: Aug 10, 1920 DOA: 01/05/2014   PCP: Redge Gainer, MD   Chief Complaint: Mechanical fall with facial hematoma  HPI:  80 year old female with a history of chronic atrial fibrillation on warfarin, non-small cell lung cancer status post right upper lobe lobectomy, COPD, hypothyroidism, and hyperlipidemia presented to ED after a mechanical fall sustained to her face. The patient denied any syncope. The patient actually waited until her family came to visit her 4 hours later before coming to the emergency department. She presently denies any fevers, chills, chest pain, nausea, vomiting, diarrhea, abdominal pain. She complains of some pain on her face as well as her right thigh. Notably, the patient states that she has had some increasing shortness of breath for the past 2-3 days with increasing nonproductive cough. She denies any hemoptysis. She denies any fevers. She feels that she has been wheezing. In the emergency department, the patient was noted to have atrial fibrillation with RVR with a heart rate 130 to 140. She was also given a 10 mg diltiazem bolus prior to starting the drip The patient was started on a diltiazem drip with a heart rate improving to 95-105. CT of the face revealed no fractures of the orbit or maxilla or zygoma but showed right facial hematoma and swelling. CT of the brain was negative for any acute intracranial abnormality. CT of the cervical spine was negative for any osseous injury. Potassium was 3.3 with unremarkable BMP. Platelets are 65,000. INR was 3.81.  The patient was hemodynamically stable. X-rays of her left and right hip were negative for any fractures.   Assessment/Plan: Atrial fibrillation with RVR -due to COPD exacerbation -Continue diltiazem drip--anticipate this can be titrated down as her COPD exacerbation improves -Continue metoprolol succinate -TSH -Echocardiogram COPD exacerbation -Solu-Medrol 60  mg IV every 6 hours -Aerosolized albuterol and Atrovent -Hold antibiotics at this time as the patient does not have any fevers and concerns of interactions with the patient's warfarin and patient with supratherapeutic INR and hematoma Facial and right thigh hematoma/supratherapeutic INR -Given the severity of the facial hematoma, give 1 unit of FFP -Hold Coumadin -I have discussed the risks involved of ultimately restarting warfarin when the patient is stable--family will make a final decision but appear to be in favor of stopping warfarin altogether -Check INR in the morning -discussed with Dr. Prudencio Burly (ophthalmology) about eye-->no need for intervention as long as vision does not worsen and EOMI with intact pupillary reflexes Hypothyroidism -Continue Synthroid -Check TSH Hypokalemia -Replete -Check magnesium       Past Medical History  Diagnosis Date  . Lung cancer 09-11-2007    Dr Arlyce Dice did surgery; Dr. Inda Merlin at cancer ctr  . Allergic rhinitis   . Atrial fibrillation   . Hyperlipidemia   . Asthma    Past Surgical History  Procedure Laterality Date  . Inguinal hernia repair      right  . Thyroid surgery      nodule resection  . Lung lobectomy  09-11-2007    right upper lobe   Social History:  reports that she has been passively smoking.  She does not have any smokeless tobacco history on file. She reports that she does not drink alcohol or use illicit drugs.   History reviewed. No pertinent family history.   No Known Allergies    Prior to Admission medications   Medication Sig Start Date End Date Taking? Authorizing Provider  acetaminophen (TYLENOL) 500  MG tablet Take 500 mg by mouth every 6 (six) hours as needed for mild pain.    Yes Historical Provider, MD  albuterol (PROVENTIL HFA;VENTOLIN HFA) 108 (90 BASE) MCG/ACT inhaler Inhale 1-2 puffs into the lungs every 6 (six) hours as needed for wheezing or shortness of breath.   Yes Historical Provider, MD    atorvastatin (LIPITOR) 10 MG tablet Take 10 mg by mouth daily.   Yes Historical Provider, MD  budesonide-formoterol (SYMBICORT) 160-4.5 MCG/ACT inhaler Inhale 2 puffs into the lungs 2 (two) times daily.   Yes Historical Provider, MD  Calcium Carbonate-Vitamin D (CALTRATE 600+D) 600-400 MG-UNIT per tablet Take 1 tablet by mouth 2 (two) times daily.     Yes Historical Provider, MD  Cholecalciferol (VITAMIN D-3 PO) Take 2,000 Units by mouth daily.   Yes Historical Provider, MD  Ferrous Gluconate (IRON) 240 (27 FE) MG TABS Take 1 tablet by mouth daily.     Yes Historical Provider, MD  fish oil-omega-3 fatty acids 1000 MG capsule Take 2 g by mouth daily.     Yes Historical Provider, MD  folic acid (FOLVITE) 063 MCG tablet Take 400 mcg by mouth daily.     Yes Historical Provider, MD  guaiFENesin (MUCINEX) 600 MG 12 hr tablet Take 600 mg by mouth 2 (two) times daily as needed for cough.   Yes Historical Provider, MD  levothyroxine (SYNTHROID, LEVOTHROID) 75 MCG tablet Take 75 mcg by mouth daily before breakfast.   Yes Historical Provider, MD  metoprolol succinate (TOPROL-XL) 100 MG 24 hr tablet Take 100 mg by mouth daily. Take with or immediately following a meal.   Yes Historical Provider, MD  omeprazole (PRILOSEC) 20 MG capsule Take 20 mg by mouth daily.   Yes Historical Provider, MD  Polyethyl Glycol-Propyl Glycol (SYSTANE) 0.4-0.3 % SOLN Place 1 drop into both eyes 2 (two) times daily.     Yes Historical Provider, MD  tiotropium (SPIRIVA) 18 MCG inhalation capsule Place 18 mcg into inhaler and inhale daily.   Yes Historical Provider, MD  TRAVATAN Z 0.004 % SOLN ophthalmic solution Place 1 drop into both eyes at bedtime.  12/09/11  Yes Historical Provider, MD  warfarin (COUMADIN) 5 MG tablet Take 5 mg by mouth daily.   Yes Historical Provider, MD  zoledronic acid (RECLAST) 5 MG/100ML SOLN Inject 5 mg into the vein once. Given 10-03-12; gets yearly at [place] [facility]   Yes Historical Provider, MD    Review  of Systems:  Constitutional:  No weight loss, night sweats, Fevers, chills, fatigue.  Head&Eyes: No headache.  No vision loss.  No eye pain or scotoma ENT:  No Difficulty swallowing,Tooth/dental problems,Sore throat,  No ear ache, post nasal drip,  Cardio-vascular:  No chest pain, Orthopnea, PND, swelling in lower extremities,  dizziness, palpitations  GI:  No  abdominal pain, nausea, vomiting, diarrhea, loss of appetite, hematochezia, melena, heartburn, indigestion, Resp:   No coughing up of blood .No wheezing.No chest wall deformity  Skin:  no rash or lesions.  GU:  no dysuria, change in color of urine, no urgency or frequency. No flank pain.  Musculoskeletal:  No joint pain or swelling. No decreased range of motion. No back pain.  Psych:  No change in mood or affect. No depression or anxiety. Neurologic: No headache, no dysesthesia, no focal weakness, no vision loss. No syncope  Physical Exam: Filed Vitals:   01/05/14 1452 01/05/14 1515 01/05/14 1615 01/05/14 1641  BP: 114/59 108/57 105/59 113/49  Pulse: 115 104  97 101  Temp:      TempSrc:      Resp: 22 34 39 33  Height:      Weight:      SpO2: 99% 99% 100% 99%   General:  A&O x 3, NAD, nontoxic, pleasant/cooperative Head/Eye: No conjunctival hemorrhage, no icterus, [place]/AT, No nystagmus ENT:  No icterus,   no pharyngeal exudate; right facial hematoma; subconjunctival hemorrhage present; EOMI, PERRL Neck:  No masses, no lymphadenpathy, no bruits CV:  IRRR, no rub, no gallop Lung:  Bilateral expiratory wheeze and rhonchi. Good air movement Abdomen: soft/NT, +BS, nondistended, no peritoneal signs Ext: No cyanosis, No rashes, No petechiae, No lymphangitis. Hematoma of the lateral right thigh without crepitance. Neuro: CNII-XII intact, strength 4/5 in bilateral upper and lower extremities, no dysmetria  Labs on Admission:  Basic Metabolic Panel:  Recent Labs Lab 01/05/14 1251  NA 141  K 3.3*  CL 100  CO2 27    GLUCOSE 122*  BUN 21  CREATININE 0.41*  CALCIUM 8.5   Liver Function Tests: No results found for this basename: AST, ALT, ALKPHOS, BILITOT, PROT, ALBUMIN,  in the last 168 hours No results found for this basename: LIPASE, AMYLASE,  in the last 168 hours No results found for this basename: AMMONIA,  in the last 168 hours CBC:  Recent Labs Lab 01/05/14 1251  WBC 3.6*  HGB 12.9  HCT 39.3  MCV 91.6  PLT 65*   Cardiac Enzymes: No results found for this basename: CKTOTAL, CKMB, CKMBINDEX, TROPONINI,  in the last 168 hours BNP: No components found with this basename: POCBNP,  CBG: No results found for this basename: GLUCAP,  in the last 168 hours  Radiological Exams on Admission: Dg Chest 1 View  01/05/2014   CLINICAL DATA:  Lung cancer, asthma  EXAM: CHEST - 1 VIEW  COMPARISON:  CT 10/21/2013  FINDINGS: Cardiac silhouette is enlarged but unchanged. Large hiatal hernia noted. Chronic bronchitic markings noted within the lungs. No focal consolidation. No pneumothorax.  IMPRESSION: 1.  No acute cardiopulmonary process.  . 2. Cardiomegaly and chronic bronchitic markings.   Electronically Signed   By: Suzy Bouchard M.D.   On: 01/05/2014 14:05   Dg Femur Left  01/05/2014   CLINICAL DATA:  Left leg pain  EXAM: LEFT FEMUR - 2 VIEW  COMPARISON:  None.  FINDINGS: Mild degenerative changes of left knee joint are seen. Diffuse vascular calcifications are noted. No acute fracture or dislocation is seen.  IMPRESSION: Chronic changes without acute abnormality.   Electronically Signed   By: Inez Catalina M.D.   On: 01/05/2014 14:05   Dg Femur Right  01/05/2014   CLINICAL DATA:  Patient fell today  EXAM: RIGHT FEMUR - 2 VIEW  COMPARISON:  None.  FINDINGS: There is no evidence of fracture or other focal bone lesions. Soft tissues are unremarkable. There is peripheral vascular atherosclerotic disease.  IMPRESSION: Negative.   Electronically Signed   By: Kathreen Devoid   On: 01/05/2014 14:23   Ct Head  Wo Contrast  01/05/2014   CLINICAL DATA:  Status post fall  EXAM: CT HEAD WITHOUT CONTRAST  CT MAXILLOFACIAL WITHOUT CONTRAST  CT CERVICAL SPINE WITHOUT CONTRAST  TECHNIQUE: Multidetector CT imaging of the head, cervical spine, and maxillofacial structures were performed using the standard protocol without intravenous contrast. Multiplanar CT image reconstructions of the cervical spine and maxillofacial structures were also generated.  COMPARISON:  None.  FINDINGS: CT HEAD FINDINGS  There is no evidence  of mass effect, midline shift, or extra-axial fluid collections. There is no evidence of a space-occupying lesion or intracranial hemorrhage. There is no evidence of a cortical-based area of acute infarction. There is generalized cerebral atrophy. There is periventricular white matter low attenuation likely secondary to microangiopathy.  The ventricles and sulci are appropriate for the patient's age. The basal cisterns are patent.  Visualized portions of the orbits are unremarkable. The mastoid air cells are clear. There is an air-fluid level in the right sphenoid sinus. Bilateral ethmoid sinus mucosal thickening. Cerebrovascular atherosclerotic calcifications are noted.  The osseous structures are unremarkable. Severe right facial and right periorbital soft tissue swelling and hematoma.  CT MAXILLOFACIAL FINDINGS  Severe right periorbital and right facial soft tissue swelling as well as hematoma formation. The globes are intact. The orbital walls are intact. The orbital floors are intact. The maxilla is intact. The mandible is intact. The zygomatic arches are intact. The nasal septum is midline. There is no nasal bone fracture. The temporomandibular joints are normal.  Right sphenoid sinus air-fluid level. Bilateral ethmoid sinus mucosal thickening. The visualized portions of the mastoid sinuses are well aerated.  CT CERVICAL SPINE FINDINGS  The alignment is anatomic. The vertebral body heights are maintained.  There is no acute fracture. There is no static listhesis. The prevertebral soft tissues are normal. The intraspinal soft tissues are not fully imaged on this examination due to poor soft tissue contrast, but there is no gross soft tissue abnormality.  There is mild degenerative disc disease throughout the cervical spine. There are mild broad-based disc bulges at C3-4, C4-5, C5-6 and C6-7. At C3-4 there is bilateral facet arthropathy and bilateral uncovertebral degenerative change with left foraminal stenosis. At C4-5 there is bilateral facet arthropathy and uncovertebral degenerative change with bilateral foraminal stenosis, left greater than right. At C5-6 there is left uncovertebral degenerative change.  The visualized portions of the lung apices demonstrate no focal abnormality.There is bilateral carotid artery atherosclerosis.  IMPRESSION: 1. No acute intracranial pathology. 2. No acute osseous injury of the maxillofacial bones. Severe right facial soft tissue swelling and right periorbital soft tissue swelling as well as hematoma formation. 3. No acute osseous injury of the cervical spine. 4. Cervical spine spondylosis as described above.   Electronically Signed   By: Kathreen Devoid   On: 01/05/2014 13:56   Ct Cervical Spine Wo Contrast  01/05/2014   CLINICAL DATA:  Status post fall  EXAM: CT HEAD WITHOUT CONTRAST  CT MAXILLOFACIAL WITHOUT CONTRAST  CT CERVICAL SPINE WITHOUT CONTRAST  TECHNIQUE: Multidetector CT imaging of the head, cervical spine, and maxillofacial structures were performed using the standard protocol without intravenous contrast. Multiplanar CT image reconstructions of the cervical spine and maxillofacial structures were also generated.  COMPARISON:  None.  FINDINGS: CT HEAD FINDINGS  There is no evidence of mass effect, midline shift, or extra-axial fluid collections. There is no evidence of a space-occupying lesion or intracranial hemorrhage. There is no evidence of a cortical-based area  of acute infarction. There is generalized cerebral atrophy. There is periventricular white matter low attenuation likely secondary to microangiopathy.  The ventricles and sulci are appropriate for the patient's age. The basal cisterns are patent.  Visualized portions of the orbits are unremarkable. The mastoid air cells are clear. There is an air-fluid level in the right sphenoid sinus. Bilateral ethmoid sinus mucosal thickening. Cerebrovascular atherosclerotic calcifications are noted.  The osseous structures are unremarkable. Severe right facial and right periorbital soft tissue swelling and  hematoma.  CT MAXILLOFACIAL FINDINGS  Severe right periorbital and right facial soft tissue swelling as well as hematoma formation. The globes are intact. The orbital walls are intact. The orbital floors are intact. The maxilla is intact. The mandible is intact. The zygomatic arches are intact. The nasal septum is midline. There is no nasal bone fracture. The temporomandibular joints are normal.  Right sphenoid sinus air-fluid level. Bilateral ethmoid sinus mucosal thickening. The visualized portions of the mastoid sinuses are well aerated.  CT CERVICAL SPINE FINDINGS  The alignment is anatomic. The vertebral body heights are maintained. There is no acute fracture. There is no static listhesis. The prevertebral soft tissues are normal. The intraspinal soft tissues are not fully imaged on this examination due to poor soft tissue contrast, but there is no gross soft tissue abnormality.  There is mild degenerative disc disease throughout the cervical spine. There are mild broad-based disc bulges at C3-4, C4-5, C5-6 and C6-7. At C3-4 there is bilateral facet arthropathy and bilateral uncovertebral degenerative change with left foraminal stenosis. At C4-5 there is bilateral facet arthropathy and uncovertebral degenerative change with bilateral foraminal stenosis, left greater than right. At C5-6 there is left uncovertebral  degenerative change.  The visualized portions of the lung apices demonstrate no focal abnormality.There is bilateral carotid artery atherosclerosis.  IMPRESSION: 1. No acute intracranial pathology. 2. No acute osseous injury of the maxillofacial bones. Severe right facial soft tissue swelling and right periorbital soft tissue swelling as well as hematoma formation. 3. No acute osseous injury of the cervical spine. 4. Cervical spine spondylosis as described above.   Electronically Signed   By: Kathreen Devoid   On: 01/05/2014 13:56   Ct Maxillofacial Wo Cm  01/05/2014   CLINICAL DATA:  Status post fall  EXAM: CT HEAD WITHOUT CONTRAST  CT MAXILLOFACIAL WITHOUT CONTRAST  CT CERVICAL SPINE WITHOUT CONTRAST  TECHNIQUE: Multidetector CT imaging of the head, cervical spine, and maxillofacial structures were performed using the standard protocol without intravenous contrast. Multiplanar CT image reconstructions of the cervical spine and maxillofacial structures were also generated.  COMPARISON:  None.  FINDINGS: CT HEAD FINDINGS  There is no evidence of mass effect, midline shift, or extra-axial fluid collections. There is no evidence of a space-occupying lesion or intracranial hemorrhage. There is no evidence of a cortical-based area of acute infarction. There is generalized cerebral atrophy. There is periventricular white matter low attenuation likely secondary to microangiopathy.  The ventricles and sulci are appropriate for the patient's age. The basal cisterns are patent.  Visualized portions of the orbits are unremarkable. The mastoid air cells are clear. There is an air-fluid level in the right sphenoid sinus. Bilateral ethmoid sinus mucosal thickening. Cerebrovascular atherosclerotic calcifications are noted.  The osseous structures are unremarkable. Severe right facial and right periorbital soft tissue swelling and hematoma.  CT MAXILLOFACIAL FINDINGS  Severe right periorbital and right facial soft tissue swelling  as well as hematoma formation. The globes are intact. The orbital walls are intact. The orbital floors are intact. The maxilla is intact. The mandible is intact. The zygomatic arches are intact. The nasal septum is midline. There is no nasal bone fracture. The temporomandibular joints are normal.  Right sphenoid sinus air-fluid level. Bilateral ethmoid sinus mucosal thickening. The visualized portions of the mastoid sinuses are well aerated.  CT CERVICAL SPINE FINDINGS  The alignment is anatomic. The vertebral body heights are maintained. There is no acute fracture. There is no static listhesis. The prevertebral soft tissues  are normal. The intraspinal soft tissues are not fully imaged on this examination due to poor soft tissue contrast, but there is no gross soft tissue abnormality.  There is mild degenerative disc disease throughout the cervical spine. There are mild broad-based disc bulges at C3-4, C4-5, C5-6 and C6-7. At C3-4 there is bilateral facet arthropathy and bilateral uncovertebral degenerative change with left foraminal stenosis. At C4-5 there is bilateral facet arthropathy and uncovertebral degenerative change with bilateral foraminal stenosis, left greater than right. At C5-6 there is left uncovertebral degenerative change.  The visualized portions of the lung apices demonstrate no focal abnormality.There is bilateral carotid artery atherosclerosis.  IMPRESSION: 1. No acute intracranial pathology. 2. No acute osseous injury of the maxillofacial bones. Severe right facial soft tissue swelling and right periorbital soft tissue swelling as well as hematoma formation. 3. No acute osseous injury of the cervical spine. 4. Cervical spine spondylosis as described above.   Electronically Signed   By: Kathreen Devoid   On: 01/05/2014 13:56    EKG: Independently reviewed. Atrial fibrillation    Time spent:70 minutes Code Status:   DNR Family Communication:   duaghter at bedside   Orson Eva, DO  [facility] Pager [phone]  If 7PM-7AM, please contact night-coverage [email or web address] Password [email or web address] 01/05/2014, 5:03 PM

## 2014-01-05 NOTE — ED Provider Notes (Signed)
Medical screening examination/treatment/procedure(s) were conducted as a shared visit with non-physician practitioner(s) and myself.  I personally evaluated the patient during the encounter.   EKG Interpretation   Date/Time:  Sunday Jan 05 2014 12:32:35 EDT Ventricular Rate:  135 PR Interval:    QRS Duration: 88 QT Interval:  334 QTC Calculation: 501 R Axis:   98 Text Interpretation:  Atrial fibrillation with rapid ventricular response  Consider right ventricular hypertrophy Borderline repol abnrm,  anterolateral leads Borderline prolonged QT interval rapid rate, but not  other significant changes noted Confirmed by Tudor Chandley  MD, Keymarion Bearman ([id number]) on  01/05/2014 12:53:11 PM      Hematoma.  No acute osseous injuries.  A. fib with RVR.  On a Cardizem drip.  Will admit for observation and management of her atrial fibrillation.  Her INR is 2.8 and her Coumadin will need to be held.  She will need neurologic checks.  Hoy Morn, MD 01/05/14 [phone]

## 2014-01-05 NOTE — ED Notes (Signed)
Admitting MD at bedside, reports keep Cardizem at 10 mg/hr. HR 102.

## 2014-01-05 NOTE — Progress Notes (Signed)
Doctor tat came to see pt. No change in orders

## 2014-01-05 NOTE — Progress Notes (Signed)
Called to see pt by RN for sob and increasing HR.  Pt c/o sob, but denies cp, n/v/dizziness.  Pt is not in extremis.  Cardizem drip at 15mg /hr.  Pt did not take her metoprolol succinate this am.  HR 100-110 119/76 95-96% on 2L [place]  CV IRRR Lung--bilateral rhonchi with wheezing Abd--soft/ NT+BS Ext- 2+edema  Solumedrol previously ordered not yet given.  I Kindly asked for it to be given ASAP.  I also ordered lasix 40mg  IV x 1 STAT.  CXR in ED @1330  showed b/l scattered interstitial markings without distinct infiltrate.  Albuterol and atrovent neb ordered STAT.  Will keep cardizem at 15mg /hr.  I expect HR to improve with above interventions.  If HR increases again, consider metoprolol IV if BP is ok.  Daughter updated at bedside.  Ordered BNP and troponins.  DTat

## 2014-01-06 ENCOUNTER — Inpatient Hospital Stay (HOSPITAL_COMMUNITY): Payer: Medicare Other

## 2014-01-06 ENCOUNTER — Encounter (HOSPITAL_COMMUNITY): Payer: Self-pay | Admitting: General Practice

## 2014-01-06 DIAGNOSIS — R05 Cough: Secondary | ICD-10-CM

## 2014-01-06 DIAGNOSIS — R059 Cough, unspecified: Secondary | ICD-10-CM

## 2014-01-06 DIAGNOSIS — I369 Nonrheumatic tricuspid valve disorder, unspecified: Secondary | ICD-10-CM

## 2014-01-06 DIAGNOSIS — S7010XA Contusion of unspecified thigh, initial encounter: Secondary | ICD-10-CM

## 2014-01-06 DIAGNOSIS — R509 Fever, unspecified: Secondary | ICD-10-CM | POA: Diagnosis present

## 2014-01-06 DIAGNOSIS — D696 Thrombocytopenia, unspecified: Secondary | ICD-10-CM

## 2014-01-06 DIAGNOSIS — I10 Essential (primary) hypertension: Secondary | ICD-10-CM | POA: Diagnosis present

## 2014-01-06 DIAGNOSIS — Y92009 Unspecified place in unspecified non-institutional (private) residence as the place of occurrence of the external cause: Secondary | ICD-10-CM

## 2014-01-06 DIAGNOSIS — W19XXXA Unspecified fall, initial encounter: Secondary | ICD-10-CM

## 2014-01-06 DIAGNOSIS — S7011XA Contusion of right thigh, initial encounter: Secondary | ICD-10-CM | POA: Diagnosis present

## 2014-01-06 LAB — BASIC METABOLIC PANEL
BUN: 21 mg/dL (ref 6–23)
CHLORIDE: 100 meq/L (ref 96–112)
CO2: 27 mEq/L (ref 19–32)
Calcium: 8.1 mg/dL — ABNORMAL LOW (ref 8.4–10.5)
Creatinine, Ser: 0.48 mg/dL — ABNORMAL LOW (ref 0.50–1.10)
GFR calc non Af Amer: 84 mL/min — ABNORMAL LOW (ref >90)
Glucose, Bld: 137 mg/dL — ABNORMAL HIGH (ref 70–99)
Potassium: 2.6 mEq/L — CL (ref 3.7–5.3)
SODIUM: 142 meq/L (ref 137–147)

## 2014-01-06 LAB — PREPARE FRESH FROZEN PLASMA
Unit division: 0
Unit division: 0

## 2014-01-06 LAB — PROTIME-INR
INR: 2.83 — AB (ref 0.00–1.49)
PROTHROMBIN TIME: 28.8 s — AB (ref 11.6–15.2)

## 2014-01-06 LAB — URINALYSIS, ROUTINE W REFLEX MICROSCOPIC
Bilirubin Urine: NEGATIVE
GLUCOSE, UA: NEGATIVE mg/dL
Ketones, ur: 15 mg/dL — AB
NITRITE: NEGATIVE
PH: 6 (ref 5.0–8.0)
Protein, ur: 100 mg/dL — AB
SPECIFIC GRAVITY, URINE: 1.017 (ref 1.005–1.030)
Urobilinogen, UA: 1 mg/dL (ref 0.0–1.0)

## 2014-01-06 LAB — CBC
HCT: 32 % — ABNORMAL LOW (ref 36.0–46.0)
HEMOGLOBIN: 11 g/dL — AB (ref 12.0–15.0)
MCH: 31.3 pg (ref 26.0–34.0)
MCHC: 34.4 g/dL (ref 30.0–36.0)
MCV: 91.2 fL (ref 78.0–100.0)
PLATELETS: 60 10*3/uL — AB (ref 150–400)
RBC: 3.51 MIL/uL — AB (ref 3.87–5.11)
RDW: 13.9 % (ref 11.5–15.5)
WBC: 3.7 10*3/uL — ABNORMAL LOW (ref 4.0–10.5)

## 2014-01-06 LAB — URINE MICROSCOPIC-ADD ON

## 2014-01-06 LAB — MAGNESIUM: MAGNESIUM: 1.7 mg/dL (ref 1.5–2.5)

## 2014-01-06 LAB — TSH: TSH: 1.19 u[IU]/mL (ref 0.350–4.500)

## 2014-01-06 LAB — TROPONIN I: Troponin I: <0.3 ng/mL (ref <0.30)

## 2014-01-06 MED ORDER — IPRATROPIUM-ALBUTEROL 0.5-2.5 (3) MG/3ML IN SOLN
3.0000 mL | Freq: Three times a day (TID) | RESPIRATORY_TRACT | Status: DC
  Administered 2014-01-07 – 2014-01-09 (×7): 3 mL via RESPIRATORY_TRACT
  Filled 2014-01-06 (×10): qty 3

## 2014-01-06 MED ORDER — SODIUM CHLORIDE 0.9 % IV SOLN
3.0000 g | Freq: Four times a day (QID) | INTRAVENOUS | Status: DC
  Administered 2014-01-06 – 2014-01-07 (×4): 3 g via INTRAVENOUS
  Filled 2014-01-06 (×6): qty 3

## 2014-01-06 MED ORDER — METHYLPREDNISOLONE SODIUM SUCC 125 MG IJ SOLR
60.0000 mg | Freq: Two times a day (BID) | INTRAMUSCULAR | Status: DC
  Administered 2014-01-06: 60 mg via INTRAVENOUS
  Filled 2014-01-06 (×3): qty 0.96

## 2014-01-06 MED ORDER — PHYTONADIONE 5 MG PO TABS
10.0000 mg | ORAL_TABLET | Freq: Once | ORAL | Status: AC
  Administered 2014-01-06: 10 mg via ORAL
  Filled 2014-01-06: qty 2

## 2014-01-06 MED ORDER — METOPROLOL TARTRATE 12.5 MG HALF TABLET
12.5000 mg | ORAL_TABLET | Freq: Two times a day (BID) | ORAL | Status: DC
  Administered 2014-01-06 – 2014-01-07 (×3): 12.5 mg via ORAL
  Filled 2014-01-06 (×4): qty 1

## 2014-01-06 MED ORDER — DEXTROSE 5 % IV SOLN
500.0000 mg | INTRAVENOUS | Status: DC
  Administered 2014-01-06: 500 mg via INTRAVENOUS
  Filled 2014-01-06 (×2): qty 500

## 2014-01-06 MED ORDER — ALBUTEROL SULFATE (2.5 MG/3ML) 0.083% IN NEBU
2.5000 mg | INHALATION_SOLUTION | RESPIRATORY_TRACT | Status: DC | PRN
  Administered 2014-01-09: 2.5 mg via RESPIRATORY_TRACT
  Filled 2014-01-06: qty 3

## 2014-01-06 MED ORDER — POTASSIUM CHLORIDE 10 MEQ/100ML IV SOLN
10.0000 meq | INTRAVENOUS | Status: AC
  Administered 2014-01-06 (×4): 10 meq via INTRAVENOUS
  Filled 2014-01-06 (×4): qty 100

## 2014-01-06 NOTE — Progress Notes (Addendum)
[facility] PROGRESS NOTE  Erica [name] QVZ:[id number] DOB: [birth year] DOA: 01/05/2014 PCP: Redge Gainer, MD  Assessment/Plan:   Atrial fibrillation with RVR: Patient remains on Cardizem drip, but blood pressure borderline. Will decrease to 5 cc an hour. Decrease beta blocker. Monitor closely. Patient is not a candidate for Coumadin any longer, given the severity of her fall, advanced age.  Also, her platelet count is 60,000!  Give another dose of vitamin K, as she is at risk for continued bleeding. Her INR is still above 2 today.    Fever, unspecified: Temperature spike to 103 overnight. Urinalysis unimpressive. Blood cultures pending. Initial chest x-ray showed no infiltrate, but I am concerned about pulmonary infection. Will start antibiotics for possible pneumonia and repeat chest x-ray. Will also get a speech evaluation for swallow function. She is coughing, edentulous, and has very rhonchorous breath sounds. Change diet to mechanical soft and observe strict aspiration precautions.    Hypothyroidism:  TSH normal    COPD exacerbation:  Continue steroids and bronchodilators.    Facial hematoma:  No fractures.    Hypokalemia:  Replete IV    Thrombocytopenia, unspecified: A year ago, platelet count normal. Not on any medications that could cause this. Will follow. Check B12 and folate.    Hematoma of right thigh    Cough: See above    Essential hypertension, benign: Blood pressure borderline low currently. See above.    Fall at home: Will order PT OT. I doubt patient is safe to live alone. She may go home to live with her daughter.  Elevated pro BNP: Chest x-ray showed no CHF. No history of CHF. Await echocardiogram. Blood pressure would not tolerate Lasix at this time.  Code Status:  DO NOT RESUSCITATE Family Communication:  Multiple bedside Disposition Plan:  ?  Consultants:    Procedures:     Antibiotics:  Unasyn, and azithromycin  6/1  HPI/Subjective: Denies pain. Feels short of breath. per family, has chronic dyspnea.  Objective: Filed Vitals:   01/06/14 1330  BP: 100/58  Pulse: 101  Temp: 97.7 F (36.5 C)  Resp: 21    Intake/Output Summary (Last 24 hours) at 01/06/14 1407 Last data filed at 01/06/14 1230  Gross per 24 hour  Intake  702.5 ml  Output    950 ml  Net -247.5 ml   Filed Weights   01/05/14 1244 01/05/14 1750  Weight: 61.236 kg (135 lb) 55.8 kg (123 lb 0.3 oz)   Telemetry: Atrial fibrillation rate about 100  Exam:   General:  Elderly frail female alert oriented and smiling.  HEENT: Bandage over her right thigh. Massive right facial and periorbita full hematoma with conjunctival hemorrhage. Visual Acuity normal, and extraocular movements intact.  Cardiovascular: Irregularly irregular without murmurs gallops rubs  Respiratory: Rhonchi and please and prolonged expiratory phase with tachypnea  Abdomen: Soft nontender nondistended  Ext: Large right thigh hematoma no edema peripherally   Basic Metabolic Panel:  Recent Labs Lab 01/05/14 1251 01/06/14 0017  NA 141 142  K 3.3* 2.6*  CL 100 100  CO2 27 27  GLUCOSE 122* 137*  BUN 21 21  CREATININE 0.41* 0.48*  CALCIUM 8.5 8.1*  MG  --  1.7   Liver Function Tests: No results found for this basename: AST, ALT, ALKPHOS, BILITOT, PROT, ALBUMIN,  in the last 168 hours No results found for this basename: LIPASE, AMYLASE,  in the last 168 hours No results found for this basename: AMMONIA,  in the last 168  hours CBC:  Recent Labs Lab 01/05/14 1251 01/06/14 0017  WBC 3.6* 3.7*  HGB 12.9 11.0*  HCT 39.3 32.0*  MCV 91.6 91.2  PLT 65* 60*   Cardiac Enzymes:  Recent Labs Lab 01/05/14 2023 01/06/14 0017 01/06/14 0907  TROPONINI <0.30 <0.30 <0.30   BNP (last 3 results)  Recent Labs  01/05/14 2023  PROBNP 4533.0*   CBG: No results found for this basename: GLUCAP,  in the last 168 hours  No results found for this or  any previous visit (from the past 240 hour(s)).   Studies: Dg Chest 1 View  01/05/2014   CLINICAL DATA:  Lung cancer, asthma  EXAM: CHEST - 1 VIEW  COMPARISON:  CT 10/21/2013  FINDINGS: Cardiac silhouette is enlarged but unchanged. Large hiatal hernia noted. Chronic bronchitic markings noted within the lungs. No focal consolidation. No pneumothorax.  IMPRESSION: 1.  No acute cardiopulmonary process.  . 2. Cardiomegaly and chronic bronchitic markings.   Electronically Signed   By: Suzy Bouchard M.D.   On: 01/05/2014 14:05   Dg Femur Left  01/05/2014   CLINICAL DATA:  Left leg pain  EXAM: LEFT FEMUR - 2 VIEW  COMPARISON:  None.  FINDINGS: Mild degenerative changes of left knee joint are seen. Diffuse vascular calcifications are noted. No acute fracture or dislocation is seen.  IMPRESSION: Chronic changes without acute abnormality.   Electronically Signed   By: Inez Catalina M.D.   On: 01/05/2014 14:05   Dg Femur Right  01/05/2014   CLINICAL DATA:  Patient fell today  EXAM: RIGHT FEMUR - 2 VIEW  COMPARISON:  None.  FINDINGS: There is no evidence of fracture or other focal bone lesions. Soft tissues are unremarkable. There is peripheral vascular atherosclerotic disease.  IMPRESSION: Negative.   Electronically Signed   By: Kathreen Devoid   On: 01/05/2014 14:23   Ct Head Wo Contrast  01/05/2014   CLINICAL DATA:  Status post fall  EXAM: CT HEAD WITHOUT CONTRAST  CT MAXILLOFACIAL WITHOUT CONTRAST  CT CERVICAL SPINE WITHOUT CONTRAST  TECHNIQUE: Multidetector CT imaging of the head, cervical spine, and maxillofacial structures were performed using the standard protocol without intravenous contrast. Multiplanar CT image reconstructions of the cervical spine and maxillofacial structures were also generated.  COMPARISON:  None.  FINDINGS: CT HEAD FINDINGS  There is no evidence of mass effect, midline shift, or extra-axial fluid collections. There is no evidence of a space-occupying lesion or intracranial hemorrhage.  There is no evidence of a cortical-based area of acute infarction. There is generalized cerebral atrophy. There is periventricular white matter low attenuation likely secondary to microangiopathy.  The ventricles and sulci are appropriate for the patient's age. The basal cisterns are patent.  Visualized portions of the orbits are unremarkable. The mastoid air cells are clear. There is an air-fluid level in the right sphenoid sinus. Bilateral ethmoid sinus mucosal thickening. Cerebrovascular atherosclerotic calcifications are noted.  The osseous structures are unremarkable. Severe right facial and right periorbital soft tissue swelling and hematoma.  CT MAXILLOFACIAL FINDINGS  Severe right periorbital and right facial soft tissue swelling as well as hematoma formation. The globes are intact. The orbital walls are intact. The orbital floors are intact. The maxilla is intact. The mandible is intact. The zygomatic arches are intact. The nasal septum is midline. There is no nasal bone fracture. The temporomandibular joints are normal.  Right sphenoid sinus air-fluid level. Bilateral ethmoid sinus mucosal thickening. The visualized portions of the mastoid sinuses are well aerated.  CT CERVICAL SPINE FINDINGS  The alignment is anatomic. The vertebral body heights are maintained. There is no acute fracture. There is no static listhesis. The prevertebral soft tissues are normal. The intraspinal soft tissues are not fully imaged on this examination due to poor soft tissue contrast, but there is no gross soft tissue abnormality.  There is mild degenerative disc disease throughout the cervical spine. There are mild broad-based disc bulges at C3-4, C4-5, C5-6 and C6-7. At C3-4 there is bilateral facet arthropathy and bilateral uncovertebral degenerative change with left foraminal stenosis. At C4-5 there is bilateral facet arthropathy and uncovertebral degenerative change with bilateral foraminal stenosis, left greater than  right. At C5-6 there is left uncovertebral degenerative change.  The visualized portions of the lung apices demonstrate no focal abnormality.There is bilateral carotid artery atherosclerosis.  IMPRESSION: 1. No acute intracranial pathology. 2. No acute osseous injury of the maxillofacial bones. Severe right facial soft tissue swelling and right periorbital soft tissue swelling as well as hematoma formation. 3. No acute osseous injury of the cervical spine. 4. Cervical spine spondylosis as described above.   Electronically Signed   By: Kathreen Devoid   On: 01/05/2014 13:56   Ct Cervical Spine Wo Contrast  01/05/2014   CLINICAL DATA:  Status post fall  EXAM: CT HEAD WITHOUT CONTRAST  CT MAXILLOFACIAL WITHOUT CONTRAST  CT CERVICAL SPINE WITHOUT CONTRAST  TECHNIQUE: Multidetector CT imaging of the head, cervical spine, and maxillofacial structures were performed using the standard protocol without intravenous contrast. Multiplanar CT image reconstructions of the cervical spine and maxillofacial structures were also generated.  COMPARISON:  None.  FINDINGS: CT HEAD FINDINGS  There is no evidence of mass effect, midline shift, or extra-axial fluid collections. There is no evidence of a space-occupying lesion or intracranial hemorrhage. There is no evidence of a cortical-based area of acute infarction. There is generalized cerebral atrophy. There is periventricular white matter low attenuation likely secondary to microangiopathy.  The ventricles and sulci are appropriate for the patient's age. The basal cisterns are patent.  Visualized portions of the orbits are unremarkable. The mastoid air cells are clear. There is an air-fluid level in the right sphenoid sinus. Bilateral ethmoid sinus mucosal thickening. Cerebrovascular atherosclerotic calcifications are noted.  The osseous structures are unremarkable. Severe right facial and right periorbital soft tissue swelling and hematoma.  CT MAXILLOFACIAL FINDINGS  Severe right  periorbital and right facial soft tissue swelling as well as hematoma formation. The globes are intact. The orbital walls are intact. The orbital floors are intact. The maxilla is intact. The mandible is intact. The zygomatic arches are intact. The nasal septum is midline. There is no nasal bone fracture. The temporomandibular joints are normal.  Right sphenoid sinus air-fluid level. Bilateral ethmoid sinus mucosal thickening. The visualized portions of the mastoid sinuses are well aerated.  CT CERVICAL SPINE FINDINGS  The alignment is anatomic. The vertebral body heights are maintained. There is no acute fracture. There is no static listhesis. The prevertebral soft tissues are normal. The intraspinal soft tissues are not fully imaged on this examination due to poor soft tissue contrast, but there is no gross soft tissue abnormality.  There is mild degenerative disc disease throughout the cervical spine. There are mild broad-based disc bulges at C3-4, C4-5, C5-6 and C6-7. At C3-4 there is bilateral facet arthropathy and bilateral uncovertebral degenerative change with left foraminal stenosis. At C4-5 there is bilateral facet arthropathy and uncovertebral degenerative change with bilateral foraminal stenosis, left greater than  right. At C5-6 there is left uncovertebral degenerative change.  The visualized portions of the lung apices demonstrate no focal abnormality.There is bilateral carotid artery atherosclerosis.  IMPRESSION: 1. No acute intracranial pathology. 2. No acute osseous injury of the maxillofacial bones. Severe right facial soft tissue swelling and right periorbital soft tissue swelling as well as hematoma formation. 3. No acute osseous injury of the cervical spine. 4. Cervical spine spondylosis as described above.   Electronically Signed   By: Kathreen Devoid   On: 01/05/2014 13:56   Ct Maxillofacial Wo Cm  01/05/2014   CLINICAL DATA:  Status post fall  EXAM: CT HEAD WITHOUT CONTRAST  CT MAXILLOFACIAL  WITHOUT CONTRAST  CT CERVICAL SPINE WITHOUT CONTRAST  TECHNIQUE: Multidetector CT imaging of the head, cervical spine, and maxillofacial structures were performed using the standard protocol without intravenous contrast. Multiplanar CT image reconstructions of the cervical spine and maxillofacial structures were also generated.  COMPARISON:  None.  FINDINGS: CT HEAD FINDINGS  There is no evidence of mass effect, midline shift, or extra-axial fluid collections. There is no evidence of a space-occupying lesion or intracranial hemorrhage. There is no evidence of a cortical-based area of acute infarction. There is generalized cerebral atrophy. There is periventricular white matter low attenuation likely secondary to microangiopathy.  The ventricles and sulci are appropriate for the patient's age. The basal cisterns are patent.  Visualized portions of the orbits are unremarkable. The mastoid air cells are clear. There is an air-fluid level in the right sphenoid sinus. Bilateral ethmoid sinus mucosal thickening. Cerebrovascular atherosclerotic calcifications are noted.  The osseous structures are unremarkable. Severe right facial and right periorbital soft tissue swelling and hematoma.  CT MAXILLOFACIAL FINDINGS  Severe right periorbital and right facial soft tissue swelling as well as hematoma formation. The globes are intact. The orbital walls are intact. The orbital floors are intact. The maxilla is intact. The mandible is intact. The zygomatic arches are intact. The nasal septum is midline. There is no nasal bone fracture. The temporomandibular joints are normal.  Right sphenoid sinus air-fluid level. Bilateral ethmoid sinus mucosal thickening. The visualized portions of the mastoid sinuses are well aerated.  CT CERVICAL SPINE FINDINGS  The alignment is anatomic. The vertebral body heights are maintained. There is no acute fracture. There is no static listhesis. The prevertebral soft tissues are normal. The  intraspinal soft tissues are not fully imaged on this examination due to poor soft tissue contrast, but there is no gross soft tissue abnormality.  There is mild degenerative disc disease throughout the cervical spine. There are mild broad-based disc bulges at C3-4, C4-5, C5-6 and C6-7. At C3-4 there is bilateral facet arthropathy and bilateral uncovertebral degenerative change with left foraminal stenosis. At C4-5 there is bilateral facet arthropathy and uncovertebral degenerative change with bilateral foraminal stenosis, left greater than right. At C5-6 there is left uncovertebral degenerative change.  The visualized portions of the lung apices demonstrate no focal abnormality.There is bilateral carotid artery atherosclerosis.  IMPRESSION: 1. No acute intracranial pathology. 2. No acute osseous injury of the maxillofacial bones. Severe right facial soft tissue swelling and right periorbital soft tissue swelling as well as hematoma formation. 3. No acute osseous injury of the cervical spine. 4. Cervical spine spondylosis as described above.   Electronically Signed   By: Kathreen Devoid   On: 01/05/2014 13:56    Scheduled Meds: . atorvastatin  10 mg Oral q1800  . budesonide-formoterol  2 puff Inhalation BID  . calcium-vitamin D  1 tablet Oral BID  . cholecalciferol  2,000 Units Oral Daily  . ferrous gluconate  324 mg Oral Q breakfast  . folic acid  1 mg Oral Daily  . ipratropium-albuterol  3 mL Nebulization Q4H  . ipratropium-albuterol  3 mL Nebulization Once  . latanoprost  1 drop Both Eyes QHS  . levothyroxine  75 mcg Oral QAC breakfast  . methylPREDNISolone (SOLU-MEDROL) injection  60 mg Intravenous 4 times per day  . metoprolol succinate  100 mg Oral Daily  . omega-3 acid ethyl esters  2 g Oral Daily  . pantoprazole  40 mg Oral Daily  . phytonadione  10 mg Oral Once  . polyvinyl alcohol  1 drop Both Eyes BID  . potassium chloride  20 mEq Oral Once  . sodium chloride  3 mL Intravenous Q12H    Continuous Infusions: . diltiazem (CARDIZEM) infusion 5 mg/hr (01/06/14 1344)    Time spent: 35 minutes  Delfina Redwood, MD  [facility] Pager [phone]. If 7PM-7AM, please contact night-coverage at [email or web address], password [facility] 01/06/2014, 2:07 PM  LOS: 1 day

## 2014-01-06 NOTE — Evaluation (Signed)
Clinical/Bedside Swallow Evaluation Patient Details  Name: GLENDALE WHERRY MRN: [id number] Date of Birth: [birth year]  Today's Date: 01/06/2014 Time: 4356-8616 SLP Time Calculation (min): 19 min  Past Medical History:  Past Medical History  Diagnosis Date  . Lung cancer 09-11-2007    Dr Arlyce Dice did surgery; Dr. Inda Merlin at cancer ctr  . Allergic rhinitis   . Atrial fibrillation   . Hyperlipidemia   . Asthma   . Dysrhythmia     ATRIAL FIBRILATION   Past Surgical History:  Past Surgical History  Procedure Laterality Date  . Inguinal hernia repair      right  . Thyroid surgery      nodule resection  . Lung lobectomy  09-11-2007    right upper lobe  . Hemorroidectomy     HPI:  80 year old female with a history of chronic atrial fibrillation, non-small cell lung cancer status post right upper lobe lobectomy, COPD, hypothyroidism, and hyperlipidemia presented to ED after a mechanical fall sustained to her face. Increasing shortness of breath for the past 2-3 days.  CT of the brain was negative for any acute intracranial abnormality.  No hip fx's.  CXR 5/31 revealed No acute cardiopulmonary process.  MD note reveals concern about pulmonary infection.  Daughter reports difficulty with pills, cornbread and some coughing with liquids prior to admission.    Assessment / Plan / Recommendation Clinical Impression  Pt. with congested respirations prior to solids/liquids.  She exhibited evidence of possible pharyngeal dysphagia paired with daughter reports some difficulty at home, recommend MBS tomorrow and alter diet to Dys 3 and nectar thick liquids, pills whole in applesauce.  Educated daughter on risks of dysphagia and clinical reasoning for MBS.    Aspiration Risk  Moderate    Diet Recommendation Dysphagia 3 (Mechanical Soft);Nectar-thick liquid   Liquid Administration via: Cup;No straw Medication Administration: Whole meds with puree Supervision: Patient able to self feed;Full  supervision/cueing for compensatory strategies Compensations: Slow rate;Small sips/bites Postural Changes and/or Swallow Maneuvers: Seated upright 90 degrees    Other  Recommendations Recommended Consults: MBS Oral Care Recommendations: Oral care BID   Follow Up Recommendations   (TBD)    Frequency and Duration min 2x/week  2 weeks   Pertinent Vitals/Pain WDL         Swallow Study           Oral/Motor/Sensory Function Overall Oral Motor/Sensory Function: Appears within functional limits for tasks assessed   Ice Chips Ice chips: Impaired Pharyngeal Phase Impairments: Cough - Delayed;Decreased hyoid-laryngeal movement   Thin Liquid Thin Liquid: Impaired Presentation: Cup Pharyngeal  Phase Impairments: Decreased hyoid-laryngeal movement;Cough - Delayed    Nectar Thick Nectar Thick Liquid: Not tested   Honey Thick Honey Thick Liquid: Not tested   Puree Puree: Impaired Pharyngeal Phase Impairments: Decreased hyoid-laryngeal movement   Solid   GO    Solid: Not tested       Houston Siren M.Ed [employer] [phone]  01/06/2014

## 2014-01-07 ENCOUNTER — Inpatient Hospital Stay (HOSPITAL_COMMUNITY): Payer: Medicare Other

## 2014-01-07 DIAGNOSIS — I10 Essential (primary) hypertension: Secondary | ICD-10-CM

## 2014-01-07 DIAGNOSIS — R1310 Dysphagia, unspecified: Secondary | ICD-10-CM

## 2014-01-07 LAB — BASIC METABOLIC PANEL
BUN: 30 mg/dL — ABNORMAL HIGH (ref 6–23)
CHLORIDE: 97 meq/L (ref 96–112)
CO2: 28 meq/L (ref 19–32)
CREATININE: 0.53 mg/dL (ref 0.50–1.10)
Calcium: 8.6 mg/dL (ref 8.4–10.5)
GFR calc non Af Amer: 81 mL/min — ABNORMAL LOW (ref >90)
GLUCOSE: 157 mg/dL — AB (ref 70–99)
POTASSIUM: 3.2 meq/L — AB (ref 3.7–5.3)
Sodium: 138 mEq/L (ref 137–147)

## 2014-01-07 LAB — FOLATE RBC: RBC Folate: 566 ng/mL (ref >280)

## 2014-01-07 LAB — CBC
HCT: 29.3 % — ABNORMAL LOW (ref 36.0–46.0)
HEMOGLOBIN: 10 g/dL — AB (ref 12.0–15.0)
MCH: 30.6 pg (ref 26.0–34.0)
MCHC: 34.1 g/dL (ref 30.0–36.0)
MCV: 89.6 fL (ref 78.0–100.0)
Platelets: 70 10*3/uL — ABNORMAL LOW (ref 150–400)
RBC: 3.27 MIL/uL — ABNORMAL LOW (ref 3.87–5.11)
RDW: 13.6 % (ref 11.5–15.5)
WBC: 6.8 10*3/uL (ref 4.0–10.5)

## 2014-01-07 LAB — VITAMIN B12: Vitamin B-12: 378 pg/mL (ref 211–911)

## 2014-01-07 LAB — URINE CULTURE

## 2014-01-07 LAB — PROTIME-INR
INR: 1.58 — AB (ref 0.00–1.49)
Prothrombin Time: 18.4 seconds — ABNORMAL HIGH (ref 11.6–15.2)

## 2014-01-07 MED ORDER — METOPROLOL TARTRATE 25 MG PO TABS
25.0000 mg | ORAL_TABLET | Freq: Two times a day (BID) | ORAL | Status: DC
  Filled 2014-01-07: qty 1

## 2014-01-07 MED ORDER — PIPERACILLIN-TAZOBACTAM 3.375 G IVPB
3.3750 g | Freq: Three times a day (TID) | INTRAVENOUS | Status: DC
  Administered 2014-01-07 – 2014-01-13 (×17): 3.375 g via INTRAVENOUS
  Filled 2014-01-07 (×20): qty 50

## 2014-01-07 MED ORDER — LORAZEPAM 2 MG/ML IJ SOLN
1.0000 mg | Freq: Once | INTRAMUSCULAR | Status: DC
  Filled 2014-01-07: qty 1

## 2014-01-07 MED ORDER — PREDNISONE 20 MG PO TABS
40.0000 mg | ORAL_TABLET | Freq: Every day | ORAL | Status: DC
  Administered 2014-01-08 – 2014-01-13 (×6): 40 mg via ORAL
  Filled 2014-01-07 (×7): qty 2

## 2014-01-07 MED ORDER — METOPROLOL TARTRATE 12.5 MG HALF TABLET
12.5000 mg | ORAL_TABLET | Freq: Two times a day (BID) | ORAL | Status: DC
  Administered 2014-01-07 – 2014-01-08 (×3): 12.5 mg via ORAL
  Filled 2014-01-07 (×5): qty 1

## 2014-01-07 MED ORDER — POTASSIUM CHLORIDE 20 MEQ/15ML (10%) PO LIQD
40.0000 meq | Freq: Once | ORAL | Status: AC
  Administered 2014-01-07: 40 meq via ORAL
  Filled 2014-01-07: qty 30

## 2014-01-07 NOTE — Progress Notes (Signed)
concerned of pts AMS; yesterday pt was alert and oriented X4; pt lived at home independently and drove independently prior to this admission; today pt is alert to self only, pt is unable to recall daughter or granddaughters name, granddaughter is at bedside; paged MD to notify of change; MD has ordered an MRI.  Carollee Sires, RN

## 2014-01-07 NOTE — Care Management Note (Signed)
    Page 1 of 1   01/13/2014     1:56:54 PM CARE MANAGEMENT NOTE 01/13/2014  Patient:  AMBRIELLA, KITT   Account Number:  [id number]  Date Initiated:  01/07/2014  Documentation initiated by:  Jaylun Fleener  Subjective/Objective Assessment:   Pt adm on 01/05/14 with fall, facial lac, bruising, fever. PTA, pt lives alone.     Action/Plan:   PT eval pending.  May need SNF, as now having confusion at intervals.  Will follow progress.   Anticipated DC Date:  01/13/2014   Anticipated DC Plan:  SKILLED NURSING FACILITY  In-house referral  Clinical Social Worker      DC Planning Services  CM consult      Choice offered to / List presented to:             Status of service:  Completed, signed off Medicare Important Message given?  YES (If response is "NO", the following Medicare IM given date fields will be blank) Date Medicare IM given:   Date Additional Medicare IM given:  01/13/2014  Discharge Disposition:  SKILLED NURSING FACILITY  Per UR Regulation:  Reviewed for med. necessity/level of care/duration of stay  If discussed at [address] of Stay Meetings, dates discussed:    Comments:  01/13/14 Ellan Lambert, RN, BSN [phone] Pt discharged to SNF today, per CSW arrangements.

## 2014-01-07 NOTE — Procedures (Signed)
Objective Swallowing Evaluation: Modified Barium Swallowing Study  Patient Details  Name: Erica [name] MRN: [id number] Date of Birth: 09-Mar-1921  Today's Date: 01/07/2014 Time: 4098-1191 SLP Time Calculation (min): 30 min  Past Medical History:  Past Medical History  Diagnosis Date  . Lung cancer 09-11-2007    Dr Arlyce Dice did surgery; Dr. Inda Merlin at cancer [facility]  . Allergic rhinitis   . Atrial fibrillation   . Hyperlipidemia   . Asthma   . Dysrhythmia     ATRIAL FIBRILATION   Past Surgical History:  Past Surgical History  Procedure Laterality Date  . Inguinal hernia repair      right  . Thyroid surgery      nodule resection  . Lung lobectomy  09-11-2007    right upper lobe  . Hemorroidectomy     HPI:  80 year old female with a history of chronic atrial fibrillation, non-small cell lung cancer status post right upper lobe lobectomy, COPD, hypothyroidism, and hyperlipidemia presented to ED after a mechanical fall sustained to her face. Increasing shortness of breath for the past 2-3 days.  CT of the brain was negative for any acute intracranial abnormality.  No hip fx's.  CXR 5/31 revealed No acute cardiopulmonary process.  MD note reveals concern about pulmonary infection.  Daughter reports difficulty with pills, cornbread and some coughing with liquids prior to admission.      Assessment / Plan / Recommendation Clinical Impression  Dysphagia Diagnosis: Moderate oral phase dysphagia;Severe cervical esophageal phase dysphagia;Mild pharyngeal phase dysphagia   Clinical impression:   Moderate oral, mild pharyngeal and severe cervical esophageal dysphagia without aspiration of any consistency tested.  Oral deficits characterized by weakness/discoordination resulting in lingual pumping, delays in transiting and excessive "mastication" even after bolus cleared.  Pharyngeal dysphagia characterized by minimal delay swallow reflex and mild to moderate stasis without pt awareness.    Pt 's  cervical esophageal dysphagia characterized by appearance of Zenker's diverticulum that fills with boluses (worse with solids)  resulting in trace backflow of liquids into pharynx.  Suspect this is pt's primary source of dysphagia symptoms as pt with poor awareness/sensation to stasis/backflow.  Cued dry swallows when pt able to elicit aided clearance.    Using live video, SlP provided pt with education and reinforced effective compensation strategies. Recommend puree/thin diet with strict aspiration precautions/compensation strategies.  Daughter Arville Go present for MBS and SlP educated her that pt likely has component of chronic dysphagia/asp risk - role of SLP is mitigation.    Recommend puree/thin with strict precautions.  SLP to follow for tolerance, family/pt education.     Treatment Recommendation  Therapy as outlined in treatment plan below    Diet Recommendation Dysphagia 1 (Puree);Thin liquid   Liquid Administration via: Cup Medication Administration: Crushed with puree Supervision: Patient able to self feed;Full supervision/cueing for compensatory strategies Compensations: Slow rate;Small sips/bites;Multiple dry swallows after each bite/sip;Follow solids with liquid Postural Changes and/or Swallow Maneuvers: Seated upright 90 degrees;Upright 30-60 min after meal    Other  Recommendations Oral Care Recommendations: Oral care BID   Follow Up Recommendations    TBD   Frequency and Duration min 2x/week  2 weeks     General Date of Onset: 01/07/14 HPI: 80 year old female with a history of chronic atrial fibrillation, non-small cell lung cancer status post right upper lobe lobectomy, COPD, hypothyroidism, and hyperlipidemia presented to ED after a mechanical fall sustained to her face. Increasing shortness of breath for the past 2-3 days.  CT of the brain  was negative for any acute intracranial abnormality.  No hip fx's.  CXR 5/31 revealed No acute cardiopulmonary process.  MD note  reveals concern about pulmonary infection.  Daughter reports difficulty with pills, cornbread and some coughing with liquids prior to admission.  Type of Study: Modified Barium Swallowing Study Reason for Referral: Objectively evaluate swallowing function Previous Swallow Assessment:  (none) Diet Prior to this Study: Dysphagia 3 (soft);Nectar-thick liquids Temperature Spikes Noted: No Respiratory Status: Nasal cannula History of Recent Intubation: No Behavior/Cognition: Alert;Cooperative Oral Cavity - Dentition: Edentulous Oral Motor / Sensory Function: Impaired - see Bedside swallow eval Self-Feeding Abilities: Able to feed self;Needs set up;Total assist Patient Positioning: Upright in chair Baseline Vocal Quality: Clear Volitional Cough: Weak Volitional Swallow: Able to elicit (with delay) Anatomy:  (appearance of Zenker's diverticulum) Pharyngeal Secretions: Standing secretions in (comment) (pharynx mixing with secretions, further intake clears)    Reason for Referral Objectively evaluate swallowing function   Oral Phase Oral Preparation/Oral Phase Oral Phase: Impaired Oral - Nectar Oral - Nectar Cup: Piecemeal swallowing;Reduced posterior propulsion;Lingual pumping;Delayed oral transit;Lingual/palatal residue Oral - Thin Oral - Thin Teaspoon: Lingual pumping;Reduced posterior propulsion;Piecemeal swallowing;Delayed oral transit;Lingual/palatal residue Oral - Thin Cup: Reduced posterior propulsion;Lingual/palatal residue;Piecemeal swallowing;Delayed oral transit;Lingual pumping Oral - Thin Straw: Lingual pumping;Reduced posterior propulsion;Piecemeal swallowing;Lingual/palatal residue Oral - Solids Oral - Puree: Reduced posterior propulsion;Lingual pumping;Piecemeal swallowing;Lingual/palatal residue;Delayed oral transit Oral - Mechanical Soft: Lingual/palatal residue;Piecemeal swallowing;Reduced posterior propulsion;Lingual pumping;Delayed oral transit (pt expectorated partially  masticated cereal bar per SlP verbal/visual cue) Oral Phase - Comment Oral Phase - Comment: pt presents with excessive "mastication" even after completing swallows of solids - daughter reports this to be baseline phenomenon, cues to dry swallow effective when pt able to elicit (approx 42% opportunities)   Pharyngeal Phase Pharyngeal Phase Pharyngeal Phase: Impaired Pharyngeal - Nectar Pharyngeal - Nectar Teaspoon: Reduced pharyngeal peristalsis;Pharyngeal residue - pyriform sinuses;Pharyngeal residue - valleculae;Premature spillage to valleculae Pharyngeal - Nectar Cup: Pharyngeal residue - valleculae;Pharyngeal residue - pyriform sinuses;Pharyngeal residue - cp segment;Premature spillage to valleculae Pharyngeal - Thin Pharyngeal - Thin Teaspoon: Pharyngeal residue - valleculae;Pharyngeal residue - pyriform sinuses;Pharyngeal residue - cp segment;Lateral channel residue;Premature spillage to valleculae Pharyngeal - Thin Cup: Lateral channel residue;Pharyngeal residue - pyriform sinuses;Pharyngeal residue - valleculae;Pharyngeal residue - cp segment;Premature spillage to valleculae Pharyngeal - Thin Straw: Pharyngeal residue - cp segment;Pharyngeal residue - valleculae;Pharyngeal residue - pyriform sinuses;Lateral channel residue;Premature spillage to valleculae Pharyngeal - Solids Pharyngeal - Puree: Delayed swallow initiation;Premature spillage to valleculae;Pharyngeal residue - valleculae Pharyngeal - Mechanical Soft: Delayed swallow initiation;Premature spillage to valleculae;Pharyngeal residue - valleculae Pharyngeal Phase - Comment Pharyngeal Comment: pt benefited from following solids with liquids to faciliate pharyngeal clearance  Cervical Esophageal Phase    GO    Cervical Esophageal Phase Cervical Esophageal Phase: Impaired Cervical Esophageal Phase - Nectar Nectar Cup: Reduced cricopharyngeal relaxation Cervical Esophageal Phase - Thin Thin Teaspoon: Reduced cricopharyngeal  relaxation Thin Cup: Reduced cricopharyngeal relaxation;Esophageal backflow into the pharynx Thin Straw: Reduced cricopharyngeal relaxation;Esophageal backflow into the pharynx Cervical Esophageal Phase - Solids Puree: Reduced cricopharyngeal relaxation Mechanical Soft: Reduced cricopharyngeal relaxation Cervical Esophageal Phase - Comment Cervical Esophageal Comment: liquid boluses flow around what appears to be Zenker's diverticulum - radiologist not present to confirm findings, did not provide pt with barium tablet due to concern for it to become lodged in Zenker's diverticulum, appearance of slow clearance throughout esophagus with ? tertiary contractions, liquids facilitate clearance         Luanna Salk, [place] [facility] SLP [phone]

## 2014-01-07 NOTE — Progress Notes (Signed)
Occupational Therapy Evaluation Patient Details Name: Erica [name] MRN: [id number] DOB: 1921/07/22 Today's Date: 01/07/2014    History of Present Illness Pt admitted after fall at home with significant right sided face and thigh hematoma without fx. Pt stayed down 4 hrs until family arrived to visit because she didnt' want to call and bother anyone despite presence of life alert. CT of head negative and all xrays of hip and face negative for fx Pt with hx of non-small cell lung cancer status post right upper lobe lobectomy, COPD, hypothyroidism, and hyperlipidemia   Clinical Impression   PT admitted with s/p fall with TBI CHI now with pending MRI workup. Pt currently with functional limitiations due to the deficits listed below (see OT problem list). PTA driving independent living at home alone. Pt will benefit from skilled OT to increase their independence and safety with adls and balance to allow discharge Cir.     Follow Up Recommendations  CIR    Equipment Recommendations  Other (comment) (defer to CIR)    Recommendations for Other Services Rehab consult     Precautions / Restrictions Precautions Precautions: Fall Precaution Comments: Rt eye patch ( visual changes noted) watch oxygen saturations      Mobility Bed Mobility Overal bed mobility: Needs Assistance Bed Mobility: Supine to Sit     Supine to sit: Min assist     General bed mobility comments: assist for direction of transfer as pt needed visual and tactile cueing to bring legs off the bed  Transfers Overall transfer level: Needs assistance   Transfers: Sit to/from Stand Sit to Stand: Min assist         General transfer comment: Pt long sitting in chair total (A) poor sitting tolerance    Balance Overall balance assessment: Needs assistance Sitting-balance support: No upper extremity supported;Feet supported Sitting balance-Leahy Scale: Zero       Standing balance-Leahy Scale: Poor                              ADL Overall ADL's : Needs assistance/impaired                                       General ADL Comments: pt currently requires total (A) for all adls due to decr arousal and cognitive deficits     Vision                 Additional Comments: Pt unable to focus or sustain attention to visual assessment Ot to further evaluate next session   Perception     Praxis      Pertinent Vitals/Pain Requires oxygen     Hand Dominance Right   Extremity/Trunk Assessment Upper Extremity Assessment Upper Extremity Assessment: Generalized weakness;Difficult to assess due to impaired cognition   Lower Extremity Assessment Lower Extremity Assessment: Defer to PT evaluation   Cervical / Trunk Assessment Cervical / Trunk Assessment: Normal   Communication Communication Communication: HOH   Cognition Arousal/Alertness: Awake/alert Behavior During Therapy:  (decr arousal) Overall Cognitive Status: Impaired/Different from baseline Area of Impairment: Orientation;Attention;Memory;Following commands Orientation Level: Disoriented to;Person;Time;Place;Situation Current Attention Level:  (aroused) Memory: Decreased short-term memory Following Commands: Follows one step commands inconsistently Safety/Judgement: Decreased awareness of deficits;Decreased awareness of safety   Problem Solving: Slow processing;Decreased initiation;Difficulty sequencing;Requires verbal cues;Requires tactile cues General Comments: Pt aroused for 30 seconds max  at a time. pt needed constant change of position or tactile input to arouse. pt reports her name is not [place] when provided cues. pt reported birthday correctly. All other information was incorrect. Pt did not recognize granddaughter. Pt provided two names to choose from and unable to report granddaughters name. pt reporting name to be Mel Almond which is great granddaughters name not present. Pt responds "yeah" to all  questions even when yes no responses are not appropriate.   General Comments       Exercises       Shoulder Instructions      Home Living Family/patient expects to be discharged to:: Private residence Living Arrangements: Alone Available Help at Discharge: Family;Available PRN/intermittently Type of Home: House Home Access: Level entry     Home Layout: One level         Bathroom Toilet: Standard     Home Equipment: None   Additional Comments: drives and fixes sunday dinner for the whole family      Prior Functioning/Environment Level of Independence: Independent             OT Diagnosis: Generalized weakness;Cognitive deficits;Blindness and low vision   OT Problem List: Decreased strength;Impaired balance (sitting and/or standing);Decreased activity tolerance;Decreased cognition;Decreased safety awareness;Decreased knowledge of use of DME or AE;Decreased knowledge of precautions;Impaired vision/perception   OT Treatment/Interventions: Self-care/ADL training;Therapeutic exercise;DME and/or AE instruction;Therapeutic activities;Cognitive remediation/compensation;Visual/perceptual remediation/compensation;Patient/family education;Balance training    OT Goals(Current goals can be found in the care plan section) Acute Rehab OT Goals Patient Stated Goal: unable to participate OT Goal Formulation: With patient Time For Goal Achievement: 01/21/14 Potential to Achieve Goals: Good  OT Frequency: Min 2X/week   Barriers to D/C:            Co-evaluation              End of Session Nurse Communication: Mobility status;Precautions  Activity Tolerance: Patient limited by fatigue;Patient limited by lethargy Patient left: in chair;with call bell/phone within reach;with family/visitor present   Time: 5366-4403 OT Time Calculation (min): 14 min Charges:  OT General Charges $OT Visit: 1 Procedure OT Evaluation $Initial OT Evaluation Tier I: 1 Procedure OT  Treatments $Cognitive Skills Development: 8-22 mins G-Codes:    Peri Maris 01-12-2014, 3:36 PM Pager: [phone]

## 2014-01-07 NOTE — Progress Notes (Signed)
PT Cancellation Note  Patient Details Name: Erica [name] MRN: [id number] DOB: 09-Nov-1920   Cancelled Treatment:    Reason Eval/Treat Not Completed: Patient at procedure or test/unavailable (Pt OOR for MBSS and awaiting potassium repletion currently 3.2)   Erica Navarez [name] 01/07/2014, 9:06 AM Elwyn Reach, [place]

## 2014-01-07 NOTE — Evaluation (Signed)
Physical Therapy Evaluation Patient Details Name: Erica [name] MRN: [id number] DOB: 15-Aug-1920 Today's Date: 01/07/2014   History of Present Illness  Pt admitted after fall at home with significant right sided face and thigh hematoma without fx. Pt stayed down 4 hrs until family arrived to visit because she didnt' want to call and bother anyone despite presence of life alert. CT of head negative and all xrays of hip and face negative for fx Pt with hx of non-small cell lung cancer status post right upper lobe lobectomy, COPD, hypothyroidism, and hyperlipidemia   Clinical Impression  Pt very pleasant but significantly confused. Pt not oriented to anything other than self. Pt unable to recall her dgtr or granddaughter or how many children/grandchildren she has. When asking pt about vision she can correctly identify colors but reports that P.T. Has 4 eyes and 3 noses while granddgtr has 10 eyes and 7 noses, placed pt in front of mirror and she reported seeing a "mean boy". Granddgtr present throughout all of this and stating that Ms.Humann was truly independent before the fall caring for herself and fixing a family meal every Sunday and could always recall all family names. Family report noticing cognitive change last night on 01/06/14 and that pt was fine right after the fall. Rn notified of all the above as well as OT for further visual assessment.   Pt demonstrates decreased balance, mobility, cognition and vision who will benefit from acute therapy to maximize mobility, function and activity to decrease burden of care and increase quality of life. Pending further imaging and therapy feel pt would benefit from CIR at DC to maximize function. Family educated for plan and feel that pt may be able to live with dgtr after all necessary therapy received.     Follow Up Recommendations CIR;Supervision/Assistance - 24 hour    Equipment Recommendations  Rolling walker with 5" wheels    Recommendations for  Other Services OT consult;Rehab consult     Precautions / Restrictions Precautions Precautions: Fall Precaution Comments: right eye patch, pt with visual morphology, watch sats      Mobility  Bed Mobility Overal bed mobility: Needs Assistance Bed Mobility: Supine to Sit     Supine to sit: Min assist     General bed mobility comments: assist for direction of transfer as pt needed visual and tactile cueing to bring legs off the bed  Transfers Overall transfer level: Needs assistance   Transfers: Sit to/from Stand Sit to Stand: Min assist         General transfer comment: physical assist for hand placement, anterior translation and balance  Ambulation/Gait Ambulation/Gait assistance: Mod assist Ambulation Distance (Feet): 15 Feet Assistive device: 1 person hand held assist Gait Pattern/deviations: Step-through pattern;Decreased stride length;Narrow base of support   Gait velocity interpretation: Below normal speed for age/gender General Gait Details: pt with posterior bias in standing with assist to prevent fall and maintain balance with multimodal cues for direction, safety and balance, walked to sink and pt pushing away from sink after seeing "mean boy" in Hydrologist    Modified Rankin (Stroke Patients Only)       Balance Overall balance assessment: Needs assistance   Sitting balance-Leahy Scale: Fair       Standing balance-Leahy Scale: Poor  Pertinent Vitals/Pain Pt reports no pain HR 77-79 sats 99% on 2L, on RA sats dropped to 86%    Home Living Family/patient expects to be discharged to:: Private residence Living Arrangements: Alone Available Help at Discharge: Family;Available PRN/intermittently Type of Home: House Home Access: Level entry     Home Layout: One level Home Equipment: None      Prior Function Level of Independence: Independent                Hand Dominance        Extremity/Trunk Assessment   Upper Extremity Assessment: Defer to OT evaluation           Lower Extremity Assessment: Generalized weakness      Cervical / Trunk Assessment: Normal  Communication   Communication: HOH  Cognition Arousal/Alertness: Awake/alert Behavior During Therapy: WFL for tasks assessed/performed Overall Cognitive Status: Impaired/Different from baseline Area of Impairment: Orientation;Attention;Memory;Following commands;Safety/judgement;Awareness;Problem solving Orientation Level: Disoriented to;Place;Time;Situation Current Attention Level: Focused Memory: Decreased short-term memory Following Commands: Follows one step commands inconsistently Safety/Judgement: Decreased awareness of deficits;Decreased awareness of safety   Problem Solving: Slow processing;Decreased initiation;Difficulty sequencing;Requires verbal cues;Requires tactile cues General Comments: Pt unaware of granddgtr present, could not recall that she has a dgtr. Pt with tactile cues for transfer OOB because with verbal she was going to opposite direction of instruction. Pt describing visual morphology of P.T. having 4 eyes and 3 noses    General Comments      Exercises        Assessment/Plan    PT Assessment Patient needs continued PT services  PT Diagnosis Difficulty walking;Altered mental status   PT Problem List Decreased strength;Decreased cognition;Decreased activity tolerance;Decreased safety awareness;Decreased skin integrity;Decreased knowledge of use of DME;Decreased balance;Decreased mobility;Decreased coordination  PT Treatment Interventions DME instruction;Gait training;Functional mobility training;Therapeutic activities;Therapeutic exercise;Patient/family education;Cognitive remediation;Balance training   PT Goals (Current goals can be found in the Care Plan section)      Frequency Min 3X/week   Barriers to discharge Decreased caregiver  support      Co-evaluation               End of Session Equipment Utilized During Treatment: Gait belt;Oxygen Activity Tolerance: Patient limited by fatigue Patient left: in chair;with call bell/phone within reach;with chair alarm set;with family/visitor present Nurse Communication: Mobility status;Precautions         Time:  -      Charges:         PT G Codes:          Francy Mcilvaine B Cassey Bacigalupo 01/07/2014, 2:41 PM Elwyn Reach, [place]

## 2014-01-07 NOTE — Progress Notes (Signed)
Rehab Admissions Coordinator Note:  Patient was screened by Cleatrice Burke for appropriateness for an Inpatient Acute Rehab Consult per PT recommendation.  At this time, we are recommending [name]. [employer] Medicare will not approve an inpt rehab admission for current diagnosis.  [name] [facility] 01/07/2014, 3:33 PM  I can be reached at [phone].

## 2014-01-07 NOTE — Progress Notes (Signed)
ANTIBIOTIC CONSULT NOTE - INITIAL  Pharmacy Consult for zosyn Indication: aspiration PNA  No Known Allergies  Patient Measurements: Height: 5\' 9"  (175.3 cm) Weight: 123 lb 0.3 oz (55.8 kg) IBW/kg (Calculated) : 66.2   Vital Signs: Temp: 97.6 F (36.4 C) (06/02 0548) Temp src: Oral (06/02 0548) BP: 112/72 mmHg (06/02 0548) Pulse Rate: 84 (06/02 0548) Intake/Output from previous day: 06/01 0701 - 06/02 0700 In: 590 [P.O.:590] Out: 1350 [Urine:1350] Intake/Output from this shift: Total I/O In: -  Out: 300 [Urine:300]  Labs:  Recent Labs  01/05/14 1251 01/06/14 0017 01/07/14 0400  WBC 3.6* 3.7* 6.8  HGB 12.9 11.0* 10.0*  PLT 65* 60* 70*  CREATININE 0.41* 0.48* 0.53   Estimated Creatinine Clearance: 41.2 ml/min (by C-G formula based on Cr of 0.53). No results found for this basename: VANCOTROUGH, VANCOPEAK, VANCORANDOM, [place], [place], GENTRANDOM, TOBRATROUGH, TOBRAPEAK, TOBRARND, AMIKACINPEAK, AMIKACINTROU, AMIKACIN,  in the last 72 hours   Microbiology: Recent Results (from the past 720 hour(s))  CULTURE, BLOOD (ROUTINE X 2)     Status: None   Collection Time    01/05/14  8:43 PM      Result Value Ref Range Status   Specimen Description BLOOD LEFT HAND   Final   Special Requests BOTTLES DRAWN AEROBIC ONLY 5CC   Final   Culture  Setup Time     Final   Value: 01/06/2014 09:09     Performed at [facility]   Culture     Final   Value:        BLOOD CULTURE RECEIVED NO GROWTH TO DATE CULTURE WILL BE HELD FOR 5 DAYS BEFORE ISSUING A FINAL NEGATIVE REPORT     Performed at [facility]   Report Status PENDING   Incomplete  CULTURE, BLOOD (ROUTINE X 2)     Status: None   Collection Time    01/05/14  9:50 PM      Result Value Ref Range Status   Specimen Description BLOOD RIGHT ARM   Final   Special Requests BOTTLES DRAWN AEROBIC AND ANAEROBIC 10CC EACH   Final   Culture  Setup Time     Final   Value: 01/06/2014 09:10     Performed at [facility]   Culture     Final   Value:        BLOOD CULTURE RECEIVED NO GROWTH TO DATE CULTURE WILL BE HELD FOR 5 DAYS BEFORE ISSUING A FINAL NEGATIVE REPORT     Performed at [facility]   Report Status PENDING   Incomplete  URINE CULTURE     Status: None   Collection Time    01/06/14  7:00 AM      Result Value Ref Range Status   Specimen Description URINE, CLEAN CATCH   Final   Special Requests NONE   Final   Culture  Setup Time     Final   Value: 01/06/2014 12:53     Performed at [place] [facility]     Final   Value: 60,000 COLONIES/ML     Performed at [facility]   Culture     Final   Value: Multiple bacterial morphotypes present, none predominant. Suggest appropriate recollection if clinically indicated.     Performed at [facility]   Report Status 01/07/2014 FINAL   Final    Medical History: Past Medical History  Diagnosis Date  . Lung cancer 09-11-2007    Dr  Burney did surgery; Dr. Inda Merlin at [place]  . Allergic rhinitis   . Atrial fibrillation   . Hyperlipidemia   . Asthma   . Dysrhythmia     ATRIAL FIBRILATION    Assessment: Patient is a 80 y.o F admitted s/p fall with facial and right thigh hematoma.  Unasyn d/ced yesterday.  To start zosyn today for suspected aspiration PNA. Scr 0.53 (crcl~41)   Plan:  1) zosyn 3.375gm IV q8h (infuse over 4 hours)  Darleen Moffitt P Mileydi Milsap 01/07/2014,1:13 PM

## 2014-01-07 NOTE — Progress Notes (Addendum)
[facility] PROGRESS NOTE  NIKESHIA KEETCH YHC:[id number] DOB: 1920-08-12 DOA: 01/05/2014 PCP: Redge Gainer, MD  Assessment/Plan:   Atrial fibrillation with RVR:  -resume PO BB -d/c gtt  Patient is not a candidate for Coumadin any longer, given the severity of her fall, advanced age.  Also, her platelet count is 60,000!  Give another dose of vitamin K, as she is at risk for continued bleeding.   AMS -? From infection -?CVA- MRI ordered    Fever, unspecified:  -?PNA ABX -?aspiration- change to zosyn  Dysphagia -diet dys 1    Hypothyroidism:  TSH normal    COPD exacerbation:  wean steroids and bronchodilators.    Facial hematoma:  No fractures.    Hypokalemia:  Replete IV    Thrombocytopenia, unspecified:  -B12 and folate pending    Hematoma of right thigh    Cough: See above    Essential hypertension, benign: Blood pressure borderline low currently. See above.    Fall at home: Will order PT OT. I doubt patient is safe to live alone. She may go home to live with her daughter vs SNF.  Elevated pro BNP: Chest x-ray showed no CHF. No history of CHF. Await echocardiogram. Blood pressure would not tolerate Lasix at this time.  Code Status:  DO NOT RESUSCITATE Family Communication:  Daughter at bedside Disposition Plan:  ?  Consultants:    Procedures:       HPI/Subjective: Per daughter before admission was driving and paying her own bills  Objective: Filed Vitals:   01/07/14 0548  BP: 112/72  Pulse: 84  Temp: 97.6 F (36.4 C)  Resp:     Intake/Output Summary (Last 24 hours) at 01/07/14 1210 Last data filed at 01/07/14 0736  Gross per 24 hour  Intake    350 ml  Output   1100 ml  Net   -750 ml   Filed Weights   01/05/14 1244 01/05/14 1750  Weight: 61.236 kg (135 lb) 55.8 kg (123 lb 0.3 oz)   Telemetry: Atrial fibrillation rate about 100  Exam:   General:  Elderly frail female alert oriented and smiling.  HEENT: Bandage over her  right thigh. Massive right facial and periorbita full hematoma with conjunctival hemorrhage. Visual Acuity normal, and extraocular movements intact.  Cardiovascular: Irregularly irregular without murmurs gallops rubs  Respiratory: Rhonchi and please and prolonged expiratory phase with tachypnea  Abdomen: Soft nontender nondistended  Ext: Large right thigh hematoma no edema peripherally   Basic Metabolic Panel:  Recent Labs Lab 01/05/14 1251 01/06/14 0017 01/07/14 0400  NA 141 142 138  K 3.3* 2.6* 3.2*  CL 100 100 97  CO2 27 27 28   GLUCOSE 122* 137* 157*  BUN 21 21 30*  CREATININE 0.41* 0.48* 0.53  CALCIUM 8.5 8.1* 8.6  MG  --  1.7  --    Liver Function Tests: No results found for this basename: AST, ALT, ALKPHOS, BILITOT, PROT, ALBUMIN,  in the last 168 hours No results found for this basename: LIPASE, AMYLASE,  in the last 168 hours No results found for this basename: AMMONIA,  in the last 168 hours CBC:  Recent Labs Lab 01/05/14 1251 01/06/14 0017 01/07/14 0400  WBC 3.6* 3.7* 6.8  HGB 12.9 11.0* 10.0*  HCT 39.3 32.0* 29.3*  MCV 91.6 91.2 89.6  PLT 65* 60* 70*   Cardiac Enzymes:  Recent Labs Lab 01/05/14 2023 01/06/14 0017 01/06/14 0907  TROPONINI <0.30 <0.30 <0.30   BNP (last 3 results)  Recent Labs  01/05/14 2023  PROBNP 4533.0*   CBG: No results found for this basename: GLUCAP,  in the last 168 hours  Recent Results (from the past 240 hour(s))  CULTURE, BLOOD (ROUTINE X 2)     Status: None   Collection Time    01/05/14  8:43 PM      Result Value Ref Range Status   Specimen Description BLOOD LEFT HAND   Final   Special Requests BOTTLES DRAWN AEROBIC ONLY 5CC   Final   Culture  Setup Time     Final   Value: 01/06/2014 09:09     Performed at [facility]   Culture     Final   Value:        BLOOD CULTURE RECEIVED NO GROWTH TO DATE CULTURE WILL BE HELD FOR 5 DAYS BEFORE ISSUING A FINAL NEGATIVE REPORT     Performed at [facility]   Report Status PENDING   Incomplete  CULTURE, BLOOD (ROUTINE X 2)     Status: None   Collection Time    01/05/14  9:50 PM      Result Value Ref Range Status   Specimen Description BLOOD RIGHT ARM   Final   Special Requests BOTTLES DRAWN AEROBIC AND ANAEROBIC 10CC EACH   Final   Culture  Setup Time     Final   Value: 01/06/2014 09:10     Performed at [facility]   Culture     Final   Value:        BLOOD CULTURE RECEIVED NO GROWTH TO DATE CULTURE WILL BE HELD FOR 5 DAYS BEFORE ISSUING A FINAL NEGATIVE REPORT     Performed at [facility]   Report Status PENDING   Incomplete  URINE CULTURE     Status: None   Collection Time    01/06/14  7:00 AM      Result Value Ref Range Status   Specimen Description URINE, CLEAN CATCH   Final   Special Requests NONE   Final   Culture  Setup Time     Final   Value: 01/06/2014 12:53     Performed at [place]     Final   Value: 60,000 COLONIES/ML     Performed at [facility]   Culture     Final   Value: Multiple bacterial morphotypes present, none predominant. Suggest appropriate recollection if clinically indicated.     Performed at [facility]   Report Status 01/07/2014 FINAL   Final     Studies: Dg Chest 1 View  01/05/2014   CLINICAL DATA:  Lung cancer, asthma  EXAM: CHEST - 1 VIEW  COMPARISON:  CT 10/21/2013  FINDINGS: Cardiac silhouette is enlarged but unchanged. Large hiatal hernia noted. Chronic bronchitic markings noted within the lungs. No focal consolidation. No pneumothorax.  IMPRESSION: 1.  No acute cardiopulmonary process.  . 2. Cardiomegaly and chronic bronchitic markings.   Electronically Signed   By: Suzy Bouchard M.D.   On: 01/05/2014 14:05   Dg Femur Left  01/05/2014   CLINICAL DATA:  Left leg pain  EXAM: LEFT FEMUR - 2 VIEW  COMPARISON:  None.  FINDINGS: Mild degenerative changes of left knee joint are seen. Diffuse vascular calcifications are noted. No  acute fracture or dislocation is seen.  IMPRESSION: Chronic changes without acute abnormality.   Electronically Signed   By: Inez Catalina M.D.   On: 01/05/2014  14:05   Dg Femur Right  01/05/2014   CLINICAL DATA:  Patient fell today  EXAM: RIGHT FEMUR - 2 VIEW  COMPARISON:  None.  FINDINGS: There is no evidence of fracture or other focal bone lesions. Soft tissues are unremarkable. There is peripheral vascular atherosclerotic disease.  IMPRESSION: Negative.   Electronically Signed   By: Kathreen Devoid   On: 01/05/2014 14:23   Ct Head Wo Contrast  01/05/2014   CLINICAL DATA:  Status post fall  EXAM: CT HEAD WITHOUT CONTRAST  CT MAXILLOFACIAL WITHOUT CONTRAST  CT CERVICAL SPINE WITHOUT CONTRAST  TECHNIQUE: Multidetector CT imaging of the head, cervical spine, and maxillofacial structures were performed using the standard protocol without intravenous contrast. Multiplanar CT image reconstructions of the cervical spine and maxillofacial structures were also generated.  COMPARISON:  None.  FINDINGS: CT HEAD FINDINGS  There is no evidence of mass effect, midline shift, or extra-axial fluid collections. There is no evidence of a space-occupying lesion or intracranial hemorrhage. There is no evidence of a cortical-based area of acute infarction. There is generalized cerebral atrophy. There is periventricular white matter low attenuation likely secondary to microangiopathy.  The ventricles and sulci are appropriate for the patient's age. The basal cisterns are patent.  Visualized portions of the orbits are unremarkable. The mastoid air cells are clear. There is an air-fluid level in the right sphenoid sinus. Bilateral ethmoid sinus mucosal thickening. Cerebrovascular atherosclerotic calcifications are noted.  The osseous structures are unremarkable. Severe right facial and right periorbital soft tissue swelling and hematoma.  CT MAXILLOFACIAL FINDINGS  Severe right periorbital and right facial soft tissue swelling as well  as hematoma formation. The globes are intact. The orbital walls are intact. The orbital floors are intact. The maxilla is intact. The mandible is intact. The zygomatic arches are intact. The nasal septum is midline. There is no nasal bone fracture. The temporomandibular joints are normal.  Right sphenoid sinus air-fluid level. Bilateral ethmoid sinus mucosal thickening. The visualized portions of the mastoid sinuses are well aerated.  CT CERVICAL SPINE FINDINGS  The alignment is anatomic. The vertebral body heights are maintained. There is no acute fracture. There is no static listhesis. The prevertebral soft tissues are normal. The intraspinal soft tissues are not fully imaged on this examination due to poor soft tissue contrast, but there is no gross soft tissue abnormality.  There is mild degenerative disc disease throughout the cervical spine. There are mild broad-based disc bulges at C3-4, C4-5, C5-6 and C6-7. At C3-4 there is bilateral facet arthropathy and bilateral uncovertebral degenerative change with left foraminal stenosis. At C4-5 there is bilateral facet arthropathy and uncovertebral degenerative change with bilateral foraminal stenosis, left greater than right. At C5-6 there is left uncovertebral degenerative change.  The visualized portions of the lung apices demonstrate no focal abnormality.There is bilateral carotid artery atherosclerosis.  IMPRESSION: 1. No acute intracranial pathology. 2. No acute osseous injury of the maxillofacial bones. Severe right facial soft tissue swelling and right periorbital soft tissue swelling as well as hematoma formation. 3. No acute osseous injury of the cervical spine. 4. Cervical spine spondylosis as described above.   Electronically Signed   By: Kathreen Devoid   On: 01/05/2014 13:56   Ct Cervical Spine Wo Contrast  01/05/2014   CLINICAL DATA:  Status post fall  EXAM: CT HEAD WITHOUT CONTRAST  CT MAXILLOFACIAL WITHOUT CONTRAST  CT CERVICAL SPINE WITHOUT CONTRAST   TECHNIQUE: Multidetector CT imaging of the head, cervical spine, and maxillofacial  structures were performed using the standard protocol without intravenous contrast. Multiplanar CT image reconstructions of the cervical spine and maxillofacial structures were also generated.  COMPARISON:  None.  FINDINGS: CT HEAD FINDINGS  There is no evidence of mass effect, midline shift, or extra-axial fluid collections. There is no evidence of a space-occupying lesion or intracranial hemorrhage. There is no evidence of a cortical-based area of acute infarction. There is generalized cerebral atrophy. There is periventricular white matter low attenuation likely secondary to microangiopathy.  The ventricles and sulci are appropriate for the patient's age. The basal cisterns are patent.  Visualized portions of the orbits are unremarkable. The mastoid air cells are clear. There is an air-fluid level in the right sphenoid sinus. Bilateral ethmoid sinus mucosal thickening. Cerebrovascular atherosclerotic calcifications are noted.  The osseous structures are unremarkable. Severe right facial and right periorbital soft tissue swelling and hematoma.  CT MAXILLOFACIAL FINDINGS  Severe right periorbital and right facial soft tissue swelling as well as hematoma formation. The globes are intact. The orbital walls are intact. The orbital floors are intact. The maxilla is intact. The mandible is intact. The zygomatic arches are intact. The nasal septum is midline. There is no nasal bone fracture. The temporomandibular joints are normal.  Right sphenoid sinus air-fluid level. Bilateral ethmoid sinus mucosal thickening. The visualized portions of the mastoid sinuses are well aerated.  CT CERVICAL SPINE FINDINGS  The alignment is anatomic. The vertebral body heights are maintained. There is no acute fracture. There is no static listhesis. The prevertebral soft tissues are normal. The intraspinal soft tissues are not fully imaged on this  examination due to poor soft tissue contrast, but there is no gross soft tissue abnormality.  There is mild degenerative disc disease throughout the cervical spine. There are mild broad-based disc bulges at C3-4, C4-5, C5-6 and C6-7. At C3-4 there is bilateral facet arthropathy and bilateral uncovertebral degenerative change with left foraminal stenosis. At C4-5 there is bilateral facet arthropathy and uncovertebral degenerative change with bilateral foraminal stenosis, left greater than right. At C5-6 there is left uncovertebral degenerative change.  The visualized portions of the lung apices demonstrate no focal abnormality.There is bilateral carotid artery atherosclerosis.  IMPRESSION: 1. No acute intracranial pathology. 2. No acute osseous injury of the maxillofacial bones. Severe right facial soft tissue swelling and right periorbital soft tissue swelling as well as hematoma formation. 3. No acute osseous injury of the cervical spine. 4. Cervical spine spondylosis as described above.   Electronically Signed   By: Kathreen Devoid   On: 01/05/2014 13:56   Dg Chest Port 1 View  01/06/2014   CLINICAL DATA:  80 year old female with cough, wheeze, possible pneumonia. Initial encounter. Shortness of Breath.  EXAM: PORTABLE CHEST - 1 VIEW  COMPARISON:  01/05/2014 and earlier.  FINDINGS: Portable AP semi upright view at at 1514 hrs. Stable cardiomegaly and mediastinal contours. Mildly increased elevation of the left hemidiaphragm. New veiling opacity at the right lung base. Increased interstitial markings throughout. No definite consolidation. No pneumothorax. Calcified atherosclerosis of the aorta. Postoperative changes from thyroid surgery on the right.  IMPRESSION: Stable cardiomegaly with increased interstitial opacity diffusely and evidence of new right pleural effusion. Main differential considerations are interstitial edema and viral/ atypical respiratory infection.   Electronically Signed   By: Lars Pinks M.D.    On: 01/06/2014 15:47   Dg Swallowing Func-speech Pathology  01/07/2014   Macario Golds, CCC-SLP     01/07/2014  9:55 AM Objective Swallowing  Evaluation: Modified Barium Swallowing Study   Patient Details  Name: SALSABEEL GORELICK MRN: [id number] Date of Birth: [birth year]  Today's Date: 01/07/2014 Time: 5102-5852 SLP Time Calculation (min): 30 min  Past Medical History:  Past Medical History  Diagnosis Date  . Lung cancer 09-11-2007    Dr Arlyce Dice did surgery; Dr. Inda Merlin at cancer ctr  . Allergic rhinitis   . Atrial fibrillation   . Hyperlipidemia   . Asthma   . Dysrhythmia     ATRIAL FIBRILATION   Past Surgical History:  Past Surgical History  Procedure Laterality Date  . Inguinal hernia repair      right  . Thyroid surgery      nodule resection  . Lung lobectomy  09-11-2007    right upper lobe  . Hemorroidectomy     HPI:  80 year old female with a history of chronic atrial fibrillation,  non-small cell lung cancer status post right upper lobe  lobectomy, COPD, hypothyroidism, and hyperlipidemia presented to  ED after a mechanical fall sustained to her face. Increasing  shortness of breath for the past 2-3 days.  CT of the brain was  negative for any acute intracranial abnormality.  No hip fx's.   CXR 5/31 revealed No acute cardiopulmonary process.  MD note  reveals concern about pulmonary infection.  Daughter reports  difficulty with pills, cornbread and some coughing with liquids  prior to admission.      Assessment / Plan / Recommendation Clinical Impression  Dysphagia Diagnosis: Moderate oral phase dysphagia;Severe  cervical esophageal phase dysphagia;Mild pharyngeal phase  dysphagia   Clinical impression:   Moderate oral, mild pharyngeal and severe cervical esophageal  dysphagia without aspiration of any consistency tested.  Oral  deficits characterized by weakness/discoordination resulting in  lingual pumping, delays in transiting and excessive "mastication"  even after bolus cleared.  Pharyngeal dysphagia  characterized by  minimal delay swallow reflex and mild to moderate stasis without  pt awareness.    Pt 's cervical esophageal dysphagia characterized by appearance  of Zenker's diverticulum that fills with boluses (worse with  solids)  resulting in trace backflow of liquids into pharynx.   Suspect this is pt's primary source of dysphagia symptoms as pt  with poor awareness/sensation to stasis/backflow.  Cued dry  swallows when pt able to elicit aided clearance.    Using live video, SlP provided pt with education and reinforced  effective compensation strategies. Recommend puree/thin diet with  strict aspiration precautions/compensation strategies.  Daughter  Arville Go present for MBS and SlP educated her that pt likely has  component of chronic dysphagia/asp risk - role of SLP is  mitigation.    Recommend puree/thin with strict precautions.  SLP to follow for  tolerance, family/pt education.     Treatment Recommendation  Therapy as outlined in treatment plan below    Diet Recommendation Dysphagia 1 (Puree);Thin liquid   Liquid Administration via: Cup Medication Administration: Crushed with puree Supervision: Patient able to self feed;Full supervision/cueing  for compensatory strategies Compensations: Slow rate;Small sips/bites;Multiple dry swallows  after each bite/sip;Follow solids with liquid Postural Changes and/or Swallow Maneuvers: Seated upright 90  degrees;Upright 30-60 min after meal    Other  Recommendations Oral Care Recommendations: Oral care BID   Follow Up Recommendations    TBD   Frequency and Duration min 2x/week  2 weeks     General Date of Onset: 01/07/14 HPI: 80 year old female with a history of chronic atrial  fibrillation, non-small cell lung cancer status post right upper  lobe lobectomy, COPD, hypothyroidism, and hyperlipidemia  presented to ED after a mechanical fall sustained to her face.  Increasing shortness of breath for the past 2-3 days.  CT of the  brain was negative for any acute  intracranial abnormality.  No  hip fx's.  CXR 5/31 revealed No acute cardiopulmonary process.   MD note reveals concern about pulmonary infection.  Daughter  reports difficulty with pills, cornbread and some coughing with  liquids prior to admission.  Type of Study: Modified Barium Swallowing Study Reason for Referral: Objectively evaluate swallowing function Previous Swallow Assessment:  (none) Diet Prior to this Study: Dysphagia 3 (soft);Nectar-thick liquids Temperature Spikes Noted: No Respiratory Status: Nasal cannula History of Recent Intubation: No Behavior/Cognition: Alert;Cooperative Oral Cavity - Dentition: Edentulous Oral Motor / Sensory Function: Impaired - see Bedside swallow  eval Self-Feeding Abilities: Able to feed self;Needs set up;Total  assist Patient Positioning: Upright in chair Baseline Vocal Quality: Clear Volitional Cough: Weak Volitional Swallow: Able to elicit (with delay) Anatomy:  (appearance of Zenker's diverticulum) Pharyngeal Secretions: Standing secretions in (comment) (pharynx  mixing with secretions, further intake clears)    Reason for Referral Objectively evaluate swallowing function   Oral Phase Oral Preparation/Oral Phase Oral Phase: Impaired Oral - Nectar Oral - Nectar Cup: Piecemeal swallowing;Reduced posterior  propulsion;Lingual pumping;Delayed oral transit;Lingual/palatal  residue Oral - Thin Oral - Thin Teaspoon: Lingual pumping;Reduced posterior  propulsion;Piecemeal swallowing;Delayed oral  transit;Lingual/palatal residue Oral - Thin Cup: Reduced posterior propulsion;Lingual/palatal  residue;Piecemeal swallowing;Delayed oral transit;Lingual pumping Oral - Thin Straw: Lingual pumping;Reduced posterior  propulsion;Piecemeal swallowing;Lingual/palatal residue Oral - Solids Oral - Puree: Reduced posterior propulsion;Lingual  pumping;Piecemeal swallowing;Lingual/palatal residue;Delayed oral  transit Oral - Mechanical Soft: Lingual/palatal residue;Piecemeal  swallowing;Reduced  posterior propulsion;Lingual pumping;Delayed  oral transit (pt expectorated partially masticated cereal bar per  SlP verbal/visual cue) Oral Phase - Comment Oral Phase - Comment: pt presents with excessive "mastication"  even after completing swallows of solids - daughter reports this  to be baseline phenomenon, cues to dry swallow effective when pt  able to elicit (approx 65% opportunities)   Pharyngeal Phase Pharyngeal Phase Pharyngeal Phase: Impaired Pharyngeal - Nectar Pharyngeal - Nectar Teaspoon: Reduced pharyngeal  peristalsis;Pharyngeal residue - pyriform sinuses;Pharyngeal  residue - valleculae;Premature spillage to valleculae Pharyngeal - Nectar Cup: Pharyngeal residue -  valleculae;Pharyngeal residue - pyriform sinuses;Pharyngeal  residue - cp segment;Premature spillage to valleculae Pharyngeal - Thin Pharyngeal - Thin Teaspoon: Pharyngeal residue -  valleculae;Pharyngeal residue - pyriform sinuses;Pharyngeal  residue - cp segment;Lateral channel residue;Premature spillage  to valleculae Pharyngeal - Thin Cup: Lateral channel residue;Pharyngeal residue  - pyriform sinuses;Pharyngeal residue - valleculae;Pharyngeal  residue - cp segment;Premature spillage to valleculae Pharyngeal - Thin Straw: Pharyngeal residue - cp  segment;Pharyngeal residue - valleculae;Pharyngeal residue -  pyriform sinuses;Lateral channel residue;Premature spillage to  valleculae Pharyngeal - Solids Pharyngeal - Puree: Delayed swallow initiation;Premature spillage  to valleculae;Pharyngeal residue - valleculae Pharyngeal - Mechanical Soft: Delayed swallow  initiation;Premature spillage to valleculae;Pharyngeal residue -  valleculae Pharyngeal Phase - Comment Pharyngeal Comment: pt benefited from following solids with  liquids to faciliate pharyngeal clearance  Cervical Esophageal Phase    GO    Cervical Esophageal Phase Cervical Esophageal Phase: Impaired Cervical Esophageal Phase - Nectar Nectar Cup: Reduced cricopharyngeal  relaxation Cervical Esophageal Phase - Thin Thin Teaspoon: Reduced cricopharyngeal relaxation Thin Cup: Reduced cricopharyngeal relaxation;Esophageal backflow  into the pharynx Thin Straw: Reduced cricopharyngeal relaxation;Esophageal  backflow into the pharynx Cervical Esophageal Phase - Solids Puree: Reduced cricopharyngeal relaxation Mechanical Soft: Reduced  cricopharyngeal relaxation Cervical Esophageal Phase - Comment Cervical Esophageal Comment: liquid boluses flow around what  appears to be Zenker's diverticulum - radiologist not present to  confirm findings, did not provide pt with barium tablet due to  concern for it to become lodged in Zenker's diverticulum,  appearance of slow clearance throughout esophagus with ? tertiary  contractions, liquids facilitate clearance         Luanna Salk, MS [facility] SLP [phone]    Ct Maxillofacial Wo Cm  01/05/2014   CLINICAL DATA:  Status post fall  EXAM: CT HEAD WITHOUT CONTRAST  CT MAXILLOFACIAL WITHOUT CONTRAST  CT CERVICAL SPINE WITHOUT CONTRAST  TECHNIQUE: Multidetector CT imaging of the head, cervical spine, and maxillofacial structures were performed using the standard protocol without intravenous contrast. Multiplanar CT image reconstructions of the cervical spine and maxillofacial structures were also generated.  COMPARISON:  None.  FINDINGS: CT HEAD FINDINGS  There is no evidence of mass effect, midline shift, or extra-axial fluid collections. There is no evidence of a space-occupying lesion or intracranial hemorrhage. There is no evidence of a cortical-based area of acute infarction. There is generalized cerebral atrophy. There is periventricular white matter low attenuation likely secondary to microangiopathy.  The ventricles and sulci are appropriate for the patient's age. The basal cisterns are patent.  Visualized portions of the orbits are unremarkable. The mastoid air cells are clear. There is an air-fluid level in the right sphenoid sinus. Bilateral  ethmoid sinus mucosal thickening. Cerebrovascular atherosclerotic calcifications are noted.  The osseous structures are unremarkable. Severe right facial and right periorbital soft tissue swelling and hematoma.  CT MAXILLOFACIAL FINDINGS  Severe right periorbital and right facial soft tissue swelling as well as hematoma formation. The globes are intact. The orbital walls are intact. The orbital floors are intact. The maxilla is intact. The mandible is intact. The zygomatic arches are intact. The nasal septum is midline. There is no nasal bone fracture. The temporomandibular joints are normal.  Right sphenoid sinus air-fluid level. Bilateral ethmoid sinus mucosal thickening. The visualized portions of the mastoid sinuses are well aerated.  CT CERVICAL SPINE FINDINGS  The alignment is anatomic. The vertebral body heights are maintained. There is no acute fracture. There is no static listhesis. The prevertebral soft tissues are normal. The intraspinal soft tissues are not fully imaged on this examination due to poor soft tissue contrast, but there is no gross soft tissue abnormality.  There is mild degenerative disc disease throughout the cervical spine. There are mild broad-based disc bulges at C3-4, C4-5, C5-6 and C6-7. At C3-4 there is bilateral facet arthropathy and bilateral uncovertebral degenerative change with left foraminal stenosis. At C4-5 there is bilateral facet arthropathy and uncovertebral degenerative change with bilateral foraminal stenosis, left greater than right. At C5-6 there is left uncovertebral degenerative change.  The visualized portions of the lung apices demonstrate no focal abnormality.There is bilateral carotid artery atherosclerosis.  IMPRESSION: 1. No acute intracranial pathology. 2. No acute osseous injury of the maxillofacial bones. Severe right facial soft tissue swelling and right periorbital soft tissue swelling as well as hematoma formation. 3. No acute osseous injury of the  cervical spine. 4. Cervical spine spondylosis as described above.   Electronically Signed   By: Kathreen Devoid   On: 01/05/2014 13:56    Scheduled Meds: . ampicillin-sulbactam (UNASYN) IV  3 g Intravenous Q6H  . atorvastatin  10 mg Oral q1800  . azithromycin  500 mg Intravenous Q24H  . budesonide-formoterol  2 puff  Inhalation BID  . folic acid  1 mg Oral Daily  . ipratropium-albuterol  3 mL Nebulization Once  . ipratropium-albuterol  3 mL Nebulization TID  . latanoprost  1 drop Both Eyes QHS  . levothyroxine  75 mcg Oral QAC breakfast  . methylPREDNISolone (SOLU-MEDROL) injection  60 mg Intravenous Q12H  . metoprolol tartrate  12.5 mg Oral BID  . omega-3 acid ethyl esters  2 g Oral Daily  . pantoprazole  40 mg Oral Daily  . polyvinyl alcohol  1 drop Both Eyes BID  . potassium chloride  20 mEq Oral Once  . sodium chloride  3 mL Intravenous Q12H   Continuous Infusions: . diltiazem (CARDIZEM) infusion 5 mg/hr (01/07/14 1030)    Time spent: 35 minutes  Geradine Girt, DO  [facility] Pager [phone]. If 7PM-7AM, please contact night-coverage at [email or web address], password [facility] 01/07/2014, 12:10 PM  LOS: 2 days

## 2014-01-08 ENCOUNTER — Ambulatory Visit: Payer: Medicare Other | Admitting: Family Medicine

## 2014-01-08 ENCOUNTER — Inpatient Hospital Stay (HOSPITAL_COMMUNITY): Payer: Medicare Other

## 2014-01-08 DIAGNOSIS — Y92009 Unspecified place in unspecified non-institutional (private) residence as the place of occurrence of the external cause: Secondary | ICD-10-CM

## 2014-01-08 DIAGNOSIS — R4182 Altered mental status, unspecified: Secondary | ICD-10-CM

## 2014-01-08 LAB — BASIC METABOLIC PANEL
BUN: 28 mg/dL — ABNORMAL HIGH (ref 6–23)
CO2: 30 meq/L (ref 19–32)
Calcium: 8.6 mg/dL (ref 8.4–10.5)
Chloride: 99 mEq/L (ref 96–112)
Creatinine, Ser: 0.5 mg/dL (ref 0.50–1.10)
GFR calc Af Amer: >90 mL/min (ref >90)
GFR calc non Af Amer: 83 mL/min — ABNORMAL LOW (ref >90)
GLUCOSE: 137 mg/dL — AB (ref 70–99)
Potassium: 3.6 mEq/L — ABNORMAL LOW (ref 3.7–5.3)
SODIUM: 140 meq/L (ref 137–147)

## 2014-01-08 LAB — CBC
HCT: 30.4 % — ABNORMAL LOW (ref 36.0–46.0)
HEMOGLOBIN: 10.1 g/dL — AB (ref 12.0–15.0)
MCH: 30.2 pg (ref 26.0–34.0)
MCHC: 33.2 g/dL (ref 30.0–36.0)
MCV: 91 fL (ref 78.0–100.0)
PLATELETS: 98 10*3/uL — AB (ref 150–400)
RBC: 3.34 MIL/uL — ABNORMAL LOW (ref 3.87–5.11)
RDW: 13.7 % (ref 11.5–15.5)
WBC: 11.8 10*3/uL — AB (ref 4.0–10.5)

## 2014-01-08 MED ORDER — NYSTATIN 100000 UNIT/ML MT SUSP
5.0000 mL | Freq: Four times a day (QID) | OROMUCOSAL | Status: DC
  Administered 2014-01-08 – 2014-01-13 (×17): 500000 [IU] via ORAL
  Filled 2014-01-08 (×23): qty 5

## 2014-01-08 MED ORDER — FLUCONAZOLE 100 MG PO TABS
100.0000 mg | ORAL_TABLET | Freq: Every day | ORAL | Status: DC
  Administered 2014-01-08 – 2014-01-13 (×6): 100 mg via ORAL
  Filled 2014-01-08 (×6): qty 1

## 2014-01-08 NOTE — Progress Notes (Signed)
Clinical Social Work Department BRIEF PSYCHOSOCIAL ASSESSMENT 01/08/2014  Patient:  Erica [name], Erica [name]     Account Number:  [id number]     Admit date:  01/05/2014  Clinical Social Worker:  Megan Salon  Date/Time:  01/08/2014 01:36 PM  Referred by:  Physician  Date Referred:  01/08/2014 Referred for  SNF Placement   Other Referral:   Interview type:  Other - See comment Other interview type:   CSW spoke to patient's daughter by bedside    PSYCHOSOCIAL DATA Living Status:  ALONE Admitted from facility:   Level of care:   Primary support name:  Erica [name] Primary support relationship to patient:  CHILD, ADULT Degree of support available:   Good    CURRENT CONCERNS Current Concerns  Post-Acute Placement   Other Concerns:    SOCIAL WORK ASSESSMENT / PLAN Clinical Social Worker received referral for SNF placement at d/c. CW reviewed chart and noticed patient only alert to person.  CSW introduced self and explained reason for visit. Patient had visitor by bedside, patient's daughter.Patient was sitting up in bed listening to conversation, but did not speak during conversation. CSW explained SNF process and provided SNF packet to patient and family. Patient's daughter states she is unsure if she wants patient to come back home with her and provide 24 hour care or go to SNF for short term rehab. Patient's daughter reported she is agreeable for social worker to fax information out and start process of SNF placement.  CSW will complete FL2 for MD's signature and will update family when bed offers are received.   Assessment/plan status:  Psychosocial Support/Ongoing Assessment of Needs Other assessment/ plan:   Information/referral to community resources:   SNF packet    PATIENT'S/FAMILY'S RESPONSE TO PLAN OF CARE: Patient's daughter states that she is unsure if she wants patient to go home with her or go to SNF. Patient's daughter gave social worker permission to fax patient's  informaton out to [facility].        Jeanette Caprice, MSW, [place]

## 2014-01-08 NOTE — Progress Notes (Addendum)
[facility] PROGRESS NOTE  Erica [name] PXT:[id number] DOB: [birth year] DOA: 01/05/2014 PCP: Redge Gainer, MD  Assessment/Plan:   Atrial fibrillation with RVR:  -resume PO BB -d/c gtt  Patient is not a candidate for Coumadin any longer, given the severity of her fall, advanced age.   AMS (acute metabolic encephalopathy) -? From infection -?CVA- MRI ordered but patient not able to complete as she could not stay still; as > 48 hours since symptoms began, will get repeat head CT for possible CVA    Fever, unspecified:  -?PNA ABX -?aspiration- change to zosyn  Dysphagia -diet dys 1    Hypothyroidism:  TSH normal    COPD exacerbation:  wean steroids and bronchodilators.    Facial hematoma:  No fractures.    Hypokalemia:  Replete IV    Thrombocytopenia, unspecified:  -B12 and folate ok    Hematoma of right thigh    Cough: See above    Essential hypertension, benign: Blood pressure borderline low currently. See above.    Fall at home: Will order PT OT. I doubt patient is safe to live alone. She may go home to live with her daughter vs SNF.  Elevated pro BNP: echocardiogram ok. Consider lasix if breathing continues to be difficult  Code Status:  DO NOT RESUSCITATE Family Communication:  Daughter at bedside Disposition Plan:  ?  Consultants:    Procedures:       HPI/Subjective: Per daughter before admission was driving and paying her own bills Patient had a better night last PM- not seeing things but still not back to baseline  Objective: Filed Vitals:   01/08/14 0611  BP: 122/79  Pulse: 102  Temp: 98 F (36.7 C)  Resp: 16    Intake/Output Summary (Last 24 hours) at 01/08/14 1044 Last data filed at 01/08/14 0730  Gross per 24 hour  Intake 599.42 ml  Output    650 ml  Net -50.58 ml   Filed Weights   01/05/14 1244 01/05/14 1750  Weight: 61.236 kg (135 lb) 55.8 kg (123 lb 0.3 oz)   Telemetry: Atrial fibrillation rate about  100  Exam:   General:  Elderly frail female alert oriented and smiling- oriented to person but not place/time  HEENT: . Massive right facial and periorbita full hematoma with conjunctival hemorrhage. Visual Acuity normal, and extraocular movements intact.  Cardiovascular: Irregularly irregular without murmurs gallops rubs  Respiratory: Rhonchi and please and prolonged expiratory phase with tachypnea  Abdomen: Soft nontender nondistended  Ext: Large right thigh hematoma no edema peripherally   Basic Metabolic Panel:  Recent Labs Lab 01/05/14 1251 01/06/14 0017 01/07/14 0400 01/08/14 0415  NA 141 142 138 140  K 3.3* 2.6* 3.2* 3.6*  CL 100 100 97 99  CO2 27 27 28 30   GLUCOSE 122* 137* 157* 137*  BUN 21 21 30* 28*  CREATININE 0.41* 0.48* 0.53 0.50  CALCIUM 8.5 8.1* 8.6 8.6  MG  --  1.7  --   --    Liver Function Tests: No results found for this basename: AST, ALT, ALKPHOS, BILITOT, PROT, ALBUMIN,  in the last 168 hours No results found for this basename: LIPASE, AMYLASE,  in the last 168 hours No results found for this basename: AMMONIA,  in the last 168 hours CBC:  Recent Labs Lab 01/05/14 1251 01/06/14 0017 01/07/14 0400 01/08/14 0415  WBC 3.6* 3.7* 6.8 11.8*  HGB 12.9 11.0* 10.0* 10.1*  HCT 39.3 32.0* 29.3* 30.4*  MCV 91.6 91.2 89.6 91.0  PLT 65* 60* 70* 98*   Cardiac Enzymes:  Recent Labs Lab 01/05/14 2023 01/06/14 0017 01/06/14 0907  TROPONINI <0.30 <0.30 <0.30   BNP (last 3 results)  Recent Labs  01/05/14 2023  PROBNP 4533.0*   CBG: No results found for this basename: GLUCAP,  in the last 168 hours  Recent Results (from the past 240 hour(s))  CULTURE, BLOOD (ROUTINE X 2)     Status: None   Collection Time    01/05/14  8:43 PM      Result Value Ref Range Status   Specimen Description BLOOD LEFT HAND   Final   Special Requests BOTTLES DRAWN AEROBIC ONLY 5CC   Final   Culture  Setup Time     Final   Value: 01/06/2014 09:09     Performed  at [facility]   Culture     Final   Value:        BLOOD CULTURE RECEIVED NO GROWTH TO DATE CULTURE WILL BE HELD FOR 5 DAYS BEFORE ISSUING A FINAL NEGATIVE REPORT     Performed at [facility]   Report Status PENDING   Incomplete  CULTURE, BLOOD (ROUTINE X 2)     Status: None   Collection Time    01/05/14  9:50 PM      Result Value Ref Range Status   Specimen Description BLOOD RIGHT ARM   Final   Special Requests BOTTLES DRAWN AEROBIC AND ANAEROBIC 10CC EACH   Final   Culture  Setup Time     Final   Value: 01/06/2014 09:10     Performed at [facility]   Culture     Final   Value:        BLOOD CULTURE RECEIVED NO GROWTH TO DATE CULTURE WILL BE HELD FOR 5 DAYS BEFORE ISSUING A FINAL NEGATIVE REPORT     Performed at [facility]   Report Status PENDING   Incomplete  URINE CULTURE     Status: None   Collection Time    01/06/14  7:00 AM      Result Value Ref Range Status   Specimen Description URINE, CLEAN CATCH   Final   Special Requests NONE   Final   Culture  Setup Time     Final   Value: 01/06/2014 12:53     Performed at [place]     Final   Value: 60,000 COLONIES/ML     Performed at [facility]   Culture     Final   Value: Multiple bacterial morphotypes present, none predominant. Suggest appropriate recollection if clinically indicated.     Performed at [facility]   Report Status 01/07/2014 FINAL   Final     Studies: Dg Chest Port 1 View  01/06/2014   CLINICAL DATA:  80 year old female with cough, wheeze, possible pneumonia. Initial encounter. Shortness of Breath.  EXAM: PORTABLE CHEST - 1 VIEW  COMPARISON:  01/05/2014 and earlier.  FINDINGS: Portable AP semi upright view at at 1514 hrs. Stable cardiomegaly and mediastinal contours. Mildly increased elevation of the left hemidiaphragm. New veiling opacity at the right lung base. Increased interstitial markings throughout. No definite consolidation. No  pneumothorax. Calcified atherosclerosis of the aorta. Postoperative changes from thyroid surgery on the right.  IMPRESSION: Stable cardiomegaly with increased interstitial opacity diffusely and evidence of new right pleural effusion. Main differential considerations are interstitial edema and viral/ atypical respiratory infection.   Electronically Signed  By: Lars Pinks M.D.   On: 01/06/2014 15:47   Dg Swallowing Func-speech Pathology  01/07/2014   Macario Golds, CCC-SLP     01/07/2014  9:55 AM Objective Swallowing Evaluation: Modified Barium Swallowing Study   Patient Details  Name: Erica [name] MRN: [id number] Date of Birth: 1921-02-06  Today's Date: 01/07/2014 Time: 2585-2778 SLP Time Calculation (min): 30 min  Past Medical History:  Past Medical History  Diagnosis Date  . Lung cancer 09-11-2007    Dr Arlyce Dice did surgery; Dr. Inda Merlin at cancer ctr  . Allergic rhinitis   . Atrial fibrillation   . Hyperlipidemia   . Asthma   . Dysrhythmia     ATRIAL FIBRILATION   Past Surgical History:  Past Surgical History  Procedure Laterality Date  . Inguinal hernia repair      right  . Thyroid surgery      nodule resection  . Lung lobectomy  09-11-2007    right upper lobe  . Hemorroidectomy     HPI:  80 year old female with a history of chronic atrial fibrillation,  non-small cell lung cancer status post right upper lobe  lobectomy, COPD, hypothyroidism, and hyperlipidemia presented to  ED after a mechanical fall sustained to her face. Increasing  shortness of breath for the past 2-3 days.  CT of the brain was  negative for any acute intracranial abnormality.  No hip fx's.   CXR 5/31 revealed No acute cardiopulmonary process.  MD note  reveals concern about pulmonary infection.  Daughter reports  difficulty with pills, cornbread and some coughing with liquids  prior to admission.      Assessment / Plan / Recommendation Clinical Impression  Dysphagia Diagnosis: Moderate oral phase dysphagia;Severe  cervical esophageal phase  dysphagia;Mild pharyngeal phase  dysphagia   Clinical impression:   Moderate oral, mild pharyngeal and severe cervical esophageal  dysphagia without aspiration of any consistency tested.  Oral  deficits characterized by weakness/discoordination resulting in  lingual pumping, delays in transiting and excessive "mastication"  even after bolus cleared.  Pharyngeal dysphagia characterized by  minimal delay swallow reflex and mild to moderate stasis without  pt awareness.    Pt 's cervical esophageal dysphagia characterized by appearance  of Zenker's diverticulum that fills with boluses (worse with  solids)  resulting in trace backflow of liquids into pharynx.   Suspect this is pt's primary source of dysphagia symptoms as pt  with poor awareness/sensation to stasis/backflow.  Cued dry  swallows when pt able to elicit aided clearance.    Using live video, SlP provided pt with education and reinforced  effective compensation strategies. Recommend puree/thin diet with  strict aspiration precautions/compensation strategies.  Daughter  Arville Go present for MBS and SlP educated her that pt likely has  component of chronic dysphagia/asp risk - role of SLP is  mitigation.    Recommend puree/thin with strict precautions.  SLP to follow for  tolerance, family/pt education.     Treatment Recommendation  Therapy as outlined in treatment plan below    Diet Recommendation Dysphagia 1 (Puree);Thin liquid   Liquid Administration via: Cup Medication Administration: Crushed with puree Supervision: Patient able to self feed;Full supervision/cueing  for compensatory strategies Compensations: Slow rate;Small sips/bites;Multiple dry swallows  after each bite/sip;Follow solids with liquid Postural Changes and/or Swallow Maneuvers: Seated upright 90  degrees;Upright 30-60 min after meal    Other  Recommendations Oral Care Recommendations: Oral care BID   Follow Up Recommendations    TBD   Frequency and  Duration min 2x/week  2 weeks     General  Date of Onset: 01/07/14 HPI: 80 year old female with a history of chronic atrial  fibrillation, non-small cell lung cancer status post right upper  lobe lobectomy, COPD, hypothyroidism, and hyperlipidemia  presented to ED after a mechanical fall sustained to her face.  Increasing shortness of breath for the past 2-3 days.  CT of the  brain was negative for any acute intracranial abnormality.  No  hip fx's.  CXR 5/31 revealed No acute cardiopulmonary process.   MD note reveals concern about pulmonary infection.  Daughter  reports difficulty with pills, cornbread and some coughing with  liquids prior to admission.  Type of Study: Modified Barium Swallowing Study Reason for Referral: Objectively evaluate swallowing function Previous Swallow Assessment:  (none) Diet Prior to this Study: Dysphagia 3 (soft);Nectar-thick liquids Temperature Spikes Noted: No Respiratory Status: Nasal cannula History of Recent Intubation: No Behavior/Cognition: Alert;Cooperative Oral Cavity - Dentition: Edentulous Oral Motor / Sensory Function: Impaired - see Bedside swallow  eval Self-Feeding Abilities: Able to feed self;Needs set up;Total  assist Patient Positioning: Upright in chair Baseline Vocal Quality: Clear Volitional Cough: Weak Volitional Swallow: Able to elicit (with delay) Anatomy:  (appearance of Zenker's diverticulum) Pharyngeal Secretions: Standing secretions in (comment) (pharynx  mixing with secretions, further intake clears)    Reason for Referral Objectively evaluate swallowing function   Oral Phase Oral Preparation/Oral Phase Oral Phase: Impaired Oral - Nectar Oral - Nectar Cup: Piecemeal swallowing;Reduced posterior  propulsion;Lingual pumping;Delayed oral transit;Lingual/palatal  residue Oral - Thin Oral - Thin Teaspoon: Lingual pumping;Reduced posterior  propulsion;Piecemeal swallowing;Delayed oral  transit;Lingual/palatal residue Oral - Thin Cup: Reduced posterior propulsion;Lingual/palatal  residue;Piecemeal  swallowing;Delayed oral transit;Lingual pumping Oral - Thin Straw: Lingual pumping;Reduced posterior  propulsion;Piecemeal swallowing;Lingual/palatal residue Oral - Solids Oral - Puree: Reduced posterior propulsion;Lingual  pumping;Piecemeal swallowing;Lingual/palatal residue;Delayed oral  transit Oral - Mechanical Soft: Lingual/palatal residue;Piecemeal  swallowing;Reduced posterior propulsion;Lingual pumping;Delayed  oral transit (pt expectorated partially masticated cereal bar per  SlP verbal/visual cue) Oral Phase - Comment Oral Phase - Comment: pt presents with excessive "mastication"  even after completing swallows of solids - daughter reports this  to be baseline phenomenon, cues to dry swallow effective when pt  able to elicit (approx 40% opportunities)   Pharyngeal Phase Pharyngeal Phase Pharyngeal Phase: Impaired Pharyngeal - Nectar Pharyngeal - Nectar Teaspoon: Reduced pharyngeal  peristalsis;Pharyngeal residue - pyriform sinuses;Pharyngeal  residue - valleculae;Premature spillage to valleculae Pharyngeal - Nectar Cup: Pharyngeal residue -  valleculae;Pharyngeal residue - pyriform sinuses;Pharyngeal  residue - cp segment;Premature spillage to valleculae Pharyngeal - Thin Pharyngeal - Thin Teaspoon: Pharyngeal residue -  valleculae;Pharyngeal residue - pyriform sinuses;Pharyngeal  residue - cp segment;Lateral channel residue;Premature spillage  to valleculae Pharyngeal - Thin Cup: Lateral channel residue;Pharyngeal residue  - pyriform sinuses;Pharyngeal residue - valleculae;Pharyngeal  residue - cp segment;Premature spillage to valleculae Pharyngeal - Thin Straw: Pharyngeal residue - cp  segment;Pharyngeal residue - valleculae;Pharyngeal residue -  pyriform sinuses;Lateral channel residue;Premature spillage to  valleculae Pharyngeal - Solids Pharyngeal - Puree: Delayed swallow initiation;Premature spillage  to valleculae;Pharyngeal residue - valleculae Pharyngeal - Mechanical Soft: Delayed swallow   initiation;Premature spillage to valleculae;Pharyngeal residue -  valleculae Pharyngeal Phase - Comment Pharyngeal Comment: pt benefited from following solids with  liquids to faciliate pharyngeal clearance  Cervical Esophageal Phase    GO    Cervical Esophageal Phase Cervical Esophageal Phase: Impaired Cervical Esophageal Phase - Nectar Nectar Cup: Reduced cricopharyngeal relaxation Cervical Esophageal Phase - Thin Thin Teaspoon:  Reduced cricopharyngeal relaxation Thin Cup: Reduced cricopharyngeal relaxation;Esophageal backflow  into the pharynx Thin Straw: Reduced cricopharyngeal relaxation;Esophageal  backflow into the pharynx Cervical Esophageal Phase - Solids Puree: Reduced cricopharyngeal relaxation Mechanical Soft: Reduced cricopharyngeal relaxation Cervical Esophageal Phase - Comment Cervical Esophageal Comment: liquid boluses flow around what  appears to be Zenker's diverticulum - radiologist not present to  confirm findings, did not provide pt with barium tablet due to  concern for it to become lodged in Zenker's diverticulum,  appearance of slow clearance throughout esophagus with ? tertiary  contractions, liquids facilitate clearance         Luanna Salk, MS [facility] SLP [phone]     Scheduled Meds: . atorvastatin  10 mg Oral q1800  . budesonide-formoterol  2 puff Inhalation BID  . folic acid  1 mg Oral Daily  . ipratropium-albuterol  3 mL Nebulization Once  . ipratropium-albuterol  3 mL Nebulization TID  . latanoprost  1 drop Both Eyes QHS  . levothyroxine  75 mcg Oral QAC breakfast  . metoprolol tartrate  12.5 mg Oral BID  . omega-3 acid ethyl esters  2 g Oral Daily  . pantoprazole  40 mg Oral Daily  . piperacillin-tazobactam (ZOSYN)  IV  3.375 g Intravenous Q8H  . polyvinyl alcohol  1 drop Both Eyes BID  . potassium chloride  20 mEq Oral Once  . predniSONE  40 mg Oral Q breakfast  . sodium chloride  3 mL Intravenous Q12H   Continuous Infusions:    Time spent: 35 minutes  Geradine Girt, DO  [facility] Pager [phone]. If 7PM-7AM, please contact night-coverage at [email or web address], password [facility] 01/08/2014, 10:44 AM  LOS: 3 days

## 2014-01-08 NOTE — Progress Notes (Signed)
Clinical Social Work Department CLINICAL SOCIAL WORK PLACEMENT NOTE 01/08/2014  Patient:  MANNAT, BENEDETTI  Account Number:  [id number] Admit date:  01/05/2014  Clinical Social Worker:  Megan Salon  Date/time:  01/08/2014 01:41 PM  Clinical Social Work is seeking post-discharge placement for this patient at the following level of care:   [place]   (*CSW will update this form in Epic as items are completed)   01/08/2014  Patient/family provided with [place] Department of Clinical Social Work's list of facilities offering this level of care within the geographic area requested by the patient (or if unable, by the patient's family).  01/08/2014  Patient/family informed of their freedom to choose among providers that offer the needed level of care, that participate in Medicare, Medicaid or managed care program needed by the patient, have an available bed and are willing to accept the patient.  01/08/2014  Patient/family informed of [facility]' ownership interest in [facility], as well as of the fact that they are under no obligation to receive care at this facility.  PASARR submitted to EDS on 01/08/2014 PASARR number received from [employer] on 01/08/2014  FL2 transmitted to all facilities in geographic area requested by pt/family on  01/08/2014 FL2 transmitted to all facilities within larger geographic area on   Patient informed that his/her managed care company has contracts with or will negotiate with  certain facilities, including the following:     Patient/family informed of bed offers received:   Patient chooses bed at  Physician recommends and patient chooses bed at    Patient to be transferred to  on   Patient to be transferred to facility by   The following physician request were entered in Epic:   Additional Comments:  Jeanette Caprice, MSW, [place]

## 2014-01-08 NOTE — Consult Note (Signed)
Referring Physician: Eliseo Squires    Chief Complaint: stroke  HPI:                                                                                                                                         Erica [name] is an 80 y.o. female who lives alone and cares for herself. Per family member at bedside she still drives and takes care of all her bills. On Sunday patient was walking down her stairs when she tripped and fell. Patient sustained facial fractures and multiple  Bruising.  While in the ED she was noted to have Afib but is not a coumadin candidate due to her severity of fall and advanced age. While in the hospital she was noted to become slightly confused on Monday night and progressively more confused on Tuesday.  Family member at bedside states this confusion waxes and wanes but yesterday she was seeing people in the room and talking as if she was some other place other than the hospital. Currently she is being treated for possible PNA, WBC has increased from 3.7 to 11.8, UC showed possible dirty catch. CT head was obtained showing a right basal ganglia infarct. Currently patient is awake and following commands intermittently. She is in no distress but is unable to tell me where she is or why she was brought to hospital.   Date last known well: Date: 01/05/2014 Time last known well: Unable to determine tPA Given: No: out of window  Past Medical History  Diagnosis Date  . Lung cancer 09-11-2007    Dr Arlyce Dice did surgery; [facility]  . Allergic rhinitis   . Atrial fibrillation   . Hyperlipidemia   . Asthma   . Dysrhythmia     ATRIAL FIBRILATION    Past Surgical History  Procedure Laterality Date  . Inguinal hernia repair      right  . Thyroid surgery      nodule resection  . Lung lobectomy  09-11-2007    right upper lobe  . Hemorroidectomy      Family history: Daughter alive and well Social History:  reports that she has been passively smoking.  She has never used  smokeless tobacco. She reports that she does not drink alcohol or use illicit drugs.  Allergies: No Known Allergies  Medications:  Scheduled: . atorvastatin  10 mg Oral q1800  . budesonide-formoterol  2 puff Inhalation BID  . fluconazole  100 mg Oral Daily  . folic acid  1 mg Oral Daily  . ipratropium-albuterol  3 mL Nebulization Once  . ipratropium-albuterol  3 mL Nebulization TID  . latanoprost  1 drop Both Eyes QHS  . levothyroxine  75 mcg Oral QAC breakfast  . metoprolol tartrate  12.5 mg Oral BID  . nystatin  5 mL Oral QID  . omega-3 acid ethyl esters  2 g Oral Daily  . pantoprazole  40 mg Oral Daily  . piperacillin-tazobactam (ZOSYN)  IV  3.375 g Intravenous Q8H  . polyvinyl alcohol  1 drop Both Eyes BID  . potassium chloride  20 mEq Oral Once  . predniSONE  40 mg Oral Q breakfast  . sodium chloride  3 mL Intravenous Q12H    ROS:                                                                                                                                       History obtained from the patient and family memeber  General ROS: negative for - chills, fatigue, fever, night sweats, weight gain or weight loss Psychological ROS: negative for - behavioral disorder, hallucinations, memory difficulties, mood swings or suicidal ideation Ophthalmic ROS: negative for - blurry vision, double vision, eye pain or loss of vision ENT ROS: negative for - epistaxis, nasal discharge, oral lesions, sore throat, tinnitus or vertigo Allergy and Immunology ROS: negative for - hives or itchy/watery eyes Hematological and Lymphatic ROS: negative for - bleeding problems, bruising or swollen lymph nodes Endocrine ROS: negative for - galactorrhea, hair pattern changes, polydipsia/polyuria or temperature intolerance Respiratory ROS: negative for - cough, hemoptysis, shortness of  breath or wheezing Cardiovascular ROS: negative for - chest pain, dyspnea on exertion, edema or irregular heartbeat Gastrointestinal ROS: negative for - abdominal pain, diarrhea, hematemesis, nausea/vomiting or stool incontinence Genito-Urinary ROS: negative for - dysuria, hematuria, incontinence or urinary frequency/urgency Musculoskeletal ROS: negative for - joint swelling or muscular weakness Neurological ROS: as noted in HPI Dermatological ROS: negative for rash and skin lesion changes  Neurologic Examination:                                                                                                      Blood pressure 122/79, pulse 102, temperature 98 F (36.7 C), temperature source Oral, resp. rate 16, height 5\' 9"  (1.753 m), weight 55.8 kg (  123 lb 0.3 oz), SpO2 96.00%.   Mental Status: Alert,not  Oriented to place, date, year.  Speech fluent without evidence of aphasia.  Able to follow simple commands but at times need coaxing Cranial Nerves: II: Discs flat bilaterally; Visual fields blinks to threat bilaterally and able to count fingers, pupils equal, round, reactive to light and accommodation III,IV, VI: ptosis  Present right eye secondary to trauma, extra-ocular motions intact bilaterally V,VII: smile symmetric, facial light touch sensation normal bilaterally VIII: hearing normal bilaterally IX,X: gag reflex present XI: bilateral shoulder shrug XII: midline tongue extension without atrophy or fasciculations  Motor: Right : Upper extremity   4/5    Left:     Upper extremity   4/5  Lower extremity   4/5     Lower extremity   4/5 Tone and bulk:normal tone throughout; no atrophy noted Sensory: Pinprick and light touch intact throughout, bilaterally Deep Tendon Reflexes:  1+ throughout with no AJ  Plantars: Right: downgoing   Left: downgoing Cerebellar: normal finger-to-nose,  Unable to obtain heel-to-shin test Gait: not tested.  CV: pulses palpable throughout    Lab  Results: Basic Metabolic Panel:  Recent Labs Lab 01/05/14 1251 01/06/14 0017 01/07/14 0400 01/08/14 0415  NA 141 142 138 140  K 3.3* 2.6* 3.2* 3.6*  CL 100 100 97 99  CO2 27 27 28 30   GLUCOSE 122* 137* 157* 137*  BUN 21 21 30* 28*  CREATININE 0.41* 0.48* 0.53 0.50  CALCIUM 8.5 8.1* 8.6 8.6  MG  --  1.7  --   --     Liver Function Tests: No results found for this basename: AST, ALT, ALKPHOS, BILITOT, PROT, ALBUMIN,  in the last 168 hours No results found for this basename: LIPASE, AMYLASE,  in the last 168 hours No results found for this basename: AMMONIA,  in the last 168 hours  CBC:  Recent Labs Lab 01/05/14 1251 01/06/14 0017 01/07/14 0400 01/08/14 0415  WBC 3.6* 3.7* 6.8 11.8*  HGB 12.9 11.0* 10.0* 10.1*  HCT 39.3 32.0* 29.3* 30.4*  MCV 91.6 91.2 89.6 91.0  PLT 65* 60* 70* 98*    Cardiac Enzymes:  Recent Labs Lab 01/05/14 2023 01/06/14 0017 01/06/14 0907  TROPONINI <0.30 <0.30 <0.30    Lipid Panel: No results found for this basename: CHOL, TRIG, HDL, CHOLHDL, VLDL, LDLCALC,  in the last 168 hours  CBG: No results found for this basename: GLUCAP,  in the last 168 hours  Microbiology: Results for orders placed during the hospital encounter of 01/05/14  CULTURE, BLOOD (ROUTINE X 2)     Status: None   Collection Time    01/05/14  8:43 PM      Result Value Ref Range Status   Specimen Description BLOOD LEFT HAND   Final   Special Requests BOTTLES DRAWN AEROBIC ONLY 5CC   Final   Culture  Setup Time     Final   Value: 01/06/2014 09:09     Performed at [facility]   Culture     Final   Value:        BLOOD CULTURE RECEIVED NO GROWTH TO DATE CULTURE WILL BE HELD FOR 5 DAYS BEFORE ISSUING A FINAL NEGATIVE REPORT     Performed at [facility]   Report Status PENDING   Incomplete  CULTURE, BLOOD (ROUTINE X 2)     Status: None   Collection Time    01/05/14  9:50 PM      Result  Value Ref Range Status   Specimen Description BLOOD RIGHT  ARM   Final   Special Requests BOTTLES DRAWN AEROBIC AND ANAEROBIC 10CC EACH   Final   Culture  Setup Time     Final   Value: 01/06/2014 09:10     Performed at [facility]   Culture     Final   Value:        BLOOD CULTURE RECEIVED NO GROWTH TO DATE CULTURE WILL BE HELD FOR 5 DAYS BEFORE ISSUING A FINAL NEGATIVE REPORT     Performed at [facility]   Report Status PENDING   Incomplete  URINE CULTURE     Status: None   Collection Time    01/06/14  7:00 AM      Result Value Ref Range Status   Specimen Description URINE, CLEAN CATCH   Final   Special Requests NONE   Final   Culture  Setup Time     Final   Value: 01/06/2014 12:53     Performed at [facility]     Final   Value: 60,000 COLONIES/ML     Performed at [facility]   Culture     Final   Value: Multiple bacterial morphotypes present, none predominant. Suggest appropriate recollection if clinically indicated.     Performed at [facility]   Report Status 01/07/2014 FINAL   Final    Coagulation Studies:  Recent Labs  01/06/14 0017 01/07/14 0400  LABPROT 28.8* 18.4*  INR 2.83* 1.58*    Imaging: Ct Head Wo Contrast  01/08/2014   CLINICAL DATA:  AMS r/o CVA  EXAM: CT HEAD WITHOUT CONTRAST  TECHNIQUE: Contiguous axial images were obtained from the base of the skull through the vertex without intravenous contrast.  COMPARISON:  01/05/2014  FINDINGS: Atherosclerotic and physiologic intracranial calcifications. There is fluid level in the sphenoid sinus as before. Stable small right basal ganglia lacunar infarct. Diffuse parenchymal atrophy. Patchy areas of hypoattenuation in deep and periventricular white matter bilaterally. Negative for acute intracranial hemorrhage, mass lesion, acute infarction, midline shift, or mass-effect. Acute infarct may be inapparent on noncontrast CT. Ventricles and sulci symmetric. Bone windows demonstrate no focal lesion.  IMPRESSION: 1. Negative  for bleed or other acute intracranial process. 2. Sphenoid sinus disease as before.   Electronically Signed   By: Arne Cleveland M.D.   On: 01/08/2014 13:27   Dg Swallowing Func-speech Pathology  01/07/2014   Macario Golds, CCC-SLP     01/07/2014  9:55 AM Objective Swallowing Evaluation: Modified Barium Swallowing Study   Patient Details  Name: DAVIANNA DEUTSCHMAN MRN: [id number] Date of Birth: 01-11-1921  Today's Date: 01/07/2014 Time: 0102-7253 SLP Time Calculation (min): 30 min  Past Medical History:  Past Medical History  Diagnosis Date  . Lung cancer 09-11-2007    Dr Arlyce Dice did surgery; Dr. Inda Merlin [facility]  . Allergic rhinitis   . Atrial fibrillation   . Hyperlipidemia   . Asthma   . Dysrhythmia     ATRIAL FIBRILATION   Past Surgical History:  Past Surgical History  Procedure Laterality Date  . Inguinal hernia repair      right  . Thyroid surgery      nodule resection  . Lung lobectomy  09-11-2007    right upper lobe  . Hemorroidectomy     HPI:  80 year old female with a history of chronic atrial fibrillation,  non-small cell lung cancer status post  right upper lobe  lobectomy, COPD, hypothyroidism, and hyperlipidemia presented to  ED after a mechanical fall sustained to her face. Increasing  shortness of breath for the past 2-3 days.  CT of the brain was  negative for any acute intracranial abnormality.  No hip fx's.   CXR 5/31 revealed No acute cardiopulmonary process.  MD note  reveals concern about pulmonary infection.  Daughter reports  difficulty with pills, cornbread and some coughing with liquids  prior to admission.      Assessment / Plan / Recommendation Clinical Impression  Dysphagia Diagnosis: Moderate oral phase dysphagia;Severe  cervical esophageal phase dysphagia;Mild pharyngeal phase  dysphagia   Clinical impression:   Moderate oral, mild pharyngeal and severe cervical esophageal  dysphagia without aspiration of any consistency tested.  Oral  deficits characterized by weakness/discoordination  resulting in  lingual pumping, delays in transiting and excessive "mastication"  even after bolus cleared.  Pharyngeal dysphagia characterized by  minimal delay swallow reflex and mild to moderate stasis without  pt awareness.    Pt 's cervical esophageal dysphagia characterized by appearance  of Zenker's diverticulum that fills with boluses (worse with  solids)  resulting in trace backflow of liquids into pharynx.   Suspect this is pt's primary source of dysphagia symptoms as pt  with poor awareness/sensation to stasis/backflow.  Cued dry  swallows when pt able to elicit aided clearance.    Using live video, SlP provided pt with education and reinforced  effective compensation strategies. Recommend puree/thin diet with  strict aspiration precautions/compensation strategies.  Daughter  Arville Go present for MBS and SlP educated her that pt likely has  component of chronic dysphagia/asp risk - role of SLP is  mitigation.    Recommend puree/thin with strict precautions.  SLP to follow for  tolerance, family/pt education.     Treatment Recommendation  Therapy as outlined in treatment plan below    Diet Recommendation Dysphagia 1 (Puree);Thin liquid   Liquid Administration via: Cup Medication Administration: Crushed with puree Supervision: Patient able to self feed;Full supervision/cueing  for compensatory strategies Compensations: Slow rate;Small sips/bites;Multiple dry swallows  after each bite/sip;Follow solids with liquid Postural Changes and/or Swallow Maneuvers: Seated upright 90  degrees;Upright 30-60 min after meal    Other  Recommendations Oral Care Recommendations: Oral care BID   Follow Up Recommendations    TBD   Frequency and Duration min 2x/week  2 weeks     General Date of Onset: 01/07/14 HPI: 80 year old female with a history of chronic atrial  fibrillation, non-small cell lung cancer status post right upper  lobe lobectomy, COPD, hypothyroidism, and hyperlipidemia  presented to ED after a mechanical fall  sustained to her face.  Increasing shortness of breath for the past 2-3 days.  CT of the  brain was negative for any acute intracranial abnormality.  No  hip fx's.  CXR 5/31 revealed No acute cardiopulmonary process.   MD note reveals concern about pulmonary infection.  Daughter  reports difficulty with pills, cornbread and some coughing with  liquids prior to admission.  Type of Study: Modified Barium Swallowing Study Reason for Referral: Objectively evaluate swallowing function Previous Swallow Assessment:  (none) Diet Prior to this Study: Dysphagia 3 (soft);Nectar-thick liquids Temperature Spikes Noted: No Respiratory Status: Nasal cannula History of Recent Intubation: No Behavior/Cognition: Alert;Cooperative Oral Cavity - Dentition: Edentulous Oral Motor / Sensory Function: Impaired - see Bedside swallow  eval Self-Feeding Abilities: Able to feed self;Needs set up;Total  assist Patient Positioning: Upright in chair Baseline  Vocal Quality: Clear Volitional Cough: Weak Volitional Swallow: Able to elicit (with delay) Anatomy:  (appearance of Zenker's diverticulum) Pharyngeal Secretions: Standing secretions in (comment) (pharynx  mixing with secretions, further intake clears)    Reason for Referral Objectively evaluate swallowing function   Oral Phase Oral Preparation/Oral Phase Oral Phase: Impaired Oral - Nectar Oral - Nectar Cup: Piecemeal swallowing;Reduced posterior  propulsion;Lingual pumping;Delayed oral transit;Lingual/palatal  residue Oral - Thin Oral - Thin Teaspoon: Lingual pumping;Reduced posterior  propulsion;Piecemeal swallowing;Delayed oral  transit;Lingual/palatal residue Oral - Thin Cup: Reduced posterior propulsion;Lingual/palatal  residue;Piecemeal swallowing;Delayed oral transit;Lingual pumping Oral - Thin Straw: Lingual pumping;Reduced posterior  propulsion;Piecemeal swallowing;Lingual/palatal residue Oral - Solids Oral - Puree: Reduced posterior propulsion;Lingual  pumping;Piecemeal  swallowing;Lingual/palatal residue;Delayed oral  transit Oral - Mechanical Soft: Lingual/palatal residue;Piecemeal  swallowing;Reduced posterior propulsion;Lingual pumping;Delayed  oral transit (pt expectorated partially masticated cereal bar per  SlP verbal/visual cue) Oral Phase - Comment Oral Phase - Comment: pt presents with excessive "mastication"  even after completing swallows of solids - daughter reports this  to be baseline phenomenon, cues to dry swallow effective when pt  able to elicit (approx 70% opportunities)   Pharyngeal Phase Pharyngeal Phase Pharyngeal Phase: Impaired Pharyngeal - Nectar Pharyngeal - Nectar Teaspoon: Reduced pharyngeal  peristalsis;Pharyngeal residue - pyriform sinuses;Pharyngeal  residue - valleculae;Premature spillage to valleculae Pharyngeal - Nectar Cup: Pharyngeal residue -  valleculae;Pharyngeal residue - pyriform sinuses;Pharyngeal  residue - cp segment;Premature spillage to valleculae Pharyngeal - Thin Pharyngeal - Thin Teaspoon: Pharyngeal residue -  valleculae;Pharyngeal residue - pyriform sinuses;Pharyngeal  residue - cp segment;Lateral channel residue;Premature spillage  to valleculae Pharyngeal - Thin Cup: Lateral channel residue;Pharyngeal residue  - pyriform sinuses;Pharyngeal residue - valleculae;Pharyngeal  residue - cp segment;Premature spillage to valleculae Pharyngeal - Thin Straw: Pharyngeal residue - cp  segment;Pharyngeal residue - valleculae;Pharyngeal residue -  pyriform sinuses;Lateral channel residue;Premature spillage to  valleculae Pharyngeal - Solids Pharyngeal - Puree: Delayed swallow initiation;Premature spillage  to valleculae;Pharyngeal residue - valleculae Pharyngeal - Mechanical Soft: Delayed swallow  initiation;Premature spillage to valleculae;Pharyngeal residue -  valleculae Pharyngeal Phase - Comment Pharyngeal Comment: pt benefited from following solids with  liquids to faciliate pharyngeal clearance  Cervical Esophageal Phase    GO     Cervical Esophageal Phase Cervical Esophageal Phase: Impaired Cervical Esophageal Phase - Nectar Nectar Cup: Reduced cricopharyngeal relaxation Cervical Esophageal Phase - Thin Thin Teaspoon: Reduced cricopharyngeal relaxation Thin Cup: Reduced cricopharyngeal relaxation;Esophageal backflow  into the pharynx Thin Straw: Reduced cricopharyngeal relaxation;Esophageal  backflow into the pharynx Cervical Esophageal Phase - Solids Puree: Reduced cricopharyngeal relaxation Mechanical Soft: Reduced cricopharyngeal relaxation Cervical Esophageal Phase - Comment Cervical Esophageal Comment: liquid boluses flow around what  appears to be Zenker's diverticulum - radiologist not present to  confirm findings, did not provide pt with barium tablet due to  concern for it to become lodged in Zenker's diverticulum,  appearance of slow clearance throughout esophagus with ? tertiary  contractions, liquids facilitate clearance         Luanna Salk, MS [facility] SLP [place] PA-C [facility] [phone]  01/08/2014, 4:35 PM   Patient seen and examined.  Clinical course and management discussed.  Necessary edits performed.  I agree with the above.  Assessment and plan of care developed and discussed below.     Assessment: 80 y.o. female s/p fall now with mental status changes.  Patient confused and hallucinating at times.  Daughter reports that she does not do badly until the night.  This is very unusual for her.  Although she is not fully oriented at baseline, she is independent.   Head CT reviewed and shows a right basal ganglion infarct that is chronic.  No acute changes are noted.  Patient also noted to have an elevated wbc count and evidence of dehydration.  Suspect mental status changes are multifactorial.  She likely has had a concussion and is also experiencing encephalopathic changes from an infection coupled with sundowning.  This was explained to the daughter.    Stroke Risk Factors -  atrial fibrillation  Recommendations: 1.  Will follow with you.  MRI not indicated at this time.  Suspect patient to improve with time and treatment of infection.   2.  Once medically reasonable patient may start ASA for stroke prophylaxis.      Alexis Goodell, MD [facility] [phone]  01/08/2014  5:20 PM

## 2014-01-08 NOTE — Progress Notes (Addendum)
Occupational Therapy Treatment Patient Details Name: Erica [name] MRN: [id number] DOB: 20-Sep-1920 Today's Date: 01/08/2014    History of present illness Pt admitted after fall at home with significant right sided face and thigh hematoma without fx. Pt stayed down 4 hrs until family arrived to visit because she didnt' want to call and bother anyone despite presence of life alert. CT of head negative and all xrays of hip and face negative for fx Pt with hx of non-small cell lung cancer status post right upper lobe lobectomy, COPD, hypothyroidism, and hyperlipidemia 01/08/2014 Ct right basal ganglia infarct noted    OT comments  Pt progressing with OT this session to complete 3n1 transfer. Pt continues to have cognitive deficits. Pt with focused attention during session. Pt was unable to recognize family in the room, recall information provided to patient within short time period, unable to verbalize personal age and reports living with her mother. Pt remains with cognitive impairments. Pt s/p fall, so rancho level provided with pt striking head, currently [place] recovery level V.   Follow Up Recommendations  SNF (due to CIR denial)    Equipment Recommendations  Other (comment) (defer to SNF)    Recommendations for Other Services      Precautions / Restrictions Precautions Precautions: Fall Precaution Comments: Rt eye edema ( patch now removed)       Mobility Bed Mobility Overal bed mobility: Needs Assistance Bed Mobility: Supine to Sit;Sit to Supine     Supine to sit: Min guard Sit to supine: Min assist   General bed mobility comments: pt requires (A) for bil LE to return supine  Transfers Overall transfer level: Needs assistance Equipment used: 1 person hand held assist Transfers: Sit to/from Stand Sit to Stand: Min assist         General transfer comment: pt with hand held (A) to transfer eob to 3n1    Balance Overall balance assessment: Needs  assistance Sitting-balance support: Bilateral upper extremity supported;Feet supported Sitting balance-Leahy Scale: Poor                             ADL Overall ADL's : Needs assistance/impaired                         Toilet Transfer: Minimal assistance;Stand-pivot;BSC   Toileting- Clothing Manipulation and Hygiene: Minimal assistance;Sit to/from stand         General ADL Comments: Pt supine on arrival asleep. pt aroused long sitting and attempting to exit bed on left side. Daughter asking patietn if she was okay and pt returned to supine asleep. pt with very restless behavior without goal directed reason. pt attempting to return to supine position at EOB with therapist. when asked reason for attempts at supine pt is unable to provide just states "i dont know" pt poor historian. Daughter asking many questions about d/c planning. Ot advised on several questions to ask facilities when deciding from a therapy standpoint. Pt asking about CIR consult. pt completed supine <>Sit and then stand pivot to bedside. Pt unable to void. Pt return to supine position      Vision                 Additional Comments: difficult to fully assess due to cognition so functional task used. pt able to read calendar. pt with poor attempts to track due to focused attention. pt is able to move ocular  movement in all directions. Pt noted edema over Rt eye with decr eye lid opening.    Perception     Praxis      Cognition   Behavior During Therapy: Restless Overall Cognitive Status: Impaired/Different from baseline Area of Impairment: Orientation;Attention;Memory;Following commands;Safety/judgement;Awareness;Problem solving;Rancho level Orientation Level: Disoriented to;Place;Time;Situation Current Attention Level: Focused Memory: Decreased recall of precautions;Decreased short-term memory  Following Commands: Follows one step commands inconsistently Safety/Judgement: Decreased  awareness of safety;Decreased awareness of deficits Awareness: Intellectual Problem Solving: Slow processing;Decreased initiation General Comments: Pt reports she lives with her mother when asked about home setting. pt laughed and called daugther "Mel Almond then angel" Pt laughing at her own response. pt unable to recognize daughter present in the room by name Mechele Claude. Pt has only one child . pt asked "what is your daughters name ? " pt provided correct answer. Pt unable to recall information after 2 minutes after 5 minutes with repeated education. pt reading name tag to answer location which showed problem solving.    Extremity/Trunk Assessment               Exercises     Shoulder Instructions       General Comments      Pertinent Vitals/ Pain       None reported  Home Living                                          Prior Functioning/Environment              Frequency Min 2X/week     Progress Toward Goals  OT Goals(current goals can now be found in the care plan section)  Progress towards OT goals: Progressing toward goals  Acute Rehab OT Goals Patient Stated Goal: none stated OT Goal Formulation: With patient Time For Goal Achievement: 01/21/14 Potential to Achieve Goals: Good ADL Goals Pt Will Perform Grooming: with min assist;sitting Pt Will Perform Upper Body Bathing: with min assist;sitting Pt Will Transfer to Toilet: with mod assist;ambulating Additional ADL Goal #1: Pt will sustain attention to task for 45 seconds   Plan Discharge plan needs to be updated    Co-evaluation                 End of Session     Activity Tolerance Patient limited by lethargy   Patient Left in bed;with call bell/phone within reach;with bed alarm set;with family/visitor present   Nurse Communication Mobility status;Precautions        Time: 6384-6659 OT Time Calculation (min): 39 min  Charges: OT General Charges $OT Visit: 1 Procedure OT  Treatments $Therapeutic Activity: 8-22 mins $Cognitive Skills Development: 23-37 mins  Peri Maris 01/08/2014, 2:50 PM Pager: [phone]

## 2014-01-08 NOTE — Progress Notes (Signed)
Speech Language Pathology Treatment: Dysphagia  Patient Details Name: Erica [name] MRN: [id number] DOB: 1921-03-18 Today's Date: 01/08/2014 Time: 9024-0973 SLP Time Calculation (min): 17 min  Assessment / Plan / Recommendation Clinical Impression  Pt awaiting transport to xray for CT head, daughter present and reports pt with poor intake and she is concerned about this issue.  Pt awake but appeared sleepy - daughter reports pt consumed 2 bites of food only before declining more.  SLP sat pt up in bed and observed her consuming water via cup.  Delayed cough noted x1 after multiple swallows observed.  Advised daughter to give liquids via tsp if decreases pt's cough and increases comfort - posted sign above bed.    Reviewed purpose of compensation strategies/diet modifications.  Advised daughter to importance for pt to be fully alert, not dyspneic and agreeable to consume po intake.   Concern for poor tolerance of po present due to pt's premorbid dysphagia *appearance of Zenker's, gross weakness, COPD and lethargy.    Pt appears with whitish coating on roof of mouth and posterior oral cavity/faucial pillars - ? Consistent with thrush.    Informed RN of need for pt's medications to be crushed that could be and consider alternative route if crushing is contraindicated.  SLP to follow for dysphagia management but pt will likely be at chronic risk.     HPI HPI: 80 year old female with a history of chronic atrial fibrillation, non-small cell lung cancer status post right upper lobe lobectomy, COPD, hypothyroidism, and hyperlipidemia presented to ED after a mechanical fall sustained to her face. Increasing shortness of breath for the past 2-3 days.  CT of the brain was negative for any acute intracranial abnormality.  No hip fx's.  CXR 5/31 revealed No acute cardiopulmonary process.  MD note reveals concern about pulmonary infection.  Daughter reports difficulty with pills, cornbread and some coughing with  liquids prior to admission.    Pertinent Vitals Afebrile, decreased  SLP Plan  Continue with current plan of care    Recommendations Diet recommendations: Dysphagia 1 (puree);Thin liquid Liquids provided via: Cup;Teaspoon Medication Administration: Crushed with puree Supervision: Patient able to self feed;Full supervision/cueing for compensatory strategies Compensations: Slow rate;Small sips/bites;Multiple dry swallows after each bite/sip;Follow solids with liquid Postural Changes and/or Swallow Maneuvers: Seated upright 90 degrees;Upright 30-60 min after meal              Oral Care Recommendations: Oral care BID Follow up Recommendations: Skilled Nursing facility Plan: Continue with current plan of care    [place], [place] [facility] SLP [phone]

## 2014-01-09 LAB — BASIC METABOLIC PANEL
BUN: 20 mg/dL (ref 6–23)
CHLORIDE: 101 meq/L (ref 96–112)
CO2: 31 meq/L (ref 19–32)
CREATININE: 0.43 mg/dL — AB (ref 0.50–1.10)
Calcium: 8.7 mg/dL (ref 8.4–10.5)
GFR calc Af Amer: >90 mL/min (ref >90)
GFR calc non Af Amer: 87 mL/min — ABNORMAL LOW (ref >90)
Glucose, Bld: 134 mg/dL — ABNORMAL HIGH (ref 70–99)
Potassium: 3.4 mEq/L — ABNORMAL LOW (ref 3.7–5.3)
Sodium: 141 mEq/L (ref 137–147)

## 2014-01-09 LAB — CBC
HEMATOCRIT: 31.2 % — AB (ref 36.0–46.0)
Hemoglobin: 10.2 g/dL — ABNORMAL LOW (ref 12.0–15.0)
MCH: 30.2 pg (ref 26.0–34.0)
MCHC: 32.7 g/dL (ref 30.0–36.0)
MCV: 92.3 fL (ref 78.0–100.0)
Platelets: 117 10*3/uL — ABNORMAL LOW (ref 150–400)
RBC: 3.38 MIL/uL — AB (ref 3.87–5.11)
RDW: 13.6 % (ref 11.5–15.5)
WBC: 11.6 10*3/uL — AB (ref 4.0–10.5)

## 2014-01-09 MED ORDER — GUAIFENESIN ER 600 MG PO TB12: 600.0000 mg | ORAL_TABLET | Freq: Two times a day (BID) | ORAL | Status: DC

## 2014-01-09 MED ORDER — POTASSIUM CHLORIDE CRYS ER 20 MEQ PO TBCR
40.0000 meq | EXTENDED_RELEASE_TABLET | Freq: Once | ORAL | Status: AC
  Administered 2014-01-09: 40 meq via ORAL
  Filled 2014-01-09: qty 2

## 2014-01-09 MED ORDER — GUAIFENESIN 100 MG/5ML PO SYRP
200.0000 mg | ORAL_SOLUTION | ORAL | Status: DC | PRN
  Administered 2014-01-10 – 2014-01-12 (×4): 200 mg via ORAL
  Filled 2014-01-09 (×4): qty 10

## 2014-01-09 MED ORDER — POTASSIUM CHLORIDE 20 MEQ/15ML (10%) PO LIQD
40.0000 meq | Freq: Once | ORAL | Status: DC
  Filled 2014-01-09: qty 30

## 2014-01-09 MED ORDER — METOPROLOL TARTRATE 25 MG PO TABS
25.0000 mg | ORAL_TABLET | Freq: Two times a day (BID) | ORAL | Status: DC
  Administered 2014-01-09 – 2014-01-10 (×4): 25 mg via ORAL
  Filled 2014-01-09 (×6): qty 1

## 2014-01-09 NOTE — Progress Notes (Signed)
Physical Therapy Treatment Patient Details Name: Erica [name] MRN: [id number] DOB: [birth year] Today's Date: 01/09/2014    History of Present Illness Pt admitted after fall at home with significant right sided face and thigh hematoma without fx. Pt stayed down 4 hrs until family arrived to visit because she didnt' want to call and bother anyone despite presence of life alert. CT of head negative and all xrays of hip and face negative for fx Pt with significant AMS (not recalling family members, reason for hospitalization)--previously very sharp-minded & living alone per family. Neuro consult favors AMS due to infection/pneumonia (vs ?TBI). Pt with hx of non-small cell lung cancer status post right upper lobe lobectomy, COPD, hypothyroidism, and hyperlipidemia    PT Comments    Pt limited with mobility due to elevated HR (up to 129 at EOB). RN aware and reported pt had not yet had her metoprolol (and MD is adjusting her dose up). Remains extremely confused (does not recognize her daughter or why she is in the hospital) and cannot remember this information 60 seconds after she is told.    Follow Up Recommendations  SNF (insurance denied inpt rehab stay)     Equipment Recommendations  Rolling walker with 5" wheels    Recommendations for Other Services Speech consult (for cognition)     Precautions / Restrictions Precautions Precautions: Fall    Mobility  Bed Mobility Overal bed mobility: Needs Assistance Bed Mobility: Rolling;Sidelying to Sit Rolling: Min assist Sidelying to sit: Min assist       General bed mobility comments: requires verbal and tactile cues to initiate; assist to raise torso due to weakness and ? rib pain  Transfers Overall transfer level: Needs assistance Equipment used: 1 person hand held assist Transfers: Sit to/from [facility] Sit to Stand: Min assist Stand pivot transfers: Min assist       General transfer comment: pt with hand held  (A) to transfer eob to recliner.   Ambulation/Gait             General Gait Details: unable to ambulate due to HR 129 at EOB   Stairs            Wheelchair Mobility    Modified Rankin (Stroke Patients Only)       Balance Overall balance assessment: Needs assistance Sitting-balance support: Bilateral upper extremity supported;Feet supported Sitting balance-Leahy Scale: Poor Sitting balance - Comments: requires at least one UE for support in sitting (however no outside physical support)   Standing balance support: Bilateral upper extremity supported Standing balance-Leahy Scale: Poor Standing balance comment: pt holding onto bedrail, then armrest and also HHA on other side                    Cognition Arousal/Alertness: Awake/alert Behavior During Therapy: WFL for tasks assessed/performed Overall Cognitive Status: Impaired/Different from baseline Area of Impairment: Orientation;Attention;Memory;Following commands;Safety/judgement;Awareness;Problem solving;[place]ancho level Orientation Level: Disoriented to;Place;Time;Situation Current Attention Level: Focused Memory: Decreased recall of precautions;Decreased short-term memory Following Commands: Follows one step commands inconsistently;Follows one step commands with increased time Safety/Judgement: Decreased awareness of safety;Decreased awareness of deficits Awareness:  (not yet at intellectual level; no memory of why hospitalized) Problem Solving: Slow processing;Decreased initiation;Requires tactile cues;Difficulty sequencing;Requires verbal cues General Comments: Pt able to state her daughter's name is "Mechele Claude" however did not know how they were related (thought she was an old schoolmate). Could not recall that she was her daughter or why hospitalized after 60 seconds    Exercises General  Exercises - Lower Extremity Ankle Circles/Pumps: AROM;Both;10 reps;Supine Heel Slides: AROM;Both;5 reps;Supine     General Comments        Pertinent Vitals/Pain Pt on room air on arrival. Upon sitting EOB, pt with incr dyspnea and noted SaO2 89%. Daughter reports pt continues to remove her nasal cannula, however she allowed [place] to put it on and SaO2 up to 94% on 2L. RN made aware.  Denied pain however holds pillow to her chest and grimaces when coughing.    Home Living                      Prior Function            PT Goals (current goals can now be found in the care plan section) Acute Rehab PT Goals Patient Stated Goal: none stated Progress towards PT goals: Progressing toward goals    Frequency  Min 2X/week    PT Plan Discharge plan needs to be updated;Frequency needs to be updated    Co-evaluation             End of Session Equipment Utilized During Treatment: Gait belt;Oxygen Activity Tolerance: Treatment limited secondary to medical complications (Comment) (elevated HR) Patient left: in chair;with call bell/phone within reach;with chair alarm set;with family/visitor present     Time: 9030-0923 PT Time Calculation (min): 23 min  Charges:  $Therapeutic Activity: 23-37 mins                    G CodesJeanie Cooks Helon Wisinski 01-15-2014, 11:40 AM Pager [phone]

## 2014-01-09 NOTE — Progress Notes (Signed)
[facility] PROGRESS NOTE  Erica [name] GYJ:[id number] DOB: [birth year] DOA: 01/05/2014 PCP: Redge Gainer, MD  Assessment/Plan:   Atrial fibrillation with RVR:  -resume PO BB -d/c gtt  Patient is not a candidate for Coumadin any longer, given the severity of her fall, advanced age.   AMS (acute metabolic encephalopathy) -? From infection vs concussion -continue to monitor -seen by neuro    Fever, unspecified:  -?PNA ABX -?aspiration- change to zosyn  Dysphagia -diet dys 1    Hypothyroidism:  TSH normal    COPD exacerbation:  wean steroids and bronchodilators. -add mucinex    Facial hematoma:  No fractures.    Hypokalemia:  Replete IV    Thrombocytopenia, unspecified:  -B12 and folate ok    Hematoma of right thigh  Thrush -nystatin -diflucan    Cough: See above    Essential hypertension, benign: Blood pressure borderline low currently. See above.  SNF.  Elevated pro BNP: echocardiogram ok. Consider lasix if breathing continues to be difficult  Code Status:  DO NOT RESUSCITATE Family Communication:  Daughter at bedside Disposition Plan:  SNF  Consultants:    Procedures:       HPI/Subjective: Per daughter before admission was driving and paying her own bills Still with memory issues  Objective: Filed Vitals:   01/09/14 0605  BP: 122/76  Pulse: 110  Temp: 98.5 F (36.9 C)  Resp: 22    Intake/Output Summary (Last 24 hours) at 01/09/14 1031 Last data filed at 01/09/14 2637  Gross per 24 hour  Intake    200 ml  Output    750 ml  Net   -550 ml   Filed Weights   01/05/14 1244 01/05/14 1750  Weight: 61.236 kg (135 lb) 55.8 kg (123 lb 0.3 oz)   Telemetry: Atrial fibrillation  Exam:   General:  Elderly frail female alert oriented and smiling- oriented to person but not place/time  HEENT: . Massive right facial and periorbita full hematoma with conjunctival hemorrhage. Visual Acuity normal, and extraocular movements  intact.  Cardiovascular: Irregularly irregular without murmurs gallops rubs  Respiratory: Rhonchi and please and prolonged expiratory phase with tachypnea  Abdomen: Soft nontender nondistended  Ext: Large right thigh hematoma no edema peripherally   Basic Metabolic Panel:  Recent Labs Lab 01/05/14 1251 01/06/14 0017 01/07/14 0400 01/08/14 0415 01/09/14 0345  NA 141 142 138 140 141  K 3.3* 2.6* 3.2* 3.6* 3.4*  CL 100 100 97 99 101  CO2 27 27 28 30 31   GLUCOSE 122* 137* 157* 137* 134*  BUN 21 21 30* 28* 20  CREATININE 0.41* 0.48* 0.53 0.50 0.43*  CALCIUM 8.5 8.1* 8.6 8.6 8.7  MG  --  1.7  --   --   --    Liver Function Tests: No results found for this basename: AST, ALT, ALKPHOS, BILITOT, PROT, ALBUMIN,  in the last 168 hours No results found for this basename: LIPASE, AMYLASE,  in the last 168 hours No results found for this basename: AMMONIA,  in the last 168 hours CBC:  Recent Labs Lab 01/05/14 1251 01/06/14 0017 01/07/14 0400 01/08/14 0415 01/09/14 0345  WBC 3.6* 3.7* 6.8 11.8* 11.6*  HGB 12.9 11.0* 10.0* 10.1* 10.2*  HCT 39.3 32.0* 29.3* 30.4* 31.2*  MCV 91.6 91.2 89.6 91.0 92.3  PLT 65* 60* 70* 98* 117*   Cardiac Enzymes:  Recent Labs Lab 01/05/14 2023 01/06/14 0017 01/06/14 0907  TROPONINI <0.30 <0.30 <0.30   BNP (last 3 results)  Recent  Labs  01/05/14 2023  PROBNP 4533.0*   CBG: No results found for this basename: GLUCAP,  in the last 168 hours  Recent Results (from the past 240 hour(s))  CULTURE, BLOOD (ROUTINE X 2)     Status: None   Collection Time    01/05/14  8:43 PM      Result Value Ref Range Status   Specimen Description BLOOD LEFT HAND   Final   Special Requests BOTTLES DRAWN AEROBIC ONLY 5CC   Final   Culture  Setup Time     Final   Value: 01/06/2014 09:09     Performed at [facility]   Culture     Final   Value:        BLOOD CULTURE RECEIVED NO GROWTH TO DATE CULTURE WILL BE HELD FOR 5 DAYS BEFORE ISSUING A FINAL  NEGATIVE REPORT     Performed at [facility]   Report Status PENDING   Incomplete  CULTURE, BLOOD (ROUTINE X 2)     Status: None   Collection Time    01/05/14  9:50 PM      Result Value Ref Range Status   Specimen Description BLOOD RIGHT ARM   Final   Special Requests BOTTLES DRAWN AEROBIC AND ANAEROBIC 10CC EACH   Final   Culture  Setup Time     Final   Value: 01/06/2014 09:10     Performed at [facility]   Culture     Final   Value:        BLOOD CULTURE RECEIVED NO GROWTH TO DATE CULTURE WILL BE HELD FOR 5 DAYS BEFORE ISSUING A FINAL NEGATIVE REPORT     Performed at [facility]   Report Status PENDING   Incomplete  URINE CULTURE     Status: None   Collection Time    01/06/14  7:00 AM      Result Value Ref Range Status   Specimen Description URINE, CLEAN CATCH   Final   Special Requests NONE   Final   Culture  Setup Time     Final   Value: 01/06/2014 12:53     Performed at [facility]     Final   Value: 60,000 COLONIES/ML     Performed at [facility]   Culture     Final   Value: Multiple bacterial morphotypes present, none predominant. Suggest appropriate recollection if clinically indicated.     Performed at [facility]   Report Status 01/07/2014 FINAL   Final     Studies: Ct Head Wo Contrast  01/08/2014   CLINICAL DATA:  AMS r/o CVA  EXAM: CT HEAD WITHOUT CONTRAST  TECHNIQUE: Contiguous axial images were obtained from the base of the skull through the vertex without intravenous contrast.  COMPARISON:  01/05/2014  FINDINGS: Atherosclerotic and physiologic intracranial calcifications. There is fluid level in the sphenoid sinus as before. Stable small right basal ganglia lacunar infarct. Diffuse parenchymal atrophy. Patchy areas of hypoattenuation in deep and periventricular white matter bilaterally. Negative for acute intracranial hemorrhage, mass lesion, acute infarction, midline shift, or mass-effect. Acute  infarct may be inapparent on noncontrast CT. Ventricles and sulci symmetric. Bone windows demonstrate no focal lesion.  IMPRESSION: 1. Negative for bleed or other acute intracranial process. 2. Sphenoid sinus disease as before.   Electronically Signed   By: Arne Cleveland M.D.   On: 01/08/2014 13:27    Scheduled Meds: . atorvastatin  10 mg Oral q1800  . budesonide-formoterol  2 puff Inhalation BID  . fluconazole  100 mg Oral Daily  . folic acid  1 mg Oral Daily  . ipratropium-albuterol  3 mL Nebulization Once  . ipratropium-albuterol  3 mL Nebulization TID  . latanoprost  1 drop Both Eyes QHS  . levothyroxine  75 mcg Oral QAC breakfast  . metoprolol tartrate  25 mg Oral BID  . nystatin  5 mL Oral QID  . omega-3 acid ethyl esters  2 g Oral Daily  . pantoprazole  40 mg Oral Daily  . piperacillin-tazobactam (ZOSYN)  IV  3.375 g Intravenous Q8H  . polyvinyl alcohol  1 drop Both Eyes BID  . potassium chloride  20 mEq Oral Once  . predniSONE  40 mg Oral Q breakfast  . sodium chloride  3 mL Intravenous Q12H   Continuous Infusions:    Time spent: 35 minutes  Geradine Girt, DO  [facility] Pager [phone]. If 7PM-7AM, please contact night-coverage at [email or web address], password [facility] 01/09/2014, 10:31 AM  LOS: 4 days

## 2014-01-09 NOTE — Progress Notes (Signed)
Speech Language Pathology Treatment: Dysphagia  Patient Details Name: Erica [name] MRN: [id number] DOB: [birth year] Today's Date: 01/09/2014 Time: 2761-4709 SLP Time Calculation (min): 15 min  Assessment / Plan / Recommendation Clinical Impression  Dysphagia treatment during teaspoon sips of thin liquids and consumption of pills with RN.  Pt. exhibited increased lethargy this morning after sitting in chair several hours.  Immediate cough following initial teaspoon sip water and delayed cough x 1 during remainder of session.  Chronic aspiration likely and again educated/reviewed strategies with daughter to decrease aspiration risk.  Continue Dys 1 and thin with double swallows, alternate liquid/solid, liquids via tsp, crush meds.  ST will continue to treat while in hospital.   HPI HPI: 80 year old female with a history of chronic atrial fibrillation, non-small cell lung cancer status post right upper lobe lobectomy, COPD, hypothyroidism, and hyperlipidemia presented to ED after a mechanical fall sustained to her face. Increasing shortness of breath for the past 2-3 days.  CT of the brain was negative for any acute intracranial abnormality.  No hip fx's.  CXR 5/31 revealed No acute cardiopulmonary process.  MD note reveals concern about pulmonary infection.  MBS revealed mild pharyngeal dysphagia and severe cervical esophageal dysphagia with what appears to be a Zenker's diverticulum with backflow to pharynx.    Pertinent Vitals WDL  SLP Plan  Continue with current plan of care    Recommendations Diet recommendations: Dysphagia 1 (puree);Thin liquid Liquids provided via: Teaspoon Medication Administration: Crushed with puree Supervision: Staff to assist with self feeding;Full supervision/cueing for compensatory strategies Compensations: Slow rate;Small sips/bites;Multiple dry swallows after each bite/sip;Follow solids with liquid;Clear throat intermittently Postural Changes and/or Swallow  Maneuvers: Seated upright 90 degrees;Upright 30-60 min after meal              Oral Care Recommendations: Oral care BID Follow up Recommendations: Skilled Nursing facility Plan: Continue with current plan of care    GO     [place] Siren M.Ed [employer] [phone]  01/09/2014

## 2014-01-10 ENCOUNTER — Inpatient Hospital Stay (HOSPITAL_COMMUNITY): Payer: Medicare Other

## 2014-01-10 LAB — CBC
HCT: 34.2 % — ABNORMAL LOW (ref 36.0–46.0)
Hemoglobin: 11.3 g/dL — ABNORMAL LOW (ref 12.0–15.0)
MCH: 30.5 pg (ref 26.0–34.0)
MCHC: 33 g/dL (ref 30.0–36.0)
MCV: 92.4 fL (ref 78.0–100.0)
Platelets: 169 10*3/uL (ref 150–400)
RBC: 3.7 MIL/uL — ABNORMAL LOW (ref 3.87–5.11)
RDW: 13.7 % (ref 11.5–15.5)
WBC: 11.5 10*3/uL — ABNORMAL HIGH (ref 4.0–10.5)

## 2014-01-10 LAB — BASIC METABOLIC PANEL
BUN: 18 mg/dL (ref 6–23)
CHLORIDE: 96 meq/L (ref 96–112)
CO2: 32 mEq/L (ref 19–32)
Calcium: 9.2 mg/dL (ref 8.4–10.5)
Creatinine, Ser: 0.47 mg/dL — ABNORMAL LOW (ref 0.50–1.10)
GFR, EST NON AFRICAN AMERICAN: 84 mL/min — AB (ref >90)
Glucose, Bld: 171 mg/dL — ABNORMAL HIGH (ref 70–99)
Potassium: 3.4 mEq/L — ABNORMAL LOW (ref 3.7–5.3)
Sodium: 141 mEq/L (ref 137–147)

## 2014-01-10 MED ORDER — MORPHINE SULFATE 2 MG/ML IJ SOLN
2.0000 mg | Freq: Four times a day (QID) | INTRAMUSCULAR | Status: DC | PRN
  Administered 2014-01-10: 2 mg via INTRAVENOUS

## 2014-01-10 MED ORDER — MORPHINE SULFATE 2 MG/ML IJ SOLN
INTRAMUSCULAR | Status: AC
  Filled 2014-01-10: qty 1

## 2014-01-10 MED ORDER — IPRATROPIUM-ALBUTEROL 0.5-2.5 (3) MG/3ML IN SOLN: 3.0000 mL | RESPIRATORY_TRACT | Status: DC | PRN

## 2014-01-10 MED ORDER — ALBUTEROL SULFATE (2.5 MG/3ML) 0.083% IN NEBU: 2.5000 mg | INHALATION_SOLUTION | RESPIRATORY_TRACT | Status: DC | PRN

## 2014-01-10 MED ORDER — FUROSEMIDE 40 MG PO TABS
40.0000 mg | ORAL_TABLET | Freq: Every day | ORAL | Status: DC
  Administered 2014-01-10 – 2014-01-13 (×4): 40 mg via ORAL
  Filled 2014-01-10 (×4): qty 1

## 2014-01-10 MED ORDER — IPRATROPIUM-ALBUTEROL 0.5-2.5 (3) MG/3ML IN SOLN
3.0000 mL | Freq: Three times a day (TID) | RESPIRATORY_TRACT | Status: DC
  Administered 2014-01-10 – 2014-01-13 (×9): 3 mL via RESPIRATORY_TRACT
  Filled 2014-01-10 (×9): qty 3

## 2014-01-10 MED ORDER — METHYLPREDNISOLONE SODIUM SUCC 125 MG IJ SOLR
60.0000 mg | Freq: Once | INTRAMUSCULAR | Status: AC
  Administered 2014-01-10: 60 mg via INTRAVENOUS
  Filled 2014-01-10: qty 0.96

## 2014-01-10 MED ORDER — FUROSEMIDE 10 MG/ML IJ SOLN
40.0000 mg | Freq: Once | INTRAMUSCULAR | Status: AC
  Administered 2014-01-10: 40 mg via INTRAVENOUS
  Filled 2014-01-10: qty 4

## 2014-01-10 MED ORDER — POTASSIUM CHLORIDE CRYS ER 20 MEQ PO TBCR
40.0000 meq | EXTENDED_RELEASE_TABLET | Freq: Every day | ORAL | Status: DC
  Administered 2014-01-10 – 2014-01-13 (×4): 40 meq via ORAL
  Filled 2014-01-10 (×5): qty 2

## 2014-01-10 MED ORDER — IPRATROPIUM-ALBUTEROL 0.5-2.5 (3) MG/3ML IN SOLN
3.0000 mL | RESPIRATORY_TRACT | Status: DC
  Administered 2014-01-10 (×2): 3 mL via RESPIRATORY_TRACT
  Filled 2014-01-10 (×2): qty 3

## 2014-01-10 MED ORDER — IPRATROPIUM-ALBUTEROL 0.5-2.5 (3) MG/3ML IN SOLN: 3.0000 mL | Freq: Once | RESPIRATORY_TRACT | Status: DC

## 2014-01-10 MED ORDER — LEVALBUTEROL HCL 0.63 MG/3ML IN NEBU: 0.6300 mg | INHALATION_SOLUTION | RESPIRATORY_TRACT | Status: DC | PRN

## 2014-01-10 MED ORDER — VALPROATE SODIUM 500 MG/5ML IV SOLN
250.0000 mg | Freq: Two times a day (BID) | INTRAVENOUS | Status: DC
  Administered 2014-01-10 – 2014-01-12 (×5): 250 mg via INTRAVENOUS
  Filled 2014-01-10 (×8): qty 2.5

## 2014-01-10 NOTE — Progress Notes (Addendum)
Patient having difficulty breathing. Mouth breathing, assessment of wheezing, rhonchi. Pt 95% on RA. Received breathing tx at 2300 with no improvement. MD notified. New orders entered. Pt placed on 30% venti mask per MD.   Erica [name]

## 2014-01-10 NOTE — Progress Notes (Signed)
[facility] PROGRESS NOTE  Erica [name] KNL:[id number] DOB: 1921-03-22 DOA: 01/05/2014 PCP: Redge Gainer, MD  Assessment/Plan:   Atrial fibrillation with RVR:  -resume PO BB -d/c gtt  Patient is not a candidate for Coumadin any longer, given the severity of her fall, advanced age.   AMS (acute metabolic encephalopathy) -? From infection vs concussion -continue to monitor -seen by neuro    Fever, unspecified:  -?PNA ABX -?aspiration- change to zosyn  Dysphagia -diet dys 1    Hypothyroidism:  TSH normal    COPD exacerbation:  wean steroids and bronchodilators. -add mucinex -lasix daily for now    Facial hematoma:  No fractures.    Hypokalemia:  Replete IV    Thrombocytopenia, unspecified:  -B12 and folate ok    Hematoma of right thigh  Thrush -nystatin -diflucan    Cough: See above    Essential hypertension, benign: Blood pressure borderline low currently. See above.  SNF.  Elevated pro BNP: echocardiogram ok. lasix  Code Status:  DO NOT RESUSCITATE Family Communication:  Daughter at bedside Disposition Plan:  SNF  Consultants:    Procedures:       HPI/Subjective: Per daughter before admission was driving and paying her own bills Will answer daughter when she calls her mom but claims not to know who she is   Objective: Filed Vitals:   01/10/14 0403  BP: 111/53  Pulse: 73  Temp: 97.5 F (36.4 C)  Resp: 20    Intake/Output Summary (Last 24 hours) at 01/10/14 1428 Last data filed at 01/10/14 1110  Gross per 24 hour  Intake      3 ml  Output   1400 ml  Net  -1397 ml   Filed Weights   01/05/14 1244 01/05/14 1750  Weight: 61.236 kg (135 lb) 55.8 kg (123 lb 0.3 oz)   Telemetry: Atrial fibrillation  Exam:   General:  Elderly frail female alert oriented and smiling- oriented to person but not place/time  HEENT: . Massive right facial and periorbita full hematoma with conjunctival hemorrhage. Visual Acuity normal, and  extraocular movements intact.  Cardiovascular: Irregularly irregular without murmurs gallops rubs  Respiratory: Rhonchi and please and prolonged expiratory phase with tachypnea  Abdomen: Soft nontender nondistended  Ext: Large right thigh hematoma no edema peripherally   Basic Metabolic Panel:  Recent Labs Lab 01/05/14 1251 01/06/14 0017 01/07/14 0400 01/08/14 0415 01/09/14 0345 01/10/14 0420  NA 141 142 138 140 141 141  K 3.3* 2.6* 3.2* 3.6* 3.4* 3.4*  CL 100 100 97 99 101 96  CO2 27 27 28 30 31  32  GLUCOSE 122* 137* 157* 137* 134* 171*  BUN 21 21 30* 28* 20 18  CREATININE 0.41* 0.48* 0.53 0.50 0.43* 0.47*  CALCIUM 8.5 8.1* 8.6 8.6 8.7 9.2  MG  --  1.7  --   --   --   --    Liver Function Tests: No results found for this basename: AST, ALT, ALKPHOS, BILITOT, PROT, ALBUMIN,  in the last 168 hours No results found for this basename: LIPASE, AMYLASE,  in the last 168 hours No results found for this basename: AMMONIA,  in the last 168 hours CBC:  Recent Labs Lab 01/06/14 0017 01/07/14 0400 01/08/14 0415 01/09/14 0345 01/10/14 0420  WBC 3.7* 6.8 11.8* 11.6* 11.5*  HGB 11.0* 10.0* 10.1* 10.2* 11.3*  HCT 32.0* 29.3* 30.4* 31.2* 34.2*  MCV 91.2 89.6 91.0 92.3 92.4  PLT 60* 70* 98* 117* 169   Cardiac Enzymes:  Recent Labs Lab 01/05/14 2023 01/06/14 0017 01/06/14 0907  TROPONINI <0.30 <0.30 <0.30   BNP (last 3 results)  Recent Labs  01/05/14 2023  PROBNP 4533.0*   CBG: No results found for this basename: GLUCAP,  in the last 168 hours  Recent Results (from the past 240 hour(s))  CULTURE, BLOOD (ROUTINE X 2)     Status: None   Collection Time    01/05/14  8:43 PM      Result Value Ref Range Status   Specimen Description BLOOD LEFT HAND   Final   Special Requests BOTTLES DRAWN AEROBIC ONLY 5CC   Final   Culture  Setup Time     Final   Value: 01/06/2014 09:09     Performed at [facility]   Culture     Final   Value:        BLOOD CULTURE  RECEIVED NO GROWTH TO DATE CULTURE WILL BE HELD FOR 5 DAYS BEFORE ISSUING A FINAL NEGATIVE REPORT     Performed at [facility]   Report Status PENDING   Incomplete  CULTURE, BLOOD (ROUTINE X 2)     Status: None   Collection Time    01/05/14  9:50 PM      Result Value Ref Range Status   Specimen Description BLOOD RIGHT ARM   Final   Special Requests BOTTLES DRAWN AEROBIC AND ANAEROBIC 10CC EACH   Final   Culture  Setup Time     Final   Value: 01/06/2014 09:10     Performed at [facility]   Culture     Final   Value:        BLOOD CULTURE RECEIVED NO GROWTH TO DATE CULTURE WILL BE HELD FOR 5 DAYS BEFORE ISSUING A FINAL NEGATIVE REPORT     Performed at [facility]   Report Status PENDING   Incomplete  URINE CULTURE     Status: None   Collection Time    01/06/14  7:00 AM      Result Value Ref Range Status   Specimen Description URINE, CLEAN CATCH   Final   Special Requests NONE   Final   Culture  Setup Time     Final   Value: 01/06/2014 12:53     Performed at [place]     Final   Value: 60,000 COLONIES/ML     Performed at [facility]   Culture     Final   Value: Multiple bacterial morphotypes present, none predominant. Suggest appropriate recollection if clinically indicated.     Performed at [facility]   Report Status 01/07/2014 FINAL   Final     Studies: Dg Chest Port 1 View  01/10/2014   CLINICAL DATA:  Short of breath.  Wheezing  EXAM: PORTABLE CHEST - 1 VIEW  COMPARISON:  01/06/2014  FINDINGS: Moderate enlargement of the cardiopericardial silhouette. No convincing mediastinal or hilar masses.  There is bilateral interstitial thickening. No lung consolidation. No convincing effusion no pneumothorax.  Bony thorax is diffusely demineralized.  IMPRESSION: 1. Moderate enlargement of the cardiopericardial silhouette, stable. 2. Bilateral interstitial thickening but no areas of lung confluent opacity. Mild congestive  heart failure is fell likely given history shortness of breath. No evidence of pneumonia.   Electronically Signed   By: Lajean Manes M.D.   On: 01/10/2014 01:43    Scheduled Meds: . atorvastatin  10 mg Oral q1800  . budesonide-formoterol  2 puff Inhalation BID  . fluconazole  100 mg Oral Daily  . folic acid  1 mg Oral Daily  . furosemide  40 mg Oral Daily  . ipratropium-albuterol  3 mL Nebulization TID  . latanoprost  1 drop Both Eyes QHS  . levothyroxine  75 mcg Oral QAC breakfast  . metoprolol tartrate  25 mg Oral BID  . nystatin  5 mL Oral QID  . omega-3 acid ethyl esters  2 g Oral Daily  . pantoprazole  40 mg Oral Daily  . piperacillin-tazobactam (ZOSYN)  IV  3.375 g Intravenous Q8H  . polyvinyl alcohol  1 drop Both Eyes BID  . potassium chloride  40 mEq Oral Daily  . predniSONE  40 mg Oral Q breakfast  . sodium chloride  3 mL Intravenous Q12H  . valproate sodium  250 mg Intravenous Q12H   Continuous Infusions:    Time spent: 35 minutes  Geradine Girt, DO  [facility] Pager [phone]. If 7PM-7AM, please contact night-coverage at [email or web address], password [facility] 01/10/2014, 2:28 PM  LOS: 5 days

## 2014-01-10 NOTE — Progress Notes (Signed)
ANTIBIOTIC CONSULT NOTE   Pharmacy Consult for zosyn Indication: aspiration PNA  No Known Allergies  Patient Measurements: Height: 5\' 9"  (175.3 cm) Weight: 123 lb 0.3 oz (55.8 kg) IBW/kg (Calculated) : 66.2   Vital Signs: Temp: 97.5 F (36.4 C) (06/05 0403) Temp src: Oral (06/05 0403) BP: 111/53 mmHg (06/05 0403) Pulse Rate: 73 (06/05 0403) Intake/Output from previous day: 06/04 0701 - 06/05 0700 In: 323 [P.O.:270; I.V.:3; IV Piggyback:50] Out: 1100 [Urine:1100] Intake/Output from this shift:    Labs:  Recent Labs  01/08/14 0415 01/09/14 0345 01/10/14 0420  WBC 11.8* 11.6* 11.5*  HGB 10.1* 10.2* 11.3*  PLT 98* 117* 169  CREATININE 0.50 0.43* 0.47*   Estimated Creatinine Clearance: 41.2 ml/min (by C-G formula based on Cr of 0.47). No results found for this basename: VANCOTROUGH, VANCOPEAK, VANCORANDOM, [place], [place], GENTRANDOM, TOBRATROUGH, TOBRAPEAK, TOBRARND, AMIKACINPEAK, AMIKACINTROU, AMIKACIN,  in the last 72 hours   Microbiology: Recent Results (from the past 720 hour(s))  CULTURE, BLOOD (ROUTINE X 2)     Status: None   Collection Time    01/05/14  8:43 PM      Result Value Ref Range Status   Specimen Description BLOOD LEFT HAND   Final   Special Requests BOTTLES DRAWN AEROBIC ONLY 5CC   Final   Culture  Setup Time     Final   Value: 01/06/2014 09:09     Performed at [facility]   Culture     Final   Value:        BLOOD CULTURE RECEIVED NO GROWTH TO DATE CULTURE WILL BE HELD FOR 5 DAYS BEFORE ISSUING A FINAL NEGATIVE REPORT     Performed at [facility]   Report Status PENDING   Incomplete  CULTURE, BLOOD (ROUTINE X 2)     Status: None   Collection Time    01/05/14  9:50 PM      Result Value Ref Range Status   Specimen Description BLOOD RIGHT ARM   Final   Special Requests BOTTLES DRAWN AEROBIC AND ANAEROBIC 10CC EACH   Final   Culture  Setup Time     Final   Value: 01/06/2014 09:10     Performed at [facility]   Culture     Final   Value:        BLOOD CULTURE RECEIVED NO GROWTH TO DATE CULTURE WILL BE HELD FOR 5 DAYS BEFORE ISSUING A FINAL NEGATIVE REPORT     Performed at [facility]   Report Status PENDING   Incomplete  URINE CULTURE     Status: None   Collection Time    01/06/14  7:00 AM      Result Value Ref Range Status   Specimen Description URINE, CLEAN CATCH   Final   Special Requests NONE   Final   Culture  Setup Time     Final   Value: 01/06/2014 12:53     Performed at [place]     Final   Value: 60,000 COLONIES/ML     Performed at [facility]   Culture     Final   Value: Multiple bacterial morphotypes present, none predominant. Suggest appropriate recollection if clinically indicated.     Performed at [facility]   Report Status 01/07/2014 FINAL   Final   Assessment: 89 YOF admitted s/p fall with facial and right thigh hematoma. Started on Zosyn for suspected aspiration PNA, today D#4. SCr stable  at 0.47, est CrCl ~20mL/min. WBC still elevated, but patient is on steroids. No significant growth on cultures.   Plan:  1. Continue Zosyn 3.375gm IV q8h (infuse over 4 hours) 2. Follow c/s, LOT, clinical progression, renal function  Cristo Ausburn D. Burdette Forehand, PharmD, BCPS Clinical Pharmacist Pager: [phone] 01/10/2014 11:21 AM

## 2014-01-10 NOTE — Progress Notes (Signed)
Occupational Therapy Treatment Patient Details Name: Erica [name] MRN: [id number] DOB: 04-Aug-1921 Today's Date: 01/10/2014    History of present illness Pt admitted after fall at home with significant right sided face and thigh hematoma without fx. Pt stayed down 4 hrs until family arrived to visit because she didnt' want to call and bother anyone despite presence of life alert. CT of head negative and all xrays of hip and face negative for fx Pt with significant AMS (not recalling family members, reason for hospitalization)--previously very sharp-minded & living alone per family. Neuro consult favors AMS due to infection/pneumonia (vs ?TBI). Pt with hx of non-small cell lung cancer status post right upper lobe lobectomy, COPD, hypothyroidism, and hyperlipidemia   OT comments  Pt very restless this session and demonstrates Rancho V behavior. Pt at baseline was fully independent per family and even drove a car. Pt is very far from baseline this session. Pt with incr HR at EOB and immediate desire to return to supine.    Follow Up Recommendations  SNF    Equipment Recommendations   (defer SNF)    Recommendations for Other Services      Precautions / Restrictions Precautions Precautions: Fall       Mobility Bed Mobility Overal bed mobility: Needs Assistance Bed Mobility: Supine to Sit;Sit to Supine     Supine to sit: Max assist Sit to supine: Mod assist   General bed mobility comments: requires (A) to progress trunk to full upright posture. pt immediately attmepting return to supine.   Transfers                      Balance Overall balance assessment: Needs assistance Sitting-balance support: Bilateral upper extremity supported;Feet supported Sitting balance-Leahy Scale: Zero                             ADL Overall ADL's : Needs assistance/impaired                                       General ADL Comments: Pt restless on arrival. pt  long sitting in bed repeating the same comment "hey how you doing?" and returning to supine. Pt with mouth swallow breath breathing. pt needed max encouragement for EOB.pt max (A) supine<>Sit Eob. pt attempting to return supine immediately. Ot sitting EOB shoulder to shoulder to keep pt EOB sitting> HR 125 sustain. pt completed flutter valve x6 reps iwth max encouragement and verbal cue "to blow the horn" pt restless with no objective reason for return to supine.      Vision                     Perception     Praxis      Cognition   Behavior During Therapy: Restless Overall Cognitive Status: Impaired/Different from baseline Area of Impairment: Orientation;Attention;Following commands;Safety/judgement;Rancho level Orientation Level: Disoriented to;Person;Place;Time;Situation Current Attention Level: Focused Memory: Decreased short-term memory  Following Commands: Follows one step commands inconsistently;Follows one step commands with increased time Safety/Judgement: Decreased awareness of deficits Awareness: Intellectual Problem Solving: Slow processing;Decreased initiation;Difficulty sequencing General Comments: pt sitting up "hey how you doing". Pt not recognizing family. pt at baseline driving, cooking , cleaning and fully independent. This is a cogntiive change from baseline.    Extremity/Trunk Assessment  Exercises     Shoulder Instructions       General Comments      Pertinent Vitals/ Pain       HR 125        RN in room to provided medications at end of session  Home Living                                          Prior Functioning/Environment              Frequency Min 2X/week     Progress Toward Goals  OT Goals(current goals can now be found in the care plan section)  Progress towards OT goals: Not progressing toward goals - comment  Acute Rehab OT Goals Patient Stated Goal: none stated OT Goal Formulation:  With patient Time For Goal Achievement: 01/21/14 Potential to Achieve Goals: Good ADL Goals Pt Will Perform Grooming: with min assist;sitting Pt Will Perform Upper Body Bathing: with min assist;sitting Pt Will Transfer to Toilet: with mod assist;ambulating Additional ADL Goal #1: Pt will sustain attention to task for 45 seconds   Plan Discharge plan remains appropriate    Co-evaluation                 End of Session     Activity Tolerance Patient limited by fatigue   Patient Left in bed;with call bell/phone within reach;with chair alarm set;with nursing/sitter in room   Nurse Communication Mobility status;Precautions        Time: 6160-7371 OT Time Calculation (min): 12 min  Charges: OT General Charges $OT Visit: 1 Procedure OT Treatments $Self Care/Home Management : 8-22 mins  Peri Maris 01/10/2014, 11:50 AM Pager: [phone]

## 2014-01-10 NOTE — Progress Notes (Addendum)
RN paged NP secondary to pt having difficulty breathing-mouth breathing, audible wheezing, rhonchi. Satting fine on 2L [place] but difficult to keep on pt secondary to hx of dementia. Had neb at 2300 with no relief. NP ordered Lasix 40mg  IV, r/p albuterol neb, Solumedrol 60mg  IV, change to ventimask and NP to floor.  S: pt endorsed SOB, some centralized chest pain. No radiation of pain. Per daughter, pt gets restless, difficult to keep O2 on. Was breathing better earlier 01/09/14.  O: Frail, elderly WF in mild resp distress. Hematoma noted to face/neck from a fall. Card: irreg irreg. RR 25 with slight increased WOB, audible wheezing. Lungs: wheezing to all lobes, mild rhonchi.  A/P: 1. COPD exacerbation, acute respiratory difficulty-steroids were weaned today, gave Solumedrol 60mg  IV x 2. Albuterol neb, ventimask, O2 sats normal, Morphine to help relieve distress. CXR showed evidence of CHF without pulmonary edema, improvement in PNA.  2. Elevated BNP on admission and CHF on Xray-gave Lasix x 1. (creat OK).  3. Dementia/restlessness-Morphine, SR up x 4-OK'd use per daughter.  4. Chest pain-likely pleuritic and from coughing-troponins have been neg. On Mucinex.  5. PAF-change albuterol to xopenex. Cardizem gtt d/c'd today. On BB. No anticoagulation 2/2 fall.  6. PNA-contributor to resp issues. Continue abx.  Plan of tx discussed with daughter, Arville Go, at bedside. DNR.  Will continue to follow.  Clance Boll, NP [facility] Update: pt doing much better now. KJKG, NP

## 2014-01-11 LAB — BASIC METABOLIC PANEL
BUN: 16 mg/dL (ref 6–23)
CALCIUM: 9.1 mg/dL (ref 8.4–10.5)
CO2: 31 mEq/L (ref 19–32)
CREATININE: 0.49 mg/dL — AB (ref 0.50–1.10)
Chloride: 98 mEq/L (ref 96–112)
GFR, EST NON AFRICAN AMERICAN: 83 mL/min — AB (ref >90)
Glucose, Bld: 147 mg/dL — ABNORMAL HIGH (ref 70–99)
Potassium: 3.4 mEq/L — ABNORMAL LOW (ref 3.7–5.3)
Sodium: 142 mEq/L (ref 137–147)

## 2014-01-11 LAB — CBC
HCT: 34.5 % — ABNORMAL LOW (ref 36.0–46.0)
Hemoglobin: 11.3 g/dL — ABNORMAL LOW (ref 12.0–15.0)
MCH: 30 pg (ref 26.0–34.0)
MCHC: 32.8 g/dL (ref 30.0–36.0)
MCV: 91.5 fL (ref 78.0–100.0)
PLATELETS: 224 10*3/uL (ref 150–400)
RBC: 3.77 MIL/uL — ABNORMAL LOW (ref 3.87–5.11)
RDW: 13.3 % (ref 11.5–15.5)
WBC: 14.2 10*3/uL — ABNORMAL HIGH (ref 4.0–10.5)

## 2014-01-11 MED ORDER — HALOPERIDOL 0.5 MG PO TABS
0.5000 mg | ORAL_TABLET | Freq: Four times a day (QID) | ORAL | Status: DC | PRN
  Filled 2014-01-11: qty 1

## 2014-01-11 MED ORDER — METOPROLOL TARTRATE 25 MG PO TABS
37.5000 mg | ORAL_TABLET | Freq: Two times a day (BID) | ORAL | Status: DC
  Administered 2014-01-11 – 2014-01-13 (×4): 37.5 mg via ORAL
  Filled 2014-01-11 (×5): qty 1

## 2014-01-11 MED ORDER — METOPROLOL TARTRATE 25 MG PO TABS
37.5000 mg | ORAL_TABLET | Freq: Once | ORAL | Status: AC
  Administered 2014-01-11: 37.5 mg via ORAL
  Filled 2014-01-11: qty 1

## 2014-01-11 NOTE — Plan of Care (Signed)
Problem: Phase III Progression Outcomes Goal: Activity at appropriate level-compared to baseline (UP IN CHAIR FOR HEMODIALYSIS)  Outcome: Not Met (add Reason) Patient independent prior to admission. Pt to dc to SNF

## 2014-01-11 NOTE — Progress Notes (Signed)
[facility] PROGRESS NOTE  Erica [name] QIH:[id number] DOB: [birth year] DOA: 01/05/2014 PCP: Redge Gainer, MD  Assessment/Plan:   Atrial fibrillation with RVR:  -resume PO BB -d/c gtt  Patient is not a candidate for Coumadin any longer, given the severity of her fall, advanced age.   AMS (acute metabolic encephalopathy) -? From infection vs concussion -continue to monitor -seen by neuro -try depakote and PRN haldol    Fever, unspecified:  -?PNA ABX IV- change to PO in AM- for total of 8 days -?aspiration- zosyn  Dysphagia -diet dys 1    Hypothyroidism:  TSH normal    COPD exacerbation:  wean steroids and bronchodilators. -add mucinex -lasix daily for now    Facial hematoma:  No fractures.    Hypokalemia:  Replete IV    Thrombocytopenia, unspecified:  -B12 and folate ok    Hematoma of right thigh  Thrush -nystatin -diflucan    Cough: See above    Essential hypertension, benign: Blood pressure borderline low currently. See above.  SNF.  Elevated pro BNP: echocardiogram ok. lasix  Code Status:  DO NOT RESUSCITATE Family Communication:  Daughter at bedside Disposition Plan:  SNF Monday  Consultants:    Procedures:       HPI/Subjective: Per daughter before admission was driving and paying her own bills  Follow commands Appears slow to respond   Objective: Filed Vitals:   01/11/14 0443  BP: 136/93  Pulse: 102  Temp: 97.8 F (36.6 C)  Resp: 18    Intake/Output Summary (Last 24 hours) at 01/11/14 1131 Last data filed at 01/10/14 2328  Gross per 24 hour  Intake      0 ml  Output    450 ml  Net   -450 ml   Filed Weights   01/05/14 1244 01/05/14 1750  Weight: 61.236 kg (135 lb) 55.8 kg (123 lb 0.3 oz)   Telemetry: Atrial fibrillation  Exam:   General:  Elderly frail female alert oriented and smiling- oriented to person but not place/time  HEENT: . Massive right facial and periorbita full hematoma with conjunctival  hemorrhage. Visual Acuity normal, and extraocular movements intact.  Cardiovascular: Irregularly irregular without murmurs gallops rubs  Respiratory: Rhonchi and please and prolonged expiratory phase with tachypnea  Abdomen: Soft nontender nondistended  Ext: Large right thigh hematoma no edema peripherally   Basic Metabolic Panel:  Recent Labs Lab 01/05/14 1251 01/06/14 0017 01/07/14 0400 01/08/14 0415 01/09/14 0345 01/10/14 0420 01/11/14 0355  NA 141 142 138 140 141 141 142  K 3.3* 2.6* 3.2* 3.6* 3.4* 3.4* 3.4*  CL 100 100 97 99 101 96 98  CO2 27 27 28 30 31  32 31  GLUCOSE 122* 137* 157* 137* 134* 171* 147*  BUN 21 21 30* 28* 20 18 16   CREATININE 0.41* 0.48* 0.53 0.50 0.43* 0.47* 0.49*  CALCIUM 8.5 8.1* 8.6 8.6 8.7 9.2 9.1  MG  --  1.7  --   --   --   --   --    Liver Function Tests: No results found for this basename: AST, ALT, ALKPHOS, BILITOT, PROT, ALBUMIN,  in the last 168 hours No results found for this basename: LIPASE, AMYLASE,  in the last 168 hours No results found for this basename: AMMONIA,  in the last 168 hours CBC:  Recent Labs Lab 01/07/14 0400 01/08/14 0415 01/09/14 0345 01/10/14 0420 01/11/14 0355  WBC 6.8 11.8* 11.6* 11.5* 14.2*  HGB 10.0* 10.1* 10.2* 11.3* 11.3*  HCT 29.3* 30.4*  31.2* 34.2* 34.5*  MCV 89.6 91.0 92.3 92.4 91.5  PLT 70* 98* 117* 169 224   Cardiac Enzymes:  Recent Labs Lab 01/05/14 2023 01/06/14 0017 01/06/14 0907  TROPONINI <0.30 <0.30 <0.30   BNP (last 3 results)  Recent Labs  01/05/14 2023  PROBNP 4533.0*   CBG: No results found for this basename: GLUCAP,  in the last 168 hours  Recent Results (from the past 240 hour(s))  CULTURE, BLOOD (ROUTINE X 2)     Status: None   Collection Time    01/05/14  8:43 PM      Result Value Ref Range Status   Specimen Description BLOOD LEFT HAND   Final   Special Requests BOTTLES DRAWN AEROBIC ONLY 5CC   Final   Culture  Setup Time     Final   Value: 01/06/2014 09:09      Performed at [facility]   Culture     Final   Value:        BLOOD CULTURE RECEIVED NO GROWTH TO DATE CULTURE WILL BE HELD FOR 5 DAYS BEFORE ISSUING A FINAL NEGATIVE REPORT     Performed at [facility]   Report Status PENDING   Incomplete  CULTURE, BLOOD (ROUTINE X 2)     Status: None   Collection Time    01/05/14  9:50 PM      Result Value Ref Range Status   Specimen Description BLOOD RIGHT ARM   Final   Special Requests BOTTLES DRAWN AEROBIC AND ANAEROBIC 10CC EACH   Final   Culture  Setup Time     Final   Value: 01/06/2014 09:10     Performed at [facility]   Culture     Final   Value:        BLOOD CULTURE RECEIVED NO GROWTH TO DATE CULTURE WILL BE HELD FOR 5 DAYS BEFORE ISSUING A FINAL NEGATIVE REPORT     Performed at [facility]   Report Status PENDING   Incomplete  URINE CULTURE     Status: None   Collection Time    01/06/14  7:00 AM      Result Value Ref Range Status   Specimen Description URINE, CLEAN CATCH   Final   Special Requests NONE   Final   Culture  Setup Time     Final   Value: 01/06/2014 12:53     Performed at [place]     Final   Value: 60,000 COLONIES/ML     Performed at [facility]   Culture     Final   Value: Multiple bacterial morphotypes present, none predominant. Suggest appropriate recollection if clinically indicated.     Performed at [facility]   Report Status 01/07/2014 FINAL   Final     Studies: Dg Chest Port 1 View  01/10/2014   CLINICAL DATA:  Short of breath.  Wheezing  EXAM: PORTABLE CHEST - 1 VIEW  COMPARISON:  01/06/2014  FINDINGS: Moderate enlargement of the cardiopericardial silhouette. No convincing mediastinal or hilar masses.  There is bilateral interstitial thickening. No lung consolidation. No convincing effusion no pneumothorax.  Bony thorax is diffusely demineralized.  IMPRESSION: 1. Moderate enlargement of the cardiopericardial silhouette, stable. 2.  Bilateral interstitial thickening but no areas of lung confluent opacity. Mild congestive heart failure is fell likely given history shortness of breath. No evidence of pneumonia.   Electronically Signed   By: Lajean Manes  M.D.   On: 01/10/2014 01:43    Scheduled Meds: . atorvastatin  10 mg Oral q1800  . budesonide-formoterol  2 puff Inhalation BID  . fluconazole  100 mg Oral Daily  . folic acid  1 mg Oral Daily  . furosemide  40 mg Oral Daily  . ipratropium-albuterol  3 mL Nebulization TID  . latanoprost  1 drop Both Eyes QHS  . levothyroxine  75 mcg Oral QAC breakfast  . metoprolol tartrate  37.5 mg Oral BID  . nystatin  5 mL Oral QID  . omega-3 acid ethyl esters  2 g Oral Daily  . pantoprazole  40 mg Oral Daily  . piperacillin-tazobactam (ZOSYN)  IV  3.375 g Intravenous Q8H  . polyvinyl alcohol  1 drop Both Eyes BID  . potassium chloride  40 mEq Oral Daily  . predniSONE  40 mg Oral Q breakfast  . sodium chloride  3 mL Intravenous Q12H  . valproate sodium  250 mg Intravenous Q12H   Continuous Infusions:    Time spent: 35 minutes  Geradine Girt, DO  [facility] Pager [phone]. If 7PM-7AM, please contact night-coverage at [email or web address], password [facility] 01/11/2014, 11:31 AM  LOS: 6 days

## 2014-01-12 DIAGNOSIS — J189 Pneumonia, unspecified organism: Secondary | ICD-10-CM

## 2014-01-12 LAB — CBC
HCT: 34.5 % — ABNORMAL LOW (ref 36.0–46.0)
Hemoglobin: 11.2 g/dL — ABNORMAL LOW (ref 12.0–15.0)
MCH: 29.3 pg (ref 26.0–34.0)
MCHC: 32.5 g/dL (ref 30.0–36.0)
MCV: 90.3 fL (ref 78.0–100.0)
PLATELETS: 234 10*3/uL (ref 150–400)
RBC: 3.82 MIL/uL — AB (ref 3.87–5.11)
RDW: 13.7 % (ref 11.5–15.5)
WBC: 12.2 10*3/uL — ABNORMAL HIGH (ref 4.0–10.5)

## 2014-01-12 LAB — CULTURE, BLOOD (ROUTINE X 2)
CULTURE: NO GROWTH
CULTURE: NO GROWTH

## 2014-01-12 LAB — BASIC METABOLIC PANEL
BUN: 23 mg/dL (ref 6–23)
CHLORIDE: 102 meq/L (ref 96–112)
CO2: 31 meq/L (ref 19–32)
CREATININE: 0.54 mg/dL (ref 0.50–1.10)
Calcium: 8.8 mg/dL (ref 8.4–10.5)
GFR calc non Af Amer: 81 mL/min — ABNORMAL LOW (ref >90)
Glucose, Bld: 134 mg/dL — ABNORMAL HIGH (ref 70–99)
POTASSIUM: 3.4 meq/L — AB (ref 3.7–5.3)
SODIUM: 144 meq/L (ref 137–147)

## 2014-01-12 NOTE — Progress Notes (Signed)
ANTIBIOTIC CONSULT NOTE   Pharmacy Consult for zosyn Indication: aspiration PNA  No Known Allergies  Patient Measurements: Height: 5\' 9"  (175.3 cm) Weight: 123 lb 0.3 oz (55.8 kg) IBW/kg (Calculated) : 66.2   Vital Signs: Temp: 97.8 F (36.6 C) (06/07 0415) Temp src: Axillary (06/07 0415) BP: 112/79 mmHg (06/07 0415) Pulse Rate: 108 (06/07 0415) Intake/Output from previous day: 06/06 0701 - 06/07 0700 In: -  Out: 900 [Urine:900] Intake/Output from this shift:    Labs:  Recent Labs  01/10/14 0420 01/11/14 0355 01/12/14 0330  WBC 11.5* 14.2* 12.2*  HGB 11.3* 11.3* 11.2*  PLT 169 224 234  CREATININE 0.47* 0.49* 0.54   Estimated Creatinine Clearance: 41.2 ml/min (by C-G formula based on Cr of 0.54). No results found for this basename: VANCOTROUGH, VANCOPEAK, VANCORANDOM, [place], GENTPEAK, GENTRANDOM, [place], TOBRAPEAK, TOBRARND, AMIKACINPEAK, AMIKACINTROU, AMIKACIN,  in the last 72 hours   Microbiology: Recent Results (from the past 720 hour(s))  CULTURE, BLOOD (ROUTINE X 2)     Status: None   Collection Time    01/05/14  8:43 PM      Result Value Ref Range Status   Specimen Description BLOOD LEFT HAND   Final   Special Requests BOTTLES DRAWN AEROBIC ONLY 5CC   Final   Culture  Setup Time     Final   Value: 01/06/2014 09:09     Performed at [facility]   Culture     Final   Value: NO GROWTH 5 DAYS     Performed at [facility]   Report Status 01/12/2014 FINAL   Final  CULTURE, BLOOD (ROUTINE X 2)     Status: None   Collection Time    01/05/14  9:50 PM      Result Value Ref Range Status   Specimen Description BLOOD RIGHT ARM   Final   Special Requests BOTTLES DRAWN AEROBIC AND ANAEROBIC 10CC EACH   Final   Culture  Setup Time     Final   Value: 01/06/2014 09:10     Performed at [facility]   Culture     Final   Value: NO GROWTH 5 DAYS     Performed at [facility]   Report Status 01/12/2014 FINAL   Final  URINE  CULTURE     Status: None   Collection Time    01/06/14  7:00 AM      Result Value Ref Range Status   Specimen Description URINE, CLEAN CATCH   Final   Special Requests NONE   Final   Culture  Setup Time     Final   Value: 01/06/2014 12:53     Performed at [place]     Final   Value: 60,000 COLONIES/ML     Performed at [facility]   Culture     Final   Value: Multiple bacterial morphotypes present, none predominant. Suggest appropriate recollection if clinically indicated.     Performed at [facility]   Report Status 01/07/2014 FINAL   Final   Assessment: 3 YOF admitted s/p fall with facial and right thigh hematoma. Started on Zosyn for suspected aspiration PNA, today D#6 SCr stable at 0.54, est CrCl ~61mL/min. WBC still slightly elevated, but patient is on steroids. No significant growth on cultures. CXR on 6/5 did not show evidence of PNA. Per notes, planning for 8 days of anticiotics  Plan:  1. Continue Zosyn 3.375gm IV q8h (infuse  over 4 hours) 2. Follow clinical progression, renal function 3. Follow up LOT and stop date being entered  Ymani Porcher D. Lollie Gunner, PharmD, BCPS Clinical Pharmacist Pager: [phone] 01/12/2014 1:18 PM

## 2014-01-12 NOTE — Progress Notes (Signed)
Clinical Social Worker (CSW) contacted patient's daughter Remo Lipps to give bed offers. Daughter chose [place] [facility]. CSW left message with [facility] making them aware of above. Weekday will follow up.   Blima Rich, [place] Weekend CSW [phone]

## 2014-01-12 NOTE — Progress Notes (Signed)
[facility] PROGRESS NOTE  Erica [name] QQI:[id number] DOB: 06-Feb-1921 DOA: 01/05/2014 PCP: Redge Gainer, MD  Assessment/Plan:   Atrial fibrillation with RVR:  -resume PO BB -d/c gtt  Patient is not a candidate for Coumadin any longer, given the severity of her fall, advanced age.   AMS (acute metabolic encephalopathy) -? From infection vs concussion -continue to monitor -seen by neuro - PRN haldol- mental status improved with depakote    Fever, unspecified:  -?PNA ABX IV-  for total of 8 days -?aspiration- zosyn  Dysphagia -diet dys 1    Hypothyroidism:  TSH normal    COPD exacerbation:  wean steroids and bronchodilators. -add mucinex -lasix daily for now    Facial hematoma:  No fractures.    Hypokalemia:  Replete IV    Thrombocytopenia, unspecified:  -B12 and folate ok    Hematoma of right thigh  Thrush -nystatin -diflucan    Cough: See above    Essential hypertension, benign: Blood pressure borderline low currently. See above.  Leukocytosis-  Decreasing  SNF.  Elevated pro BNP: echocardiogram ok. lasix  Code Status:  DO NOT RESUSCITATE Family Communication:  Daughter at bedside Disposition Plan:  SNF Monday  Consultants:  neuro  Procedures:   echo    HPI/Subjective: Per daughter before admission was driving and paying her own bills  mentation better   Objective: Filed Vitals:   01/12/14 0415  BP: 112/79  Pulse: 108  Temp: 97.8 F (36.6 C)  Resp: 22    Intake/Output Summary (Last 24 hours) at 01/12/14 0948 Last data filed at 01/12/14 0500  Gross per 24 hour  Intake      0 ml  Output    900 ml  Net   -900 ml   Filed Weights   01/05/14 1244 01/05/14 1750  Weight: 61.236 kg (135 lb) 55.8 kg (123 lb 0.3 oz)   Telemetry: Atrial fibrillation  Exam:   General:  Elderly frail female alert oriented and smiling- oriented to person but not place/time  HEENT: . Massive right facial and periorbita full hematoma with  conjunctival hemorrhage. Visual Acuity normal, and extraocular movements intact.  Cardiovascular: Irregularly irregular without murmurs gallops rubs  Respiratory: Rhonchi and please and prolonged expiratory phase with tachypnea  Abdomen: Soft nontender nondistended  Ext: Large right thigh hematoma no edema peripherally   Basic Metabolic Panel:  Recent Labs Lab 01/05/14 1251 01/06/14 0017  01/08/14 0415 01/09/14 0345 01/10/14 0420 01/11/14 0355 01/12/14 0330  NA 141 142  < > 140 141 141 142 144  K 3.3* 2.6*  < > 3.6* 3.4* 3.4* 3.4* 3.4*  CL 100 100  < > 99 101 96 98 102  CO2 27 27  < > 30 31 32 31 31  GLUCOSE 122* 137*  < > 137* 134* 171* 147* 134*  BUN 21 21  < > 28* 20 18 16 23   CREATININE 0.41* 0.48*  < > 0.50 0.43* 0.47* 0.49* 0.54  CALCIUM 8.5 8.1*  < > 8.6 8.7 9.2 9.1 8.8  MG  --  1.7  --   --   --   --   --   --   < > = values in this interval not displayed. Liver Function Tests: No results found for this basename: AST, ALT, ALKPHOS, BILITOT, PROT, ALBUMIN,  in the last 168 hours No results found for this basename: LIPASE, AMYLASE,  in the last 168 hours No results found for this basename: AMMONIA,  in the last  168 hours CBC:  Recent Labs Lab 01/08/14 0415 01/09/14 0345 01/10/14 0420 01/11/14 0355 01/12/14 0330  WBC 11.8* 11.6* 11.5* 14.2* 12.2*  HGB 10.1* 10.2* 11.3* 11.3* 11.2*  HCT 30.4* 31.2* 34.2* 34.5* 34.5*  MCV 91.0 92.3 92.4 91.5 90.3  PLT 98* 117* 169 224 234   Cardiac Enzymes:  Recent Labs Lab 01/05/14 2023 01/06/14 0017 01/06/14 0907  TROPONINI <0.30 <0.30 <0.30   BNP (last 3 results)  Recent Labs  01/05/14 2023  PROBNP 4533.0*   CBG: No results found for this basename: GLUCAP,  in the last 168 hours  Recent Results (from the past 240 hour(s))  CULTURE, BLOOD (ROUTINE X 2)     Status: None   Collection Time    01/05/14  8:43 PM      Result Value Ref Range Status   Specimen Description BLOOD LEFT HAND   Final   Special  Requests BOTTLES DRAWN AEROBIC ONLY 5CC   Final   Culture  Setup Time     Final   Value: 01/06/2014 09:09     Performed at [facility]   Culture     Final   Value:        BLOOD CULTURE RECEIVED NO GROWTH TO DATE CULTURE WILL BE HELD FOR 5 DAYS BEFORE ISSUING A FINAL NEGATIVE REPORT     Performed at [facility]   Report Status PENDING   Incomplete  CULTURE, BLOOD (ROUTINE X 2)     Status: None   Collection Time    01/05/14  9:50 PM      Result Value Ref Range Status   Specimen Description BLOOD RIGHT ARM   Final   Special Requests BOTTLES DRAWN AEROBIC AND ANAEROBIC 10CC EACH   Final   Culture  Setup Time     Final   Value: 01/06/2014 09:10     Performed at [facility]   Culture     Final   Value:        BLOOD CULTURE RECEIVED NO GROWTH TO DATE CULTURE WILL BE HELD FOR 5 DAYS BEFORE ISSUING A FINAL NEGATIVE REPORT     Performed at [facility]   Report Status PENDING   Incomplete  URINE CULTURE     Status: None   Collection Time    01/06/14  7:00 AM      Result Value Ref Range Status   Specimen Description URINE, CLEAN CATCH   Final   Special Requests NONE   Final   Culture  Setup Time     Final   Value: 01/06/2014 12:53     Performed at [place]     Final   Value: 60,000 COLONIES/ML     Performed at [facility]   Culture     Final   Value: Multiple bacterial morphotypes present, none predominant. Suggest appropriate recollection if clinically indicated.     Performed at [facility]   Report Status 01/07/2014 FINAL   Final     Studies: No results found.  Scheduled Meds: . atorvastatin  10 mg Oral q1800  . budesonide-formoterol  2 puff Inhalation BID  . fluconazole  100 mg Oral Daily  . folic acid  1 mg Oral Daily  . furosemide  40 mg Oral Daily  . ipratropium-albuterol  3 mL Nebulization TID  . latanoprost  1 drop Both Eyes QHS  . levothyroxine  75 mcg Oral QAC breakfast  . metoprolol  tartrate  37.5 mg Oral BID  . nystatin  5 mL Oral QID  . omega-3 acid ethyl esters  2 g Oral Daily  . pantoprazole  40 mg Oral Daily  . piperacillin-tazobactam (ZOSYN)  IV  3.375 g Intravenous Q8H  . polyvinyl alcohol  1 drop Both Eyes BID  . potassium chloride  40 mEq Oral Daily  . predniSONE  40 mg Oral Q breakfast  . sodium chloride  3 mL Intravenous Q12H  . valproate sodium  250 mg Intravenous Q12H   Continuous Infusions:    Time spent: 25 minutes  Geradine Girt, DO  [facility] Pager [phone]. If 7PM-7AM, please contact night-coverage at [email or web address], password [facility] 01/12/2014, 9:48 AM  LOS: 7 days

## 2014-01-12 NOTE — Progress Notes (Addendum)
Subjective: Patient continues to be significantly confused at night.  Somewhat better during  The day but still not at baseline.  Extensive conversation had with granddaughter this morning.    Objective: Current vital signs: BP 112/79  Pulse 108  Temp(Src) 97.8 F (36.6 C) (Axillary)  Resp 22  Ht 5\' 9"  (1.753 m)  Wt 55.8 kg (123 lb 0.3 oz)  BMI 18.16 kg/m2  SpO2 91% Vital signs in last 24 hours: Temp:  [97.4 F (36.3 C)-98 F (36.7 C)] 97.8 F (36.6 C) (06/07 0415) Pulse Rate:  [97-108] 108 (06/07 0415) Resp:  [18-22] 22 (06/07 0415) BP: (93-114)/(54-79) 112/79 mmHg (06/07 0415) SpO2:  [91 %-97 %] 91 % (06/07 0906)  Intake/Output from previous day: 06/06 0701 - 06/07 0700 In: -  Out: 900 [Urine:900] Intake/Output this shift:   Nutritional status: Dysphagia  Neurologic Exam: Mental Status:  Alert but,not oriented to place. Speech fluent without evidence of aphasia. Able to follow commands.  Cooperative.  Able to recognize family members in the room.  Cranial Nerves:  II: Discs flat bilaterally; Visual fields blinks to threat bilaterally and able to count fingers, pupils equal, round, reactive to light and accommodation  III,IV, VI: Extra-ocular motions intact bilaterally  V,VII: smile symmetric, facial light touch sensation normal bilaterally  VIII: hearing normal bilaterally  IX,X: gag reflex present  XI: bilateral shoulder shrug  XII: midline tongue extension without atrophy or fasciculations  Motor:  Lifts all extremities symmetrically against gravity.   Tone and bulk:normal tone throughout; no atrophy noted  Sensory: Pinprick and light touch intact throughout, bilaterally  Deep Tendon Reflexes:  1+ throughout with no AJ   Lab Results: Basic Metabolic Panel:  Recent Labs Lab 01/05/14 1251 01/06/14 0017  01/08/14 0415 01/09/14 0345 01/10/14 0420 01/11/14 0355 01/12/14 0330  NA 141 142  < > 140 141 141 142 144  K 3.3* 2.6*  < > 3.6* 3.4* 3.4* 3.4* 3.4*   CL 100 100  < > 99 101 96 98 102  CO2 27 27  < > 30 31 32 31 31  GLUCOSE 122* 137*  < > 137* 134* 171* 147* 134*  BUN 21 21  < > 28* 20 18 16 23   CREATININE 0.41* 0.48*  < > 0.50 0.43* 0.47* 0.49* 0.54  CALCIUM 8.5 8.1*  < > 8.6 8.7 9.2 9.1 8.8  MG  --  1.7  --   --   --   --   --   --   < > = values in this interval not displayed.  Liver Function Tests: No results found for this basename: AST, ALT, ALKPHOS, BILITOT, PROT, ALBUMIN,  in the last 168 hours No results found for this basename: LIPASE, AMYLASE,  in the last 168 hours No results found for this basename: AMMONIA,  in the last 168 hours  CBC:  Recent Labs Lab 01/08/14 0415 01/09/14 0345 01/10/14 0420 01/11/14 0355 01/12/14 0330  WBC 11.8* 11.6* 11.5* 14.2* 12.2*  HGB 10.1* 10.2* 11.3* 11.3* 11.2*  HCT 30.4* 31.2* 34.2* 34.5* 34.5*  MCV 91.0 92.3 92.4 91.5 90.3  PLT 98* 117* 169 224 234    Cardiac Enzymes:  Recent Labs Lab 01/05/14 2023 01/06/14 0017 01/06/14 0907  TROPONINI <0.30 <0.30 <0.30    Lipid Panel: No results found for this basename: CHOL, TRIG, HDL, CHOLHDL, VLDL, LDLCALC,  in the last 168 hours  CBG: No results found for this basename: GLUCAP,  in the last 168 hours  Microbiology:  Results for orders placed during the hospital encounter of 01/05/14  CULTURE, BLOOD (ROUTINE X 2)     Status: None   Collection Time    01/05/14  8:43 PM      Result Value Ref Range Status   Specimen Description BLOOD LEFT HAND   Final   Special Requests BOTTLES DRAWN AEROBIC ONLY 5CC   Final   Culture  Setup Time     Final   Value: 01/06/2014 09:09     Performed at [facility]   Culture     Final   Value:        BLOOD CULTURE RECEIVED NO GROWTH TO DATE CULTURE WILL BE HELD FOR 5 DAYS BEFORE ISSUING A FINAL NEGATIVE REPORT     Performed at [facility]   Report Status PENDING   Incomplete  CULTURE, BLOOD (ROUTINE X 2)     Status: None   Collection Time    01/05/14  9:50 PM      Result  Value Ref Range Status   Specimen Description BLOOD RIGHT ARM   Final   Special Requests BOTTLES DRAWN AEROBIC AND ANAEROBIC 10CC EACH   Final   Culture  Setup Time     Final   Value: 01/06/2014 09:10     Performed at [facility]   Culture     Final   Value:        BLOOD CULTURE RECEIVED NO GROWTH TO DATE CULTURE WILL BE HELD FOR 5 DAYS BEFORE ISSUING A FINAL NEGATIVE REPORT     Performed at [facility]   Report Status PENDING   Incomplete  URINE CULTURE     Status: None   Collection Time    01/06/14  7:00 AM      Result Value Ref Range Status   Specimen Description URINE, CLEAN CATCH   Final   Special Requests NONE   Final   Culture  Setup Time     Final   Value: 01/06/2014 12:53     Performed at [place]     Final   Value: 60,000 COLONIES/ML     Performed at [facility]   Culture     Final   Value: Multiple bacterial morphotypes present, none predominant. Suggest appropriate recollection if clinically indicated.     Performed at [facility]   Report Status 01/07/2014 FINAL   Final    Coagulation Studies: No results found for this basename: LABPROT, INR,  in the last 72 hours  Imaging: No results found.  Medications:  I have reviewed the patient's current medications. Scheduled: . atorvastatin  10 mg Oral q1800  . budesonide-formoterol  2 puff Inhalation BID  . fluconazole  100 mg Oral Daily  . folic acid  1 mg Oral Daily  . furosemide  40 mg Oral Daily  . ipratropium-albuterol  3 mL Nebulization TID  . latanoprost  1 drop Both Eyes QHS  . levothyroxine  75 mcg Oral QAC breakfast  . metoprolol tartrate  37.5 mg Oral BID  . nystatin  5 mL Oral QID  . omega-3 acid ethyl esters  2 g Oral Daily  . pantoprazole  40 mg Oral Daily  . piperacillin-tazobactam (ZOSYN)  IV  3.375 g Intravenous Q8H  . polyvinyl alcohol  1 drop Both Eyes BID  . potassium chloride  40 mEq Oral Daily  . predniSONE  40 mg Oral Q  breakfast  . sodium  chloride  3 mL Intravenous Q12H  . valproate sodium  250 mg Intravenous Q12H    Assessment/Plan: Patient continues to have confusion that again I suspect is multifactorial and related to head trauma, infection and hospitalization (sundowning).    Recommendations: 1.  No further neurologic intervention is recommended at this time.  If further questions arise, please call or page at that time.  Thank you for allowing neurology to participate in the care of this patient.    LOS: 7 days   Alexis Goodell, MD [facility] [phone] 01/12/2014  11:26 AM

## 2014-01-13 LAB — CBC
HCT: 36.6 % (ref 36.0–46.0)
HEMOGLOBIN: 12 g/dL (ref 12.0–15.0)
MCH: 30 pg (ref 26.0–34.0)
MCHC: 32.8 g/dL (ref 30.0–36.0)
MCV: 91.5 fL (ref 78.0–100.0)
Platelets: 227 10*3/uL (ref 150–400)
RBC: 4 MIL/uL (ref 3.87–5.11)
RDW: 14 % (ref 11.5–15.5)
WBC: 10.9 10*3/uL — ABNORMAL HIGH (ref 4.0–10.5)

## 2014-01-13 LAB — COMPREHENSIVE METABOLIC PANEL
ALK PHOS: 47 U/L (ref 39–117)
ALT: 22 U/L (ref 0–35)
AST: 24 U/L (ref 0–37)
Albumin: 2.9 g/dL — ABNORMAL LOW (ref 3.5–5.2)
BUN: 17 mg/dL (ref 6–23)
CHLORIDE: 102 meq/L (ref 96–112)
CO2: 27 meq/L (ref 19–32)
Calcium: 8.7 mg/dL (ref 8.4–10.5)
Creatinine, Ser: 0.5 mg/dL (ref 0.50–1.10)
GFR calc Af Amer: >90 mL/min (ref >90)
GFR, EST NON AFRICAN AMERICAN: 83 mL/min — AB (ref >90)
Glucose, Bld: 136 mg/dL — ABNORMAL HIGH (ref 70–99)
POTASSIUM: 3 meq/L — AB (ref 3.7–5.3)
SODIUM: 144 meq/L (ref 137–147)
Total Bilirubin: 2.2 mg/dL — ABNORMAL HIGH (ref 0.3–1.2)
Total Protein: 5.9 g/dL — ABNORMAL LOW (ref 6.0–8.3)

## 2014-01-13 MED ORDER — DIVALPROEX SODIUM 125 MG PO CPSP
250.0000 mg | ORAL_CAPSULE | Freq: Two times a day (BID) | ORAL | Status: DC
Start: 2014-01-13 — End: 2014-01-13
  Administered 2014-01-13: 250 mg via ORAL
  Filled 2014-01-13 (×2): qty 2

## 2014-01-13 MED ORDER — NYSTATIN 100000 UNIT/ML MT SUSP: 5.0000 mL | Freq: Four times a day (QID) | OROMUCOSAL | Status: AC

## 2014-01-13 MED ORDER — HALOPERIDOL 0.5 MG PO TABS: 0.5000 mg | ORAL_TABLET | Freq: Four times a day (QID) | ORAL | Status: AC | PRN

## 2014-01-13 MED ORDER — GUAIFENESIN 100 MG/5ML PO SYRP: 200.0000 mg | ORAL_SOLUTION | ORAL | Status: AC | PRN

## 2014-01-13 MED ORDER — METOPROLOL TARTRATE 12.5 MG HALF TABLET: 37.5000 mg | ORAL_TABLET | Freq: Two times a day (BID) | ORAL | Status: AC

## 2014-01-13 MED ORDER — DIVALPROEX SODIUM 125 MG PO CPSP: 250.0000 mg | ORAL_CAPSULE | Freq: Two times a day (BID) | ORAL | Status: AC

## 2014-01-13 MED ORDER — IPRATROPIUM-ALBUTEROL 0.5-2.5 (3) MG/3ML IN SOLN: 3.0000 mL | Freq: Three times a day (TID) | RESPIRATORY_TRACT | Status: AC

## 2014-01-13 MED ORDER — PREDNISONE 10 MG PO TABS: ORAL_TABLET | ORAL | Status: AC

## 2014-01-13 MED ORDER — POTASSIUM CHLORIDE CRYS ER 20 MEQ PO TBCR: 40.0000 meq | EXTENDED_RELEASE_TABLET | Freq: Two times a day (BID) | ORAL | Status: AC

## 2014-01-13 MED ORDER — POTASSIUM CHLORIDE CRYS ER 20 MEQ PO TBCR
40.0000 meq | EXTENDED_RELEASE_TABLET | Freq: Two times a day (BID) | ORAL | Status: DC
  Administered 2014-01-13: 40 meq via ORAL

## 2014-01-13 MED ORDER — OMEGA-3-ACID ETHYL ESTERS 1 G PO CAPS: 2.0000 g | ORAL_CAPSULE | Freq: Every day | ORAL | Status: AC

## 2014-01-13 MED ORDER — IPRATROPIUM-ALBUTEROL 0.5-2.5 (3) MG/3ML IN SOLN: 3.0000 mL | RESPIRATORY_TRACT | Status: AC | PRN

## 2014-01-13 MED ORDER — FLUCONAZOLE 100 MG PO TABS: 100.0000 mg | ORAL_TABLET | Freq: Every day | ORAL | Status: AC

## 2014-01-13 MED ORDER — FUROSEMIDE 40 MG PO TABS: 40.0000 mg | ORAL_TABLET | Freq: Every day | ORAL | Status: AC

## 2014-01-13 NOTE — Plan of Care (Signed)
Problem: SLP Dysphagia Goals Goal: Patient will utilize recommended strategies Patient will utilize recommended strategies during swallow to increase swallowing safety with  Outcome: Not Met (add Reason) Cognitive impairments

## 2014-01-13 NOTE — Progress Notes (Signed)
Speech Language Pathology Treatment: Dysphagia  Patient Details Name: Erica [name] MRN: [id number] DOB: Jun 16, 1921 Today's Date: 01/13/2014 Time: 9983-3825 SLP Time Calculation (min): 24 min  Assessment / Plan / Recommendation Clinical Impression  Treatment focused on dysphagia education with daughter.  Appears to have an overall cognitive decline since SLP initially met pt. on 01/06/14.  Daughter reports she is consuming less po's than previously.  No overt s/s aspiration with thin liquids or Dys 1 consistency although aspiration risk increased with poor attention, decreased sleep (sundowning).  SLP reviewed education for providing the safest po consumption possible with precautions.  Pt. Scheduled for SNF transfer when medically ready.  ST will sign off at this time, recommend continue ST at SNF.   HPI HPI: 80 year old female with a history of chronic atrial fibrillation, non-small cell lung cancer status post right upper lobe lobectomy, COPD, hypothyroidism, and hyperlipidemia presented to ED after a mechanical fall sustained to her face. Increasing shortness of breath for the past 2-3 days.  CT of the brain was negative for any acute intracranial abnormality.  No hip fx's.  CXR 5/31 revealed No acute cardiopulmonary process.  MD note reveals concern about pulmonary infection.  MBS revealed mild pharyngeal dysphagia and severe cervical esophageal dysphagia with what appears to be a Zenker's diverticulum with backflow to pharynx.    Pertinent Vitals WDL  SLP Plan  Discharge SLP treatment due to (comment) (education completed with daughter)    Recommendations Diet recommendations: Dysphagia 1 (puree);Thin liquid Liquids provided via: Teaspoon;No straw Medication Administration: Crushed with puree Supervision: Staff to assist with self feeding;Full supervision/cueing for compensatory strategies Compensations: Slow rate;Small sips/bites;Multiple dry swallows after each bite/sip;Follow solids with  liquid;Clear throat intermittently Postural Changes and/or Swallow Maneuvers: Seated upright 90 degrees;Upright 30-60 min after meal              Oral Care Recommendations: Oral care BID Follow up Recommendations: Skilled Nursing facility Plan: Discharge SLP treatment due to (comment) (education completed with daughter)    GO     [place] Mon.Ed [employer] [phone]  01/13/2014

## 2014-01-13 NOTE — Progress Notes (Signed)
Pt D/C'd to SNF.  Alert and oriented to self only.  No c/o pain.  Pt continues to have upper respiratory congestion.  VS WNL.  IV D/C'd.  Tele D/C'd.  Skin is bruised all over.  Pt has healed skin tear on right arm.  Pt transported with education on diet, activity, meds, and follow-up care and instructions.  Taken to SNF via ambulance transport.

## 2014-01-13 NOTE — Progress Notes (Signed)
CSW spoke to MD about dc today. CSW went to speak to patient's daughter regarding SNF choice. Daughter states she would like [facility]. CSW contacted SNF and they have a room for patient today.   Jeanette Caprice, MSW, [place]

## 2014-01-13 NOTE — Progress Notes (Signed)
Clinical Social Work Department CLINICAL SOCIAL WORK PLACEMENT NOTE 01/13/2014  Patient:  KEYARI, KLEEMAN  Account Number:  [id number] Admit date:  01/05/2014  Clinical Social Worker:  Megan Salon  Date/time:  01/08/2014 01:41 PM  Clinical Social Work is seeking post-discharge placement for this patient at the following level of care:   [place]   (*CSW will update this form in Epic as items are completed)   01/08/2014  Patient/family provided with [place] Department of Clinical Social Work's list of facilities offering this level of care within the geographic area requested by the patient (or if unable, by the patient's family).  01/08/2014  Patient/family informed of their freedom to choose among providers that offer the needed level of care, that participate in Medicare, Medicaid or managed care program needed by the patient, have an available bed and are willing to accept the patient.  01/08/2014  Patient/family informed of [facility]' ownership interest in [facility], as well as of the fact that they are under no obligation to receive care at this facility.  PASARR submitted to EDS on 01/08/2014 PASARR number received on 01/08/2014  FL2 transmitted to all facilities in geographic area requested by pt/family on  01/08/2014 FL2 transmitted to all facilities within larger geographic area on   Patient informed that his/her managed care company has contracts with or will negotiate with  certain facilities, including the following:     Patient/family informed of bed offers received:  01/13/2014 Patient chooses bed at [facility] Physician recommends and patient chooses bed at    Patient to be transferred to [facility] on  01/13/2014 Patient to be transferred to facility by EMS Patient and family notified of transfer on 01/13/2014 Name of family member notified:  Daughter  The following physician request were entered  in Epic:   Additional Comments:  Jeanette Caprice, MSW, [place]

## 2014-01-13 NOTE — Discharge Summary (Signed)
Physician Discharge Summary  [name] CWU:[id number] DOB: [birth year] DOA: 01/05/2014  PCP: Redge Gainer, MD  Admit date: 01/05/2014 Discharge date: 01/13/2014  Time spent: 35 minutes  Recommendations for Outpatient Follow-up:  1. Cbc, bmp Thursday and then PRN 2. Daughter stays with patient during the night to help with delirium 3. See stop date of diflucan and nystatin- reassess to see if still needed then 4. Wean steroids 5. To SNf- poor overall prognosis    Discharge Diagnoses:  Active Problems:   Hypothyroidism   Atrial fibrillation with RVR   COPD exacerbation   Facial hematoma   Hypokalemia   Thrombocytopenia, unspecified   Hematoma of right thigh   Cough   Fever, unspecified   Essential hypertension, benign   Fall at home   Discharge Condition: stable  Diet recommendation: DYS 1 thin  Filed Weights   01/05/14 1244 01/05/14 1750  Weight: 61.236 kg (135 lb) 55.8 kg (123 lb 0.3 oz)    History of present illness:  80 year old female with a history of chronic atrial fibrillation on warfarin, non-small cell lung cancer status post right upper lobe lobectomy, COPD, hypothyroidism, and hyperlipidemia presented to ED after a mechanical fall sustained to her face. The patient denied any syncope. The patient actually waited until her family came to visit her 4 hours later before coming to the emergency department. She presently denies any fevers, chills, chest pain, nausea, vomiting, diarrhea, abdominal pain. She complains of some pain on her face as well as her right thigh. Notably, the patient states that she has had some increasing shortness of breath for the past 2-3 days with increasing nonproductive cough. She denies any hemoptysis. She denies any fevers. She feels that she has been wheezing.  In the emergency department, the patient was noted to have atrial fibrillation with RVR with a heart rate 130 to 140. She was also given a 10 mg diltiazem bolus prior to starting  the drip The patient was started on a diltiazem drip with a heart rate improving to 95-105. CT of the face revealed no fractures of the orbit or maxilla or zygoma but showed right facial hematoma and swelling. CT of the brain was negative for any acute intracranial abnormality. CT of the cervical spine was negative for any osseous injury. Potassium was 3.3 with unremarkable BMP. Platelets are 65,000. INR was 3.81. The patient was hemodynamically stable. X-rays of her left and right hip were negative for any fractures.      Hospital Course:  Atrial fibrillation with RVR:  -resume PO BB  Patient is not a candidate for Coumadin any longer, given the severity of her fall, advanced age.   AMS (acute metabolic encephalopathy) /delirium  concussion  -continue to monitor  -seen by neuro - doubt she will return to her previous level of function but time will tell - PRN haldol for restlessness- mental status improved with depakote (monitor LFTs)  Fever, unspecified:  -?PNA  ABX IV- for total of 8 days  -?aspiration- zosyn x 8 days  Dysphagia  -diet dys 1   Hypothyroidism: TSH normal   COPD exacerbation: wean steroids and bronchodilators.  -add mucinex  -lasix daily for now   Facial hematoma: No fractures.   Hypokalemia: Replete daily -periodic BMPs  Thrombocytopenia, unspecified:  -B12 and folate ok   Hematoma of right thigh   Thrush  -nystatin x 7 days- stop date 6/10 -diflucan x 7 days - stop date 6/10  Cough: See above   Essential hypertension,  benign: Blood pressure borderline low currently. See above.   Leukocytosis-  Decreasing      Procedures: Echo: Study Conclusions  - Left ventricle: The cavity size was normal. Wall thickness was increased in a pattern of mild LVH. There was mild focal basal hypertrophy of the septum. Systolic function was normal. The estimated ejection fraction was in the range of 50% to 55%. Doppler parameters are consistent with high  ventricular filling pressure. - Aortic valve: There was mild to moderate stenosis. Valve area (VTI): 1.04 cm^2. Valve area (Vmax): 0.96 cm^2. - Mitral valve: Calcified annulus. Moderately thickened leaflets . The findings are consistent with mild stenosis. There was mild regurgitation. - Left atrium: The atrium was mildly dilated. - Right ventricle: Systolic function was mildly reduced. - Right atrium: The atrium was moderately dilated. - Tricuspid valve: There was moderate regurgitation. - Pulmonary arteries: PA peak pressure: 43 mm Hg (S).    Consultations:  neuro  Discharge Exam: Filed Vitals:   01/13/14 0519  BP: 126/67  Pulse: 106  Temp: 98.1 F (36.7 C)  Resp: 20    General: restless- answers some questions Cardiovascular: *irr Respiratory: mild expiratory wheezing  Discharge Instructions You were cared for by a hospitalist during your hospital stay. If you have any questions about your discharge medications or the care you received while you were in the hospital after you are discharged, you can call the unit and asked to speak with the hospitalist on call if the hospitalist that took care of you is not available. Once you are discharged, your primary care physician will handle any further medical issues. Please note that NO REFILLS for any discharge medications will be authorized once you are discharged, as it is imperative that you return to your primary care physician (or establish a relationship with a primary care physician if you do not have one) for your aftercare needs so that they can reassess your need for medications and monitor your lab values.      Discharge Instructions   Discharge instructions    Complete by:  As directed   DYS 1 thin liquids- aspiration precautions Cbc, bmp Thrusday re K     Increase activity slowly    Complete by:  As directed             Medication List    STOP taking these medications       albuterol 108 (90 BASE) MCG/ACT  inhaler  Commonly known as:  PROVENTIL HFA;VENTOLIN HFA     CALTRATE 600+D 600-400 MG-UNIT per tablet  Generic drug:  Calcium Carbonate-Vitamin D     fish oil-omega-3 fatty acids 1000 MG capsule     guaiFENesin 600 MG 12 hr tablet  Commonly known as:  MUCINEX  Replaced by:  guaifenesin 100 MG/5ML syrup     Iron 240 (27 FE) MG Tabs     metoprolol succinate 100 MG 24 hr tablet  Commonly known as:  TOPROL-XL     RECLAST 5 MG/100ML Soln injection  Generic drug:  zoledronic acid     tiotropium 18 MCG inhalation capsule  Commonly known as:  SPIRIVA     VITAMIN D-3 PO     warfarin 5 MG tablet  Commonly known as:  COUMADIN      TAKE these medications       acetaminophen 500 MG tablet  Commonly known as:  TYLENOL  Take 500 mg by mouth every 6 (six) hours as needed for mild pain.  atorvastatin 10 MG tablet  Commonly known as:  LIPITOR  Take 10 mg by mouth daily.     budesonide-formoterol 160-4.5 MCG/ACT inhaler  Commonly known as:  SYMBICORT  Inhale 2 puffs into the lungs 2 (two) times daily.     divalproex 125 MG capsule  Commonly known as:  DEPAKOTE SPRINKLE  Take 2 capsules (250 mg total) by mouth every 12 (twelve) hours.     fluconazole 100 MG tablet  Commonly known as:  DIFLUCAN  Take 1 tablet (100 mg total) by mouth daily.     folic acid 694 MCG tablet  Commonly known as:  FOLVITE  Take 400 mcg by mouth daily.     furosemide 40 MG tablet  Commonly known as:  LASIX  Take 1 tablet (40 mg total) by mouth daily.     guaifenesin 100 MG/5ML syrup  Commonly known as:  ROBITUSSIN  Take 10 mLs (200 mg total) by mouth every 4 (four) hours as needed for congestion.     haloperidol 0.5 MG tablet  Commonly known as:  HALDOL  Take 1 tablet (0.5 mg total) by mouth every 6 (six) hours as needed for agitation (restlessness).     ipratropium-albuterol 0.5-2.5 (3) MG/3ML Soln  Commonly known as:  DUONEB  Take 3 mLs by nebulization 3 (three) times daily.      ipratropium-albuterol 0.5-2.5 (3) MG/3ML Soln  Commonly known as:  DUONEB  Take 3 mLs by nebulization every 4 (four) hours as needed.     levothyroxine 75 MCG tablet  Commonly known as:  SYNTHROID, LEVOTHROID  Take 75 mcg by mouth daily before breakfast.     metoprolol tartrate 12.5 mg Tabs tablet  Commonly known as:  LOPRESSOR  Take 1.5 tablets (37.5 mg total) by mouth 2 (two) times daily.     nystatin 100000 UNIT/ML suspension  Commonly known as:  MYCOSTATIN  Take 5 mLs (500,000 Units total) by mouth 4 (four) times daily.     omega-3 acid ethyl esters 1 G capsule  Commonly known as:  LOVAZA  Take 2 capsules (2 g total) by mouth daily.     omeprazole 20 MG capsule  Commonly known as:  PRILOSEC  Take 20 mg by mouth daily.     potassium chloride SA 20 MEQ tablet  Commonly known as:  K-DUR,KLOR-CON  Take 2 tablets (40 mEq total) by mouth 2 (two) times daily.     predniSONE 10 MG tablet  Commonly known as:  DELTASONE  30 mg x 3 days, 20 mg x 3 days, 10 mg x 3 days, 5 mg x 3 days     SYSTANE 0.4-0.3 % Soln  Generic drug:  Polyethyl Glycol-Propyl Glycol  Place 1 drop into both eyes 2 (two) times daily.     TRAVATAN Z 0.004 % Soln ophthalmic solution  Generic drug:  Travoprost (BAK Free)  Place 1 drop into both eyes at bedtime.       No Known Allergies    The results of significant diagnostics from this hospitalization (including imaging, microbiology, ancillary and laboratory) are listed below for reference.    Significant Diagnostic Studies: Dg Chest 1 View  01/05/2014   CLINICAL DATA:  Lung cancer, asthma  EXAM: CHEST - 1 VIEW  COMPARISON:  CT 10/21/2013  FINDINGS: Cardiac silhouette is enlarged but unchanged. Large hiatal hernia noted. Chronic bronchitic markings noted within the lungs. No focal consolidation. No pneumothorax.  IMPRESSION: 1.  No acute cardiopulmonary process.  . 2. Cardiomegaly and  chronic bronchitic markings.   Electronically Signed   By: Suzy Bouchard M.D.   On: 01/05/2014 14:05   Dg Femur Left  01/05/2014   CLINICAL DATA:  Left leg pain  EXAM: LEFT FEMUR - 2 VIEW  COMPARISON:  None.  FINDINGS: Mild degenerative changes of left knee joint are seen. Diffuse vascular calcifications are noted. No acute fracture or dislocation is seen.  IMPRESSION: Chronic changes without acute abnormality.   Electronically Signed   By: Inez Catalina M.D.   On: 01/05/2014 14:05   Dg Femur Right  01/05/2014   CLINICAL DATA:  Patient fell today  EXAM: RIGHT FEMUR - 2 VIEW  COMPARISON:  None.  FINDINGS: There is no evidence of fracture or other focal bone lesions. Soft tissues are unremarkable. There is peripheral vascular atherosclerotic disease.  IMPRESSION: Negative.   Electronically Signed   By: Kathreen Devoid   On: 01/05/2014 14:23   Ct Head Wo Contrast  01/08/2014   CLINICAL DATA:  AMS r/o CVA  EXAM: CT HEAD WITHOUT CONTRAST  TECHNIQUE: Contiguous axial images were obtained from the base of the skull through the vertex without intravenous contrast.  COMPARISON:  01/05/2014  FINDINGS: Atherosclerotic and physiologic intracranial calcifications. There is fluid level in the sphenoid sinus as before. Stable small right basal ganglia lacunar infarct. Diffuse parenchymal atrophy. Patchy areas of hypoattenuation in deep and periventricular white matter bilaterally. Negative for acute intracranial hemorrhage, mass lesion, acute infarction, midline shift, or mass-effect. Acute infarct may be inapparent on noncontrast CT. Ventricles and sulci symmetric. Bone windows demonstrate no focal lesion.  IMPRESSION: 1. Negative for bleed or other acute intracranial process. 2. Sphenoid sinus disease as before.   Electronically Signed   By: Arne Cleveland M.D.   On: 01/08/2014 13:27   Ct Head Wo Contrast  01/05/2014   CLINICAL DATA:  Status post fall  EXAM: CT HEAD WITHOUT CONTRAST  CT MAXILLOFACIAL WITHOUT CONTRAST  CT CERVICAL SPINE WITHOUT CONTRAST  TECHNIQUE: Multidetector CT  imaging of the head, cervical spine, and maxillofacial structures were performed using the standard protocol without intravenous contrast. Multiplanar CT image reconstructions of the cervical spine and maxillofacial structures were also generated.  COMPARISON:  None.  FINDINGS: CT HEAD FINDINGS  There is no evidence of mass effect, midline shift, or extra-axial fluid collections. There is no evidence of a space-occupying lesion or intracranial hemorrhage. There is no evidence of a cortical-based area of acute infarction. There is generalized cerebral atrophy. There is periventricular white matter low attenuation likely secondary to microangiopathy.  The ventricles and sulci are appropriate for the patient's age. The basal cisterns are patent.  Visualized portions of the orbits are unremarkable. The mastoid air cells are clear. There is an air-fluid level in the right sphenoid sinus. Bilateral ethmoid sinus mucosal thickening. Cerebrovascular atherosclerotic calcifications are noted.  The osseous structures are unremarkable. Severe right facial and right periorbital soft tissue swelling and hematoma.  CT MAXILLOFACIAL FINDINGS  Severe right periorbital and right facial soft tissue swelling as well as hematoma formation. The globes are intact. The orbital walls are intact. The orbital floors are intact. The maxilla is intact. The mandible is intact. The zygomatic arches are intact. The nasal septum is midline. There is no nasal bone fracture. The temporomandibular joints are normal.  Right sphenoid sinus air-fluid level. Bilateral ethmoid sinus mucosal thickening. The visualized portions of the mastoid sinuses are well aerated.  CT CERVICAL SPINE FINDINGS  The alignment is anatomic. The vertebral body  heights are maintained. There is no acute fracture. There is no static listhesis. The prevertebral soft tissues are normal. The intraspinal soft tissues are not fully imaged on this examination due to poor soft tissue  contrast, but there is no gross soft tissue abnormality.  There is mild degenerative disc disease throughout the cervical spine. There are mild broad-based disc bulges at C3-4, C4-5, C5-6 and C6-7. At C3-4 there is bilateral facet arthropathy and bilateral uncovertebral degenerative change with left foraminal stenosis. At C4-5 there is bilateral facet arthropathy and uncovertebral degenerative change with bilateral foraminal stenosis, left greater than right. At C5-6 there is left uncovertebral degenerative change.  The visualized portions of the lung apices demonstrate no focal abnormality.There is bilateral carotid artery atherosclerosis.  IMPRESSION: 1. No acute intracranial pathology. 2. No acute osseous injury of the maxillofacial bones. Severe right facial soft tissue swelling and right periorbital soft tissue swelling as well as hematoma formation. 3. No acute osseous injury of the cervical spine. 4. Cervical spine spondylosis as described above.   Electronically Signed   By: Kathreen Devoid   On: 01/05/2014 13:56   Ct Cervical Spine Wo Contrast  01/05/2014   CLINICAL DATA:  Status post fall  EXAM: CT HEAD WITHOUT CONTRAST  CT MAXILLOFACIAL WITHOUT CONTRAST  CT CERVICAL SPINE WITHOUT CONTRAST  TECHNIQUE: Multidetector CT imaging of the head, cervical spine, and maxillofacial structures were performed using the standard protocol without intravenous contrast. Multiplanar CT image reconstructions of the cervical spine and maxillofacial structures were also generated.  COMPARISON:  None.  FINDINGS: CT HEAD FINDINGS  There is no evidence of mass effect, midline shift, or extra-axial fluid collections. There is no evidence of a space-occupying lesion or intracranial hemorrhage. There is no evidence of a cortical-based area of acute infarction. There is generalized cerebral atrophy. There is periventricular white matter low attenuation likely secondary to microangiopathy.  The ventricles and sulci are appropriate  for the patient's age. The basal cisterns are patent.  Visualized portions of the orbits are unremarkable. The mastoid air cells are clear. There is an air-fluid level in the right sphenoid sinus. Bilateral ethmoid sinus mucosal thickening. Cerebrovascular atherosclerotic calcifications are noted.  The osseous structures are unremarkable. Severe right facial and right periorbital soft tissue swelling and hematoma.  CT MAXILLOFACIAL FINDINGS  Severe right periorbital and right facial soft tissue swelling as well as hematoma formation. The globes are intact. The orbital walls are intact. The orbital floors are intact. The maxilla is intact. The mandible is intact. The zygomatic arches are intact. The nasal septum is midline. There is no nasal bone fracture. The temporomandibular joints are normal.  Right sphenoid sinus air-fluid level. Bilateral ethmoid sinus mucosal thickening. The visualized portions of the mastoid sinuses are well aerated.  CT CERVICAL SPINE FINDINGS  The alignment is anatomic. The vertebral body heights are maintained. There is no acute fracture. There is no static listhesis. The prevertebral soft tissues are normal. The intraspinal soft tissues are not fully imaged on this examination due to poor soft tissue contrast, but there is no gross soft tissue abnormality.  There is mild degenerative disc disease throughout the cervical spine. There are mild broad-based disc bulges at C3-4, C4-5, C5-6 and C6-7. At C3-4 there is bilateral facet arthropathy and bilateral uncovertebral degenerative change with left foraminal stenosis. At C4-5 there is bilateral facet arthropathy and uncovertebral degenerative change with bilateral foraminal stenosis, left greater than right. At C5-6 there is left uncovertebral degenerative change.  The visualized  portions of the lung apices demonstrate no focal abnormality.There is bilateral carotid artery atherosclerosis.  IMPRESSION: 1. No acute intracranial pathology. 2.  No acute osseous injury of the maxillofacial bones. Severe right facial soft tissue swelling and right periorbital soft tissue swelling as well as hematoma formation. 3. No acute osseous injury of the cervical spine. 4. Cervical spine spondylosis as described above.   Electronically Signed   By: Kathreen Devoid   On: 01/05/2014 13:56   Dg Chest Port 1 View  01/10/2014   CLINICAL DATA:  Short of breath.  Wheezing  EXAM: PORTABLE CHEST - 1 VIEW  COMPARISON:  01/06/2014  FINDINGS: Moderate enlargement of the cardiopericardial silhouette. No convincing mediastinal or hilar masses.  There is bilateral interstitial thickening. No lung consolidation. No convincing effusion no pneumothorax.  Bony thorax is diffusely demineralized.  IMPRESSION: 1. Moderate enlargement of the cardiopericardial silhouette, stable. 2. Bilateral interstitial thickening but no areas of lung confluent opacity. Mild congestive heart failure is fell likely given history shortness of breath. No evidence of pneumonia.   Electronically Signed   By: Lajean Manes M.D.   On: 01/10/2014 01:43   Dg Chest Port 1 View  01/06/2014   CLINICAL DATA:  80 year old female with cough, wheeze, possible pneumonia. Initial encounter. Shortness of Breath.  EXAM: PORTABLE CHEST - 1 VIEW  COMPARISON:  01/05/2014 and earlier.  FINDINGS: Portable AP semi upright view at at 1514 hrs. Stable cardiomegaly and mediastinal contours. Mildly increased elevation of the left hemidiaphragm. New veiling opacity at the right lung base. Increased interstitial markings throughout. No definite consolidation. No pneumothorax. Calcified atherosclerosis of the aorta. Postoperative changes from thyroid surgery on the right.  IMPRESSION: Stable cardiomegaly with increased interstitial opacity diffusely and evidence of new right pleural effusion. Main differential considerations are interstitial edema and viral/ atypical respiratory infection.   Electronically Signed   By: Lars Pinks M.D.    On: 01/06/2014 15:47   Dg Swallowing Func-speech Pathology  01/07/2014   Macario Golds, CCC-SLP     01/07/2014  9:55 AM Objective Swallowing Evaluation: Modified Barium Swallowing Study   Patient Details  Name: Erica [name] MRN: [id number] Date of Birth: April 03, 1921  Today's Date: 01/07/2014 Time: 2951-8841 SLP Time Calculation (min): 30 min  Past Medical History:  Past Medical History  Diagnosis Date  . Lung cancer 09-11-2007    Dr Arlyce Dice did surgery; Dr. Inda Merlin at [facility]  . Allergic rhinitis   . Atrial fibrillation   . Hyperlipidemia   . Asthma   . Dysrhythmia     ATRIAL FIBRILATION   Past Surgical History:  Past Surgical History  Procedure Laterality Date  . Inguinal hernia repair      right  . Thyroid surgery      nodule resection  . Lung lobectomy  09-11-2007    right upper lobe  . Hemorroidectomy     HPI:  80 year old female with a history of chronic atrial fibrillation,  non-small cell lung cancer status post right upper lobe  lobectomy, COPD, hypothyroidism, and hyperlipidemia presented to  ED after a mechanical fall sustained to her face. Increasing  shortness of breath for the past 2-3 days.  CT of the brain was  negative for any acute intracranial abnormality.  No hip fx's.   CXR 5/31 revealed No acute cardiopulmonary process.  MD note  reveals concern about pulmonary infection.  Daughter reports  difficulty with pills, cornbread and some coughing with liquids  prior to admission.  Assessment / Plan / Recommendation Clinical Impression  Dysphagia Diagnosis: Moderate oral phase dysphagia;Severe  cervical esophageal phase dysphagia;Mild pharyngeal phase  dysphagia   Clinical impression:   Moderate oral, mild pharyngeal and severe cervical esophageal  dysphagia without aspiration of any consistency tested.  Oral  deficits characterized by weakness/discoordination resulting in  lingual pumping, delays in transiting and excessive "mastication"  even after bolus cleared.  Pharyngeal dysphagia  characterized by  minimal delay swallow reflex and mild to moderate stasis without  pt awareness.    Pt 's cervical esophageal dysphagia characterized by appearance  of Zenker's diverticulum that fills with boluses (worse with  solids)  resulting in trace backflow of liquids into pharynx.   Suspect this is pt's primary source of dysphagia symptoms as pt  with poor awareness/sensation to stasis/backflow.  Cued dry  swallows when pt able to elicit aided clearance.    Using live video, SlP provided pt with education and reinforced  effective compensation strategies. Recommend puree/thin diet with  strict aspiration precautions/compensation strategies.  Daughter  Arville Go present for MBS and SlP educated her that pt likely has  component of chronic dysphagia/asp risk - role of SLP is  mitigation.    Recommend puree/thin with strict precautions.  SLP to follow for  tolerance, family/pt education.     Treatment Recommendation  Therapy as outlined in treatment plan below    Diet Recommendation Dysphagia 1 (Puree);Thin liquid   Liquid Administration via: Cup Medication Administration: Crushed with puree Supervision: Patient able to self feed;Full supervision/cueing  for compensatory strategies Compensations: Slow rate;Small sips/bites;Multiple dry swallows  after each bite/sip;Follow solids with liquid Postural Changes and/or Swallow Maneuvers: Seated upright 90  degrees;Upright 30-60 min after meal    Other  Recommendations Oral Care Recommendations: Oral care BID   Follow Up Recommendations    TBD   Frequency and Duration min 2x/week  2 weeks     General Date of Onset: 01/07/14 HPI: 80 year old female with a history of chronic atrial  fibrillation, non-small cell lung cancer status post right upper  lobe lobectomy, COPD, hypothyroidism, and hyperlipidemia  presented to ED after a mechanical fall sustained to her face.  Increasing shortness of breath for the past 2-3 days.  CT of the  brain was negative for any acute  intracranial abnormality.  No  hip fx's.  CXR 5/31 revealed No acute cardiopulmonary process.   MD note reveals concern about pulmonary infection.  Daughter  reports difficulty with pills, cornbread and some coughing with  liquids prior to admission.  Type of Study: Modified Barium Swallowing Study Reason for Referral: Objectively evaluate swallowing function Previous Swallow Assessment:  (none) Diet Prior to this Study: Dysphagia 3 (soft);Nectar-thick liquids Temperature Spikes Noted: No Respiratory Status: Nasal cannula History of Recent Intubation: No Behavior/Cognition: Alert;Cooperative Oral Cavity - Dentition: Edentulous Oral Motor / Sensory Function: Impaired - see Bedside swallow  eval Self-Feeding Abilities: Able to feed self;Needs set up;Total  assist Patient Positioning: Upright in chair Baseline Vocal Quality: Clear Volitional Cough: Weak Volitional Swallow: Able to elicit (with delay) Anatomy:  (appearance of Zenker's diverticulum) Pharyngeal Secretions: Standing secretions in (comment) (pharynx  mixing with secretions, further intake clears)    Reason for Referral Objectively evaluate swallowing function   Oral Phase Oral Preparation/Oral Phase Oral Phase: Impaired Oral - Nectar Oral - Nectar Cup: Piecemeal swallowing;Reduced posterior  propulsion;Lingual pumping;Delayed oral transit;Lingual/palatal  residue Oral - Thin Oral - Thin Teaspoon: Lingual pumping;Reduced posterior  propulsion;Piecemeal swallowing;Delayed oral  transit;Lingual/palatal residue Oral -  Thin Cup: Reduced posterior propulsion;Lingual/palatal  residue;Piecemeal swallowing;Delayed oral transit;Lingual pumping Oral - Thin Straw: Lingual pumping;Reduced posterior  propulsion;Piecemeal swallowing;Lingual/palatal residue Oral - Solids Oral - Puree: Reduced posterior propulsion;Lingual  pumping;Piecemeal swallowing;Lingual/palatal residue;Delayed oral  transit Oral - Mechanical Soft: Lingual/palatal residue;Piecemeal  swallowing;Reduced  posterior propulsion;Lingual pumping;Delayed  oral transit (pt expectorated partially masticated cereal bar per  SlP verbal/visual cue) Oral Phase - Comment Oral Phase - Comment: pt presents with excessive "mastication"  even after completing swallows of solids - daughter reports this  to be baseline phenomenon, cues to dry swallow effective when pt  able to elicit (approx 93% opportunities)   Pharyngeal Phase Pharyngeal Phase Pharyngeal Phase: Impaired Pharyngeal - Nectar Pharyngeal - Nectar Teaspoon: Reduced pharyngeal  peristalsis;Pharyngeal residue - pyriform sinuses;Pharyngeal  residue - valleculae;Premature spillage to valleculae Pharyngeal - Nectar Cup: Pharyngeal residue -  valleculae;Pharyngeal residue - pyriform sinuses;Pharyngeal  residue - cp segment;Premature spillage to valleculae Pharyngeal - Thin Pharyngeal - Thin Teaspoon: Pharyngeal residue -  valleculae;Pharyngeal residue - pyriform sinuses;Pharyngeal  residue - cp segment;Lateral channel residue;Premature spillage  to valleculae Pharyngeal - Thin Cup: Lateral channel residue;Pharyngeal residue  - pyriform sinuses;Pharyngeal residue - valleculae;Pharyngeal  residue - cp segment;Premature spillage to valleculae Pharyngeal - Thin Straw: Pharyngeal residue - cp  segment;Pharyngeal residue - valleculae;Pharyngeal residue -  pyriform sinuses;Lateral channel residue;Premature spillage to  valleculae Pharyngeal - Solids Pharyngeal - Puree: Delayed swallow initiation;Premature spillage  to valleculae;Pharyngeal residue - valleculae Pharyngeal - Mechanical Soft: Delayed swallow  initiation;Premature spillage to valleculae;Pharyngeal residue -  valleculae Pharyngeal Phase - Comment Pharyngeal Comment: pt benefited from following solids with  liquids to faciliate pharyngeal clearance  Cervical Esophageal Phase    GO    Cervical Esophageal Phase Cervical Esophageal Phase: Impaired Cervical Esophageal Phase - Nectar Nectar Cup: Reduced cricopharyngeal  relaxation Cervical Esophageal Phase - Thin Thin Teaspoon: Reduced cricopharyngeal relaxation Thin Cup: Reduced cricopharyngeal relaxation;Esophageal backflow  into the pharynx Thin Straw: Reduced cricopharyngeal relaxation;Esophageal  backflow into the pharynx Cervical Esophageal Phase - Solids Puree: Reduced cricopharyngeal relaxation Mechanical Soft: Reduced cricopharyngeal relaxation Cervical Esophageal Phase - Comment Cervical Esophageal Comment: liquid boluses flow around what  appears to be Zenker's diverticulum - radiologist not present to  confirm findings, did not provide pt with barium tablet due to  concern for it to become lodged in Zenker's diverticulum,  appearance of slow clearance throughout esophagus with ? tertiary  contractions, liquids facilitate clearance         Luanna Salk, MS [facility] SLP [phone]    Ct Maxillofacial Wo Cm  01/05/2014   CLINICAL DATA:  Status post fall  EXAM: CT HEAD WITHOUT CONTRAST  CT MAXILLOFACIAL WITHOUT CONTRAST  CT CERVICAL SPINE WITHOUT CONTRAST  TECHNIQUE: Multidetector CT imaging of the head, cervical spine, and maxillofacial structures were performed using the standard protocol without intravenous contrast. Multiplanar CT image reconstructions of the cervical spine and maxillofacial structures were also generated.  COMPARISON:  None.  FINDINGS: CT HEAD FINDINGS  There is no evidence of mass effect, midline shift, or extra-axial fluid collections. There is no evidence of a space-occupying lesion or intracranial hemorrhage. There is no evidence of a cortical-based area of acute infarction. There is generalized cerebral atrophy. There is periventricular white matter low attenuation likely secondary to microangiopathy.  The ventricles and sulci are appropriate for the patient's age. The basal cisterns are patent.  Visualized portions of the orbits are unremarkable. The mastoid air cells are clear. There is an air-fluid level in the right sphenoid  sinus. Bilateral  ethmoid sinus mucosal thickening. Cerebrovascular atherosclerotic calcifications are noted.  The osseous structures are unremarkable. Severe right facial and right periorbital soft tissue swelling and hematoma.  CT MAXILLOFACIAL FINDINGS  Severe right periorbital and right facial soft tissue swelling as well as hematoma formation. The globes are intact. The orbital walls are intact. The orbital floors are intact. The maxilla is intact. The mandible is intact. The zygomatic arches are intact. The nasal septum is midline. There is no nasal bone fracture. The temporomandibular joints are normal.  Right sphenoid sinus air-fluid level. Bilateral ethmoid sinus mucosal thickening. The visualized portions of the mastoid sinuses are well aerated.  CT CERVICAL SPINE FINDINGS  The alignment is anatomic. The vertebral body heights are maintained. There is no acute fracture. There is no static listhesis. The prevertebral soft tissues are normal. The intraspinal soft tissues are not fully imaged on this examination due to poor soft tissue contrast, but there is no gross soft tissue abnormality.  There is mild degenerative disc disease throughout the cervical spine. There are mild broad-based disc bulges at C3-4, C4-5, C5-6 and C6-7. At C3-4 there is bilateral facet arthropathy and bilateral uncovertebral degenerative change with left foraminal stenosis. At C4-5 there is bilateral facet arthropathy and uncovertebral degenerative change with bilateral foraminal stenosis, left greater than right. At C5-6 there is left uncovertebral degenerative change.  The visualized portions of the lung apices demonstrate no focal abnormality.There is bilateral carotid artery atherosclerosis.  IMPRESSION: 1. No acute intracranial pathology. 2. No acute osseous injury of the maxillofacial bones. Severe right facial soft tissue swelling and right periorbital soft tissue swelling as well as hematoma formation. 3. No acute osseous injury of the  cervical spine. 4. Cervical spine spondylosis as described above.   Electronically Signed   By: Kathreen Devoid   On: 01/05/2014 13:56    Microbiology: Recent Results (from the past 240 hour(s))  CULTURE, BLOOD (ROUTINE X 2)     Status: None   Collection Time    01/05/14  8:43 PM      Result Value Ref Range Status   Specimen Description BLOOD LEFT HAND   Final   Special Requests BOTTLES DRAWN AEROBIC ONLY 5CC   Final   Culture  Setup Time     Final   Value: 01/06/2014 09:09     Performed at [facility]   Culture     Final   Value: NO GROWTH 5 DAYS     Performed at [facility]   Report Status 01/12/2014 FINAL   Final  CULTURE, BLOOD (ROUTINE X 2)     Status: None   Collection Time    01/05/14  9:50 PM      Result Value Ref Range Status   Specimen Description BLOOD RIGHT ARM   Final   Special Requests BOTTLES DRAWN AEROBIC AND ANAEROBIC 10CC EACH   Final   Culture  Setup Time     Final   Value: 01/06/2014 09:10     Performed at [facility]   Culture     Final   Value: NO GROWTH 5 DAYS     Performed at [facility]   Report Status 01/12/2014 FINAL   Final  URINE CULTURE     Status: None   Collection Time    01/06/14  7:00 AM      Result Value Ref Range Status   Specimen Description URINE, CLEAN CATCH   Final   Special Requests  NONE   Final   Culture  Setup Time     Final   Value: 01/06/2014 12:53     Performed at [place]     Final   Value: 60,000 COLONIES/ML     Performed at [facility]   Culture     Final   Value: Multiple bacterial morphotypes present, none predominant. Suggest appropriate recollection if clinically indicated.     Performed at [facility]   Report Status 01/07/2014 FINAL   Final     Labs: Basic Metabolic Panel:  Recent Labs Lab 01/09/14 0345 01/10/14 0420 01/11/14 0355 01/12/14 0330 01/13/14 0430  NA 141 141 142 144 144  K 3.4* 3.4* 3.4* 3.4* 3.0*  CL 101 96 98  102 102  CO2 31 32 31 31 27   GLUCOSE 134* 171* 147* 134* 136*  BUN 20 18 16 23 17   CREATININE 0.43* 0.47* 0.49* 0.54 0.50  CALCIUM 8.7 9.2 9.1 8.8 8.7   Liver Function Tests:  Recent Labs Lab 01/13/14 0430  AST 24  ALT 22  ALKPHOS 47  BILITOT 2.2*  PROT 5.9*  ALBUMIN 2.9*   No results found for this basename: LIPASE, AMYLASE,  in the last 168 hours No results found for this basename: AMMONIA,  in the last 168 hours CBC:  Recent Labs Lab 01/09/14 0345 01/10/14 0420 01/11/14 0355 01/12/14 0330 01/13/14 0430  WBC 11.6* 11.5* 14.2* 12.2* 10.9*  HGB 10.2* 11.3* 11.3* 11.2* 12.0  HCT 31.2* 34.2* 34.5* 34.5* 36.6  MCV 92.3 92.4 91.5 90.3 91.5  PLT 117* 169 224 234 227   Cardiac Enzymes: No results found for this basename: CKTOTAL, CKMB, CKMBINDEX, TROPONINI,  in the last 168 hours BNP: BNP (last 3 results)  Recent Labs  01/05/14 2023  PROBNP 4533.0*   CBG: No results found for this basename: GLUCAP,  in the last 168 hours     Signed:  Geradine Girt  [facility] 01/13/2014, 11:24 AM

## 2014-01-13 NOTE — Progress Notes (Signed)
Clinical Social Worker facilitated patient discharge including contacting patient, family and facility to confirm patient discharge plans.  Clinical information faxed to facility and family agreeable with plan.  CSW arranged ambulance transport via [facility] to [facility] for [employer] to call report prior to discharge.  Clinical Social Worker will sign off for now as social work intervention is no longer needed. Please consult [place] again if new need arises.  Jeanette Caprice, MSW, [place]

## 2014-01-13 NOTE — Progress Notes (Signed)
Physical Therapy Treatment Patient Details Name: Erica [name] MRN: [id number] DOB: [birth year] Today's Date: 01/13/2014    History of Present Illness Pt admitted after fall at home with significant right sided face and thigh hematoma without fx. Pt stayed down 4 hrs until family arrived to visit because she didnt' want to call and bother anyone despite presence of life alert. CT of head negative and all xrays of hip and face negative for fx Pt with significant AMS (not recalling family members, reason for hospitalization)--previously very sharp-minded & living alone per family. Neuro consult favors AMS due to infection/pneumonia (vs ?TBI). Pt with hx of non-small cell lung cancer status post right upper lobe lobectomy, COPD, hypothyroidism, and hyperlipidemia    PT Comments    Pt admitted with above. Pt currently with functional limitations due to balance and endurance deficits.  Pt will benefit from skilled PT to increase their independence and safety with mobility to allow discharge to the venue listed below.   Follow Up Recommendations  SNF     Equipment Recommendations  Rolling walker with 5" wheels    Recommendations for Other Services Speech consult     Precautions / Restrictions Precautions Precautions: Fall Restrictions Weight Bearing Restrictions: No    Mobility  Bed Mobility Overal bed mobility: Needs Assistance Bed Mobility: Supine to Sit Rolling: Mod assist;+2 for physical assistance   Supine to sit: Mod assist;+2 for physical assistance     General bed mobility comments: requires (A) to progress trunk to full upright posture. pt immediately attmepting return to supine.   Transfers Overall transfer level: Needs assistance Equipment used: 1 person hand held assist Transfers: Sit to/from [facility] Sit to Stand: Mod assist;+2 physical assistance Stand pivot transfers: Mod assist;+2 physical assistance       General transfer comment: pt with  hand held (A) to transfer eob to recliner.  Ambulation/Gait Ambulation/Gait assistance: Mod assist;+2 physical assistance Ambulation Distance (Feet): 20 Feet Assistive device: 2 person hand held assist Gait Pattern/deviations: Step-through pattern;Decreased stride length;Narrow base of support;Trunk flexed   Gait velocity interpretation: Below normal speed for age/gender General Gait Details: HR 113 at rest and 130 bpm with ambulation in room.  Pt very unsteady needing 2 person HHA to ambulate short distance.  Pt with DOE 3/4.  Pt slow gait overall.  Confusion makes pt more unsafe and more unsteady.     Stairs            Wheelchair Mobility    Modified Rankin (Stroke Patients Only)       Balance Overall balance assessment: Needs assistance;History of Falls Sitting-balance support: Bilateral upper extremity supported;Feet supported Sitting balance-Leahy Scale: Zero Sitting balance - Comments: requiring PT assist throughout.  Could not support herself.   Postural control: Posterior lean Standing balance support: Bilateral upper extremity supported;During functional activity Standing balance-Leahy Scale: Poor Standing balance comment: Pt needs bil UE support for balance.                      Cognition Arousal/Alertness: Awake/alert Behavior During Therapy: Restless Overall Cognitive Status: Impaired/Different from baseline Area of Impairment: Orientation;Attention;Following commands;Safety/judgement;[place] level Orientation Level: Disoriented to;Person;Place;Time;Situation Current Attention Level: Focused Memory: Decreased short-term memory Following Commands: Follows one step commands inconsistently;Follows one step commands with increased time Safety/Judgement: Decreased awareness of deficits Awareness: Intellectual Problem Solving: Slow processing;Decreased initiation;Difficulty sequencing      Exercises      General Comments        Pertinent  Vitals/Pain HR 113-130 bpm, sore all over per pt    Home Living                      Prior Function            PT Goals (current goals can now be found in the care plan section) Progress towards PT goals: Progressing toward goals    Frequency  Min 2X/week    PT Plan Current plan remains appropriate    Co-evaluation             End of Session Equipment Utilized During Treatment: Gait belt;Oxygen Activity Tolerance: Patient limited by fatigue Patient left: in chair;with call bell/phone within reach;with chair alarm set;with family/visitor present     Time: 0383-3383 PT Time Calculation (min): 12 min  Charges:  $Therapeutic Activity: 8-22 mins                    G Codes:      Christianne Dolin Jan 16, 2014, 1:41 PM [facility] [phone] (pager)

## 2014-01-15 ENCOUNTER — Non-Acute Institutional Stay (SKILLED_NURSING_FACILITY): Payer: Medicare Other | Admitting: Internal Medicine

## 2014-01-15 DIAGNOSIS — R404 Transient alteration of awareness: Secondary | ICD-10-CM

## 2014-01-15 DIAGNOSIS — I4891 Unspecified atrial fibrillation: Secondary | ICD-10-CM

## 2014-01-15 DIAGNOSIS — N390 Urinary tract infection, site not specified: Secondary | ICD-10-CM

## 2014-01-15 DIAGNOSIS — R410 Disorientation, unspecified: Secondary | ICD-10-CM

## 2014-01-15 NOTE — Progress Notes (Signed)
Patient ID: Erica [name], female   DOB: February 26, 1921, 80 y.o.   MRN: [id number]  Facility; [facility] Chief complaint; admission to SNF post admit to [facility] from 5/31 to 6/8 noted  History; this is a 42-year-old woman who was living at home on her own. Per her family she was independent with ADLs and IADLs was beginning to have some mild memory problems one of her daughters had taken over financial management but nevertheless was well enough to cook Sunday meal of her friends and family. She was found having fallen apparently outside her home. She had trauma to her face with significant soft tissue damage. There may have been a lag of 4 hours before she came to the emergency department to. She was apparently alert in the emergency room. She was noted to be in atrial fibrillation with rapid ventricular response she was treated with IV diltiazem. CT scan of the face revealed no fractures of the orbit or maxilla or zygoma but showed a large right facial hematoma and swelling. CT scan of the brain was negative. CT scan of cervical spine was negative for. Her potassium was 3.3 platelet count was 65,000 INR was 3.81 the patient had been on Coumadin. X-rays of her left and right hips were negative.  Apparently by the next day the patient was noted to become increasingly confused. She was noted by the hospitalist to have delirium. She was seen by neurology and felt to have a concussion. She developed a fever and was treated with IV antibiotics for 8 days. An echocardiogram showed mild to moderate aortic stenosis with a valve area of 0.96 cm. Her PA pressure was 43 left ventricular ejection fraction 55% the right ventricular systolic function was mildly reduced. She apparently was restless and confused per her family throughout the rest of her hospital stay and so far during her 48 hours in the nursing home she has had fluctuating levels of alertness but today is only minimally responsive. More concerning than  this she has not had anything to drink in at least 48 hours and is not eating anything either.  She had a chest x-ray done portably last night which was 2 views . The report was a large focal area of consolidation posterior to the heart seen on the lateral view only. A large hiatal hernia vs. pneumonia was raised as the differential. I have all looked over her to portable films in the hospital, the last one was on 6/5 this showed an interstitial prominence I think it was felt to be congestive heart failure and she was put on Lasix the. There was certainly no retrocardiac density although she did have some atelectasis  Past Medical History  Diagnosis Date  . Lung cancer 09-11-2007    Dr Arlyce Dice did surgery; Dr. Inda Merlin at cancer ctr  . Allergic rhinitis   . Atrial fibrillation   . Hyperlipidemia   . Asthma   . Dysrhythmia     ATRIAL FIBRILATION   Past Surgical History  Procedure Laterality Date  . Inguinal hernia repair      right  . Thyroid surgery      nodule resection  . Lung lobectomy  09-11-2007    right upper lobe  . Hemorroidectomy     Current Outpatient Prescriptions on File Prior to Visit  Medication Sig Dispense Refill  . acetaminophen (TYLENOL) 500 MG tablet Take 500 mg by mouth every 6 (six) hours as needed for mild pain.       [name]  atorvastatin (LIPITOR) 10 MG tablet Take 10 mg by mouth daily.      . budesonide-formoterol (SYMBICORT) 160-4.5 MCG/ACT inhaler Inhale 2 puffs into the lungs 2 (two) times daily.      . divalproex (DEPAKOTE SPRINKLE) 125 MG capsule Take 2 capsules (250 mg total) by mouth every 12 (twelve) hours.      . fluconazole (DIFLUCAN) 100 MG tablet Take 1 tablet (100 mg total) by mouth daily.      . folic acid (FOLVITE) 397 MCG tablet Take 400 mcg by mouth daily.        . furosemide (LASIX) 40 MG tablet Take 1 tablet (40 mg total) by mouth daily.  30 tablet    . guaifenesin (ROBITUSSIN) 100 MG/5ML syrup Take 10 mLs (200 mg total) by mouth every 4 (four) hours  as needed for congestion.  120 mL  0  . haloperidol (HALDOL) 0.5 MG tablet Take 1 tablet (0.5 mg total) by mouth every 6 (six) hours as needed for agitation (restlessness).      [name] ipratropium-albuterol (DUONEB) 0.5-2.5 (3) MG/3ML SOLN Take 3 mLs by nebulization 3 (three) times daily.  360 mL    . ipratropium-albuterol (DUONEB) 0.5-2.5 (3) MG/3ML SOLN Take 3 mLs by nebulization every 4 (four) hours as needed.  360 mL    . levothyroxine (SYNTHROID, LEVOTHROID) 75 MCG tablet Take 75 mcg by mouth daily before breakfast.      . metoprolol tartrate (LOPRESSOR) 12.5 mg TABS tablet Take 1.5 tablets (37.5 mg total) by mouth 2 (two) times daily.      [name] nystatin (MYCOSTATIN) 100000 UNIT/ML suspension Take 5 mLs (500,000 Units total) by mouth 4 (four) times daily.  60 mL  0  . omega-3 acid ethyl esters (LOVAZA) 1 G capsule Take 2 capsules (2 g total) by mouth daily.      [name] omeprazole (PRILOSEC) 20 MG capsule Take 20 mg by mouth daily.      Vladimir Faster Glycol-Propyl Glycol (SYSTANE) 0.4-0.3 % SOLN Place 1 drop into both eyes 2 (two) times daily.        . potassium chloride SA (K-DUR,KLOR-CON) 20 MEQ tablet Take 2 tablets (40 mEq total) by mouth 2 (two) times daily.      . predniSONE (DELTASONE) 10 MG tablet 30 mg x 3 days, 20 mg x 3 days, 10 mg x 3 days, 5 mg x 3 days      . TRAVATAN Z 0.004 % SOLN ophthalmic solution Place 1 drop into both eyes at bedtime.                       Jolayne Haines, MD        Social; as noted above the patient was very functional in her on home. No prior history of falls. She is a DO NOT RESUSCITATE  Family history; not relevant to the patient's current situation  Review of systems; Not really possible from the patient that she is too delirious state and carry on a conversation. Staff reports she is eating and drinking virtually nothing in 48 hours. Family states that she mainly has known some family members in the last 1 hours but not today  Physical examination Gen.  frail elderly woman in no distress. Large old bruising with a hematoma over right Zygoma. There is also extensive bruising over the right hip down into the right thigh. She is restless agitated Vital signs; she is afebrile O2 sat is 93% respirations 28 Cheyne-Stokes pulse  rate varying between 95 and 110 probably atrial fib HEENT pupils are equal and reactive. Oral mucosa is dry Respiratory shallow but otherwise clear entry bilaterally there are no wheezes Cardiac heart sounds are normal no murmurs no S3 if anything she is probably somewhat volume contracted Abdomen/ GU mildly distended bowel sounds are positive there is lower abdominal tenderness both right lower and left lower as well his midline quadrants there is no guarding or rebound. No evidence of bladder distention possible right CVA tenderness Neurologic; the patient is not responsive to voice, is able to move to noxious stimuli. Reflexes are brisk in the upper extremities not obtained at the knee and ankle jerks are. I suspect the left Babinski response is extensor right is equivocal but probably flexor. Mental status obvious continued delivery  Impression/plan #1 significant continued delirium in a frail elderly woman who had blunt head trauma. Positive this is difficult to determine however I suspect she probably has pyelonephritis. The finding of consolidation on the lateral view of the portable two-view x-ray in a nursing facility is certainly possible for pneumonia and/or hiatal hernia. I did review these portable films and [facility] length there was no evidence of an  obvious hiatal hernia however there were no lateral views. In any case the patient is probably volume contracted with poor urine output. Further she has not eaten anything in 48-hours. I've discussed the issue of how much support to give this patient with her daughter and her granddaughter who are present. They have agreed to IV fluids antibiotics. Nutritional support would  dictate sending her back to the hospital. At lab work has been sent #2 possible urinary tract infection urinalysis shows large leukocytes, small blood large bilirubin #3 probably well underway to prerenal azotemia #4 atrial fibrillation this is still present although she is not discharged on any thing to control her heart rate. Coumadin was discontinued during her hospitalization #5 oral thrush discharged on fluid for Diflucan. I think this can probably stop #6 possible COPD  This is going to be a very difficult situation. She will need support both in terms of IV fluids and quite soon nutritional support . i Think the Lasix can be put on Holter. She will need broad-spectrum antibiotics.

## 2014-01-20 ENCOUNTER — Telehealth: Payer: Self-pay | Admitting: Cardiology

## 2014-01-20 NOTE — Telephone Encounter (Signed)
Will forward to Dr Percival Spanish for his information

## 2014-01-20 NOTE — Telephone Encounter (Signed)
She called to inform you that patient expired on Friday,6- 12-15.

## 2014-01-20 NOTE — Telephone Encounter (Signed)
Message forwarded to Kelli Churn, RN

## 2014-02-05 DEATH — deceased

## 2015-06-07 IMAGING — CT CT CERVICAL SPINE W/O CM
2 of 13 series · 5 of 33 positions shown, 6 images · non-contrast
Comparison: None.

CLINICAL DATA: Status post fall

EXAM:
CT HEAD WITHOUT CONTRAST
CT MAXILLOFACIAL WITHOUT CONTRAST
CT CERVICAL SPINE WITHOUT CONTRAST
TECHNIQUE: Multidetector CT imaging of the head, cervical spine, and
maxillofacial structures were performed using the standard protocol
without intravenous contrast. Multiplanar CT image reconstructions
of the cervical spine and maxillofacial structures were also
generated.

[Series 13: c_spine 2.0 i40s 3 · axial · 0.24mm/px · z∈[-343,-147]mm · 3 of 99 slices shown, 4 images]
[im 1/99  soft-tissue]
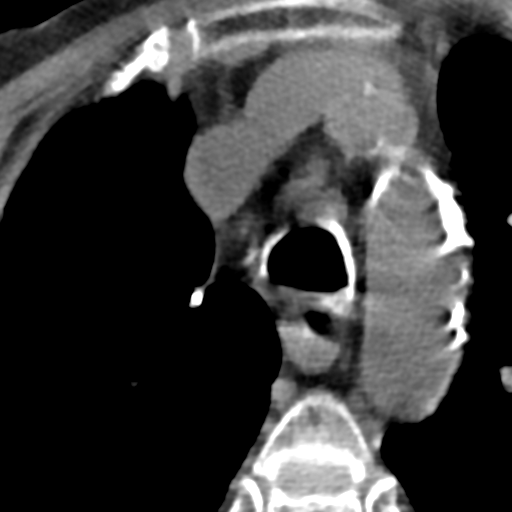
[im 1/99  bone]
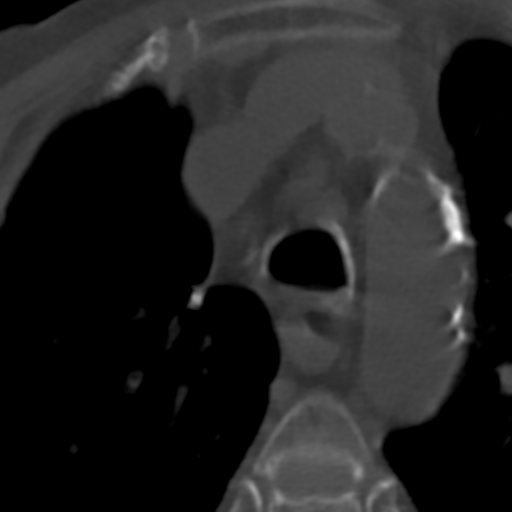
[im 50/99  bone]
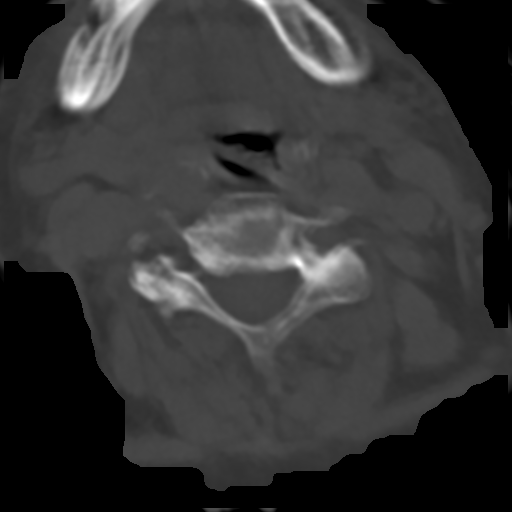
[im 99/99  bone]
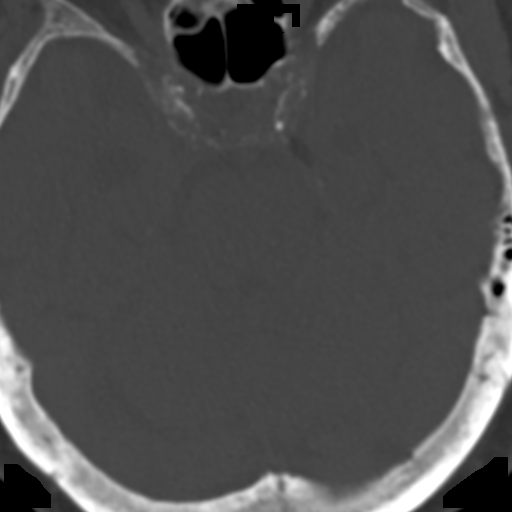

[Series 16: sagittals 1 · sagittal · 0.18mm/px · 2 of 61 slices shown]
[im 21/61  bone]
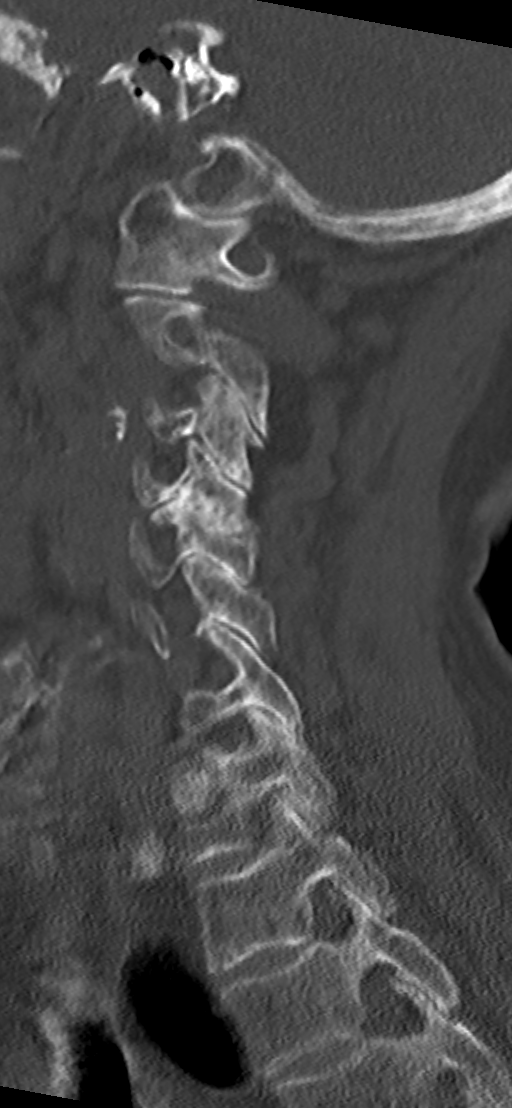
[im 41/61  bone]
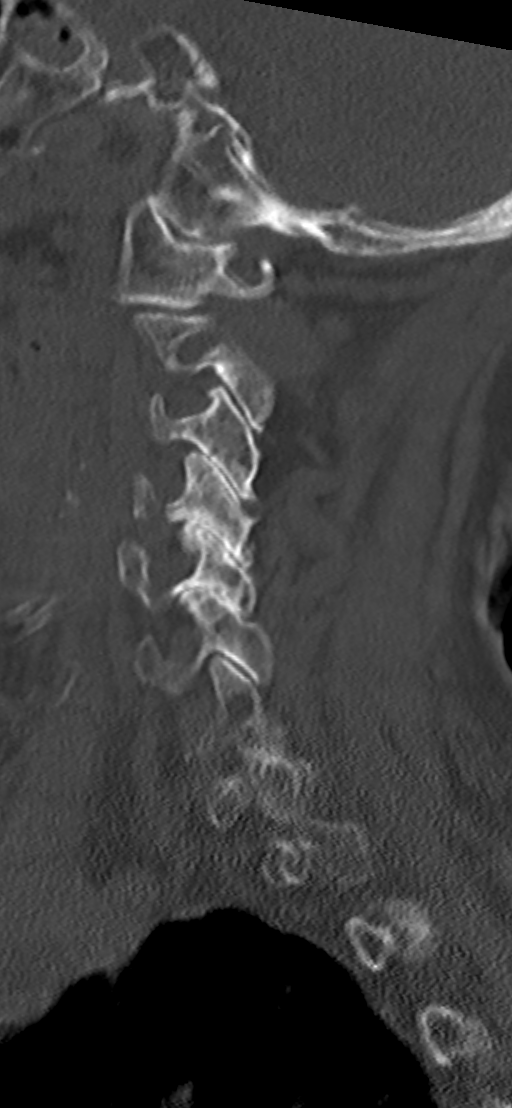

[5 of 33 positions shown; findings below may reference images not displayed]

FINDINGS: CT HEAD FINDINGS

There is no evidence of mass effect, midline shift, or extra-axial
fluid collections. There is no evidence of a space-occupying lesion
or intracranial hemorrhage. There is no evidence of a cortical-based
area of acute infarction. There is generalized cerebral atrophy.
There is periventricular white matter low attenuation likely
secondary to microangiopathy.

The ventricles and sulci are appropriate for the patient's age. The
basal cisterns are patent.

Visualized portions of the orbits are unremarkable. The mastoid air
cells are clear. There is an air-fluid level in the right sphenoid
sinus. Bilateral ethmoid sinus mucosal thickening. Cerebrovascular
atherosclerotic calcifications are noted.

The osseous structures are unremarkable. Severe right facial and
right periorbital soft tissue swelling and hematoma.

CT MAXILLOFACIAL FINDINGS

Severe right periorbital and right facial soft tissue swelling as
well as hematoma formation. The globes are intact. The orbital walls
are intact. The orbital floors are intact. The maxilla is intact.
The mandible is intact. The zygomatic arches are intact. The nasal
septum is midline. There is no nasal bone fracture. The
temporomandibular joints are normal.

Right sphenoid sinus air-fluid level. Bilateral ethmoid sinus
mucosal thickening. The visualized portions of the mastoid sinuses
are well aerated.

CT CERVICAL SPINE FINDINGS

The alignment is anatomic. The vertebral body heights are
maintained. There is no acute fracture. There is no static
listhesis. The prevertebral soft tissues are normal. The intraspinal
soft tissues are not fully imaged on this examination due to poor
soft tissue contrast, but there is no gross soft tissue abnormality.

There is mild degenerative disc disease throughout the cervical
spine. There are mild broad-based disc bulges at C3-4, C4-5, C5-6
and C6-7. At C3-4 there is bilateral facet arthropathy and bilateral
uncovertebral degenerative change with left foraminal stenosis. At
C4-5 there is bilateral facet arthropathy and uncovertebral
degenerative change with bilateral foraminal stenosis, left greater
than right. At C5-6 there is left uncovertebral degenerative change.

The visualized portions of the lung apices demonstrate no focal
abnormality.There is bilateral carotid artery atherosclerosis.
IMPRESSION: 1. No acute intracranial pathology.
2. No acute osseous injury of the maxillofacial bones. Severe right
facial soft tissue swelling and right periorbital soft tissue
swelling as well as hematoma formation.
3. No acute osseous injury of the cervical spine.
4. Cervical spine spondylosis as described above.

## 2015-06-10 IMAGING — CT CT HEAD W/O CM
1 series · 16 of 29 positions shown, 20 images · non-contrast
Comparison: 01/05/2014

CLINICAL DATA: AMS r/o CVA

EXAM:
CT HEAD WITHOUT CONTRAST
TECHNIQUE: Contiguous axial images were obtained from the base of the skull
through the vertex without intravenous contrast.

[Series 2: head 5.0 h30s · axial · 0.42mm/px · z∈[-147,-17]mm · 16 of 29 slices shown, 20 images]
[im 2/29  brain]
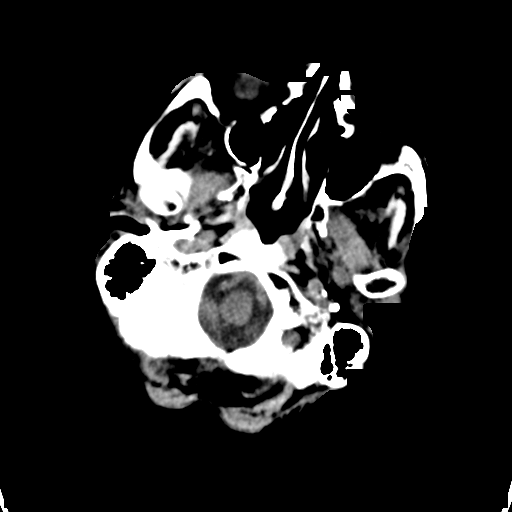
[im 2/29  bone]
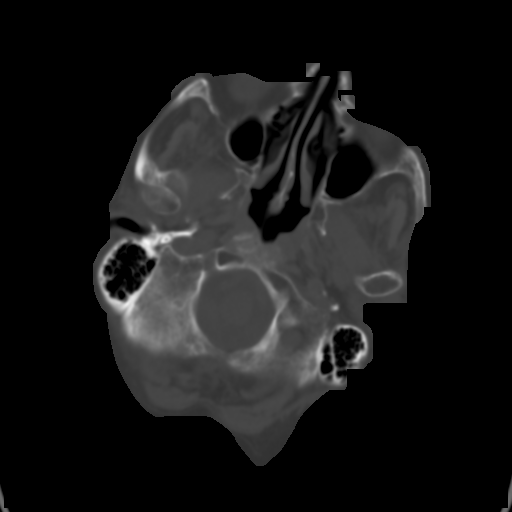
[im 4/29  brain]
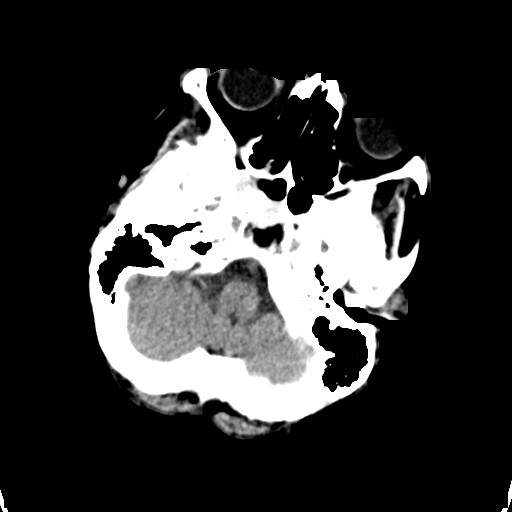
[im 6/29  brain]
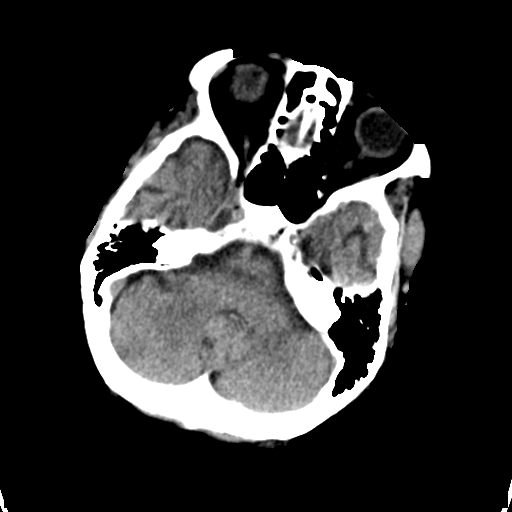
[im 7/29  brain]
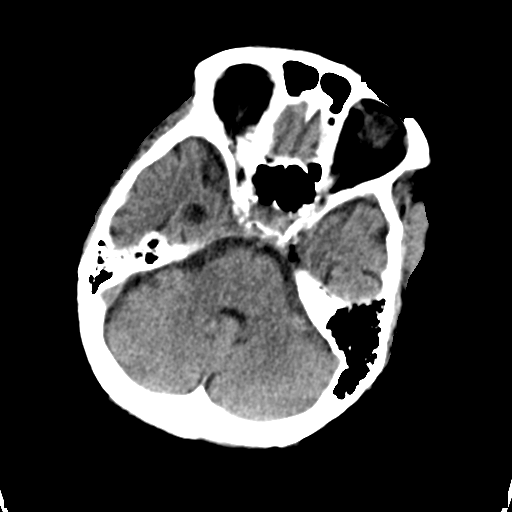
[im 9/29  brain]
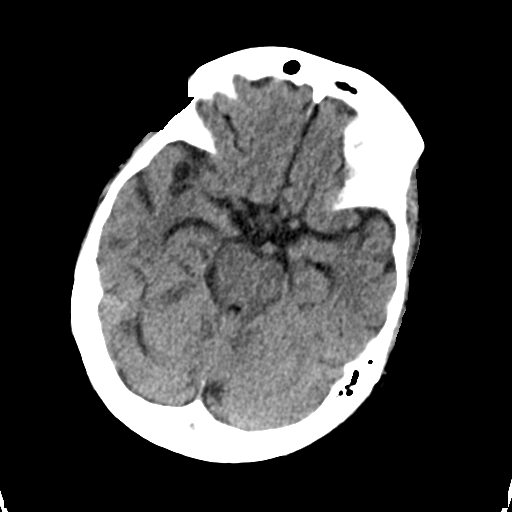
[im 9/29  bone]
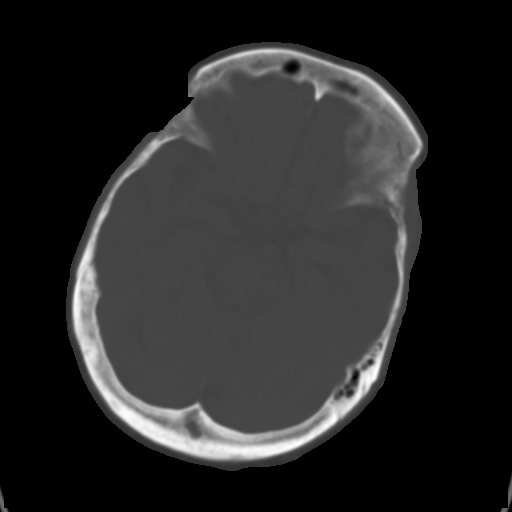
[im 11/29  brain]
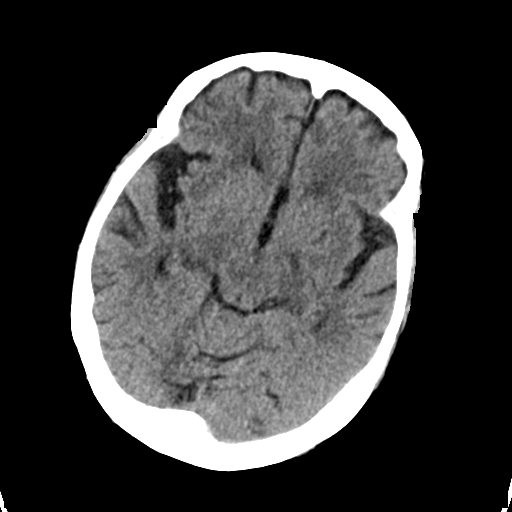
[im 12/29  brain]
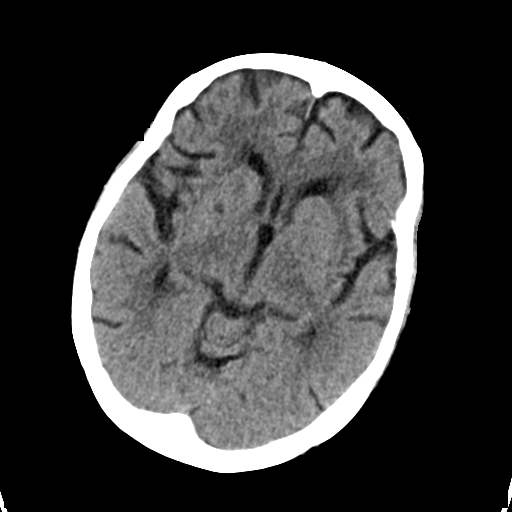
[im 14/29  brain]
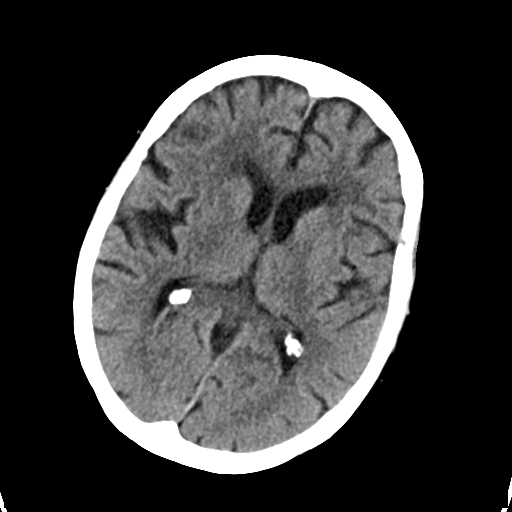
[im 16/29  brain]
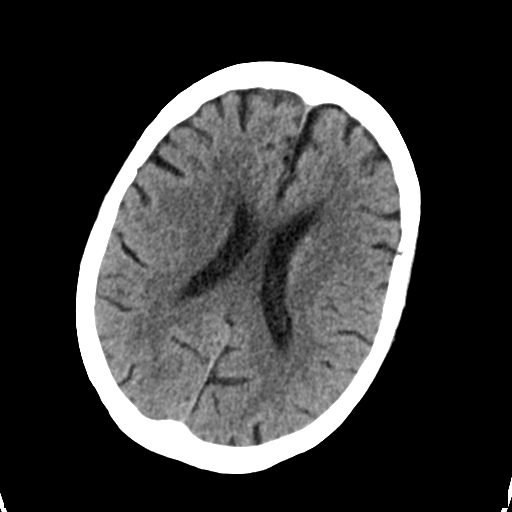
[im 16/29  bone]
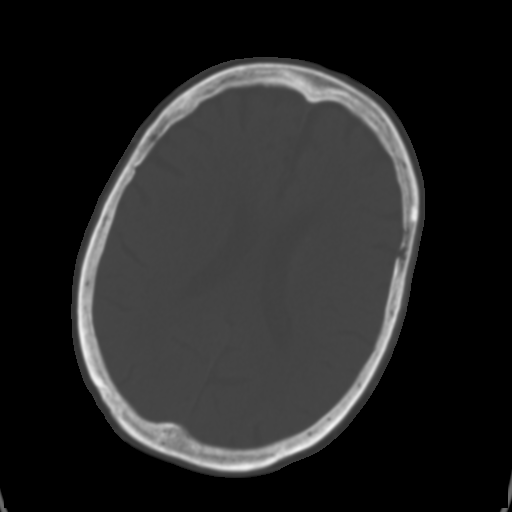
[im 18/29  brain]
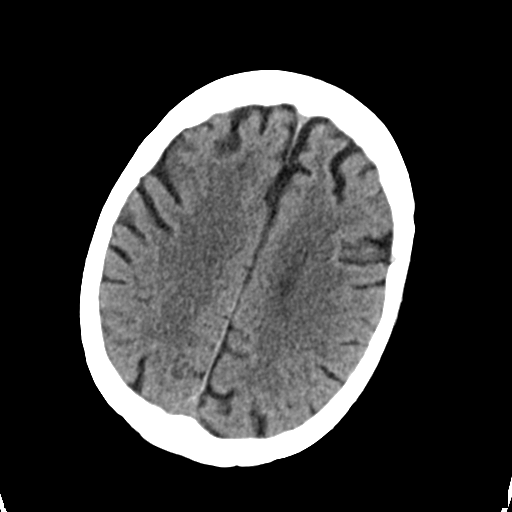
[im 19/29  brain]
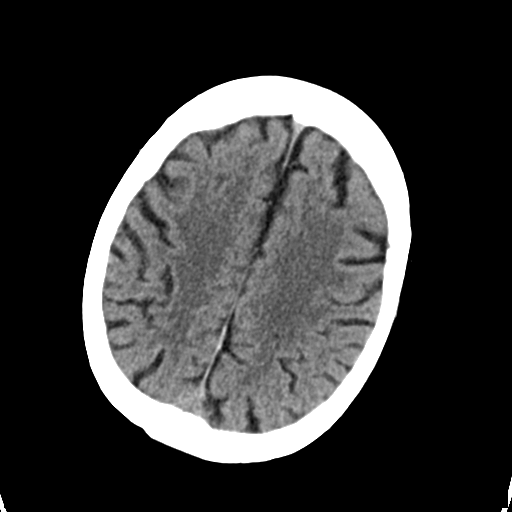
[im 21/29  brain]
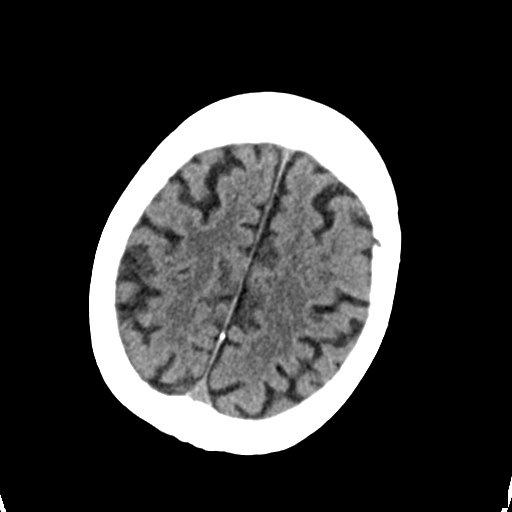
[im 23/29  brain]
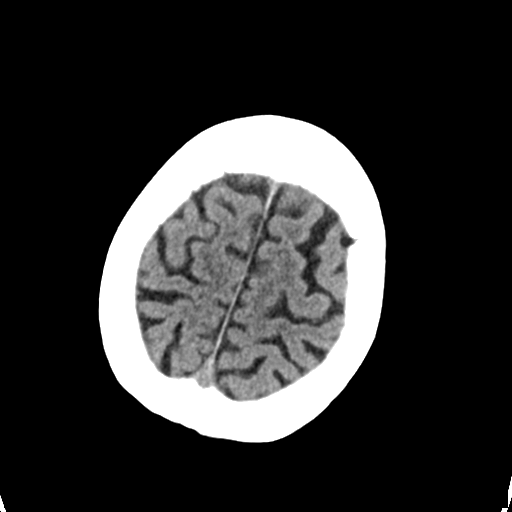
[im 23/29  bone]
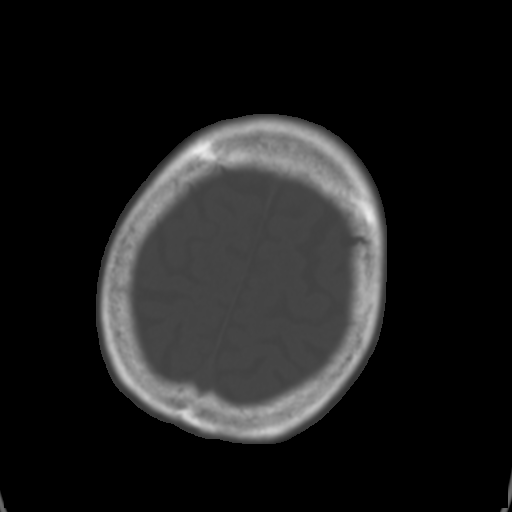
[im 24/29  brain]
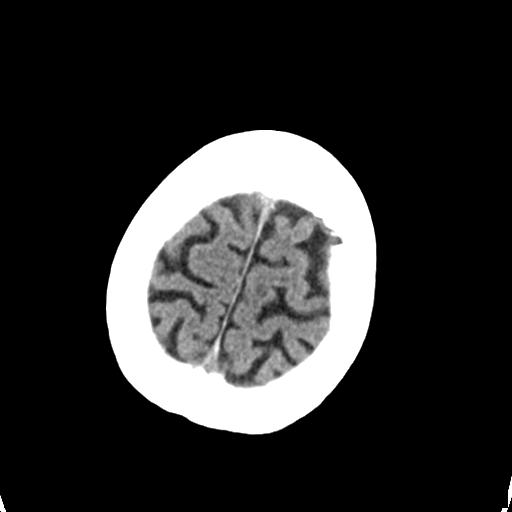
[im 26/29  brain]
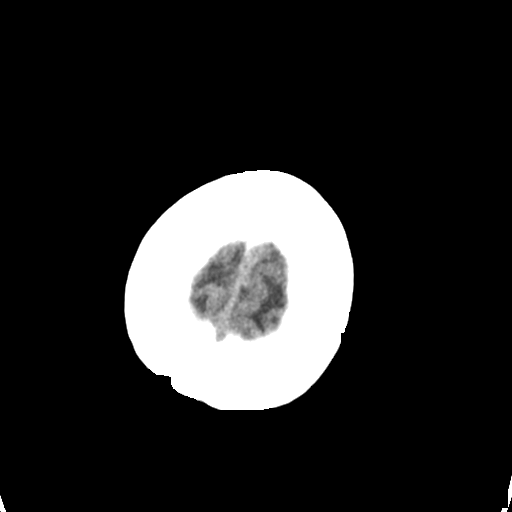
[im 28/29  brain]
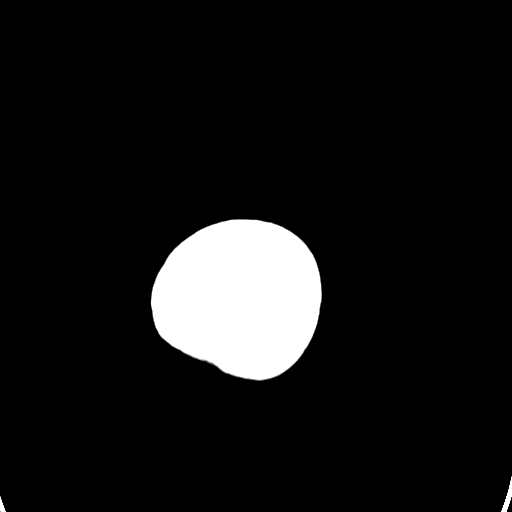

[16 of 29 positions shown; findings below may reference images not displayed]

FINDINGS: Atherosclerotic and physiologic intracranial calcifications. There
is fluid level in the sphenoid sinus as before. Stable small right
basal ganglia lacunar infarct. Diffuse parenchymal atrophy. Patchy
areas of hypoattenuation in deep and periventricular white matter
bilaterally. Negative for acute intracranial hemorrhage, mass
lesion, acute infarction, midline shift, or mass-effect. Acute
infarct may be inapparent on noncontrast CT. Ventricles and sulci
symmetric. Bone windows demonstrate no focal lesion.
IMPRESSION: 1. Negative for bleed or other acute intracranial process.
2. Sphenoid sinus disease as before.
